# Patient Record
Sex: Female | Born: 1945 | Race: Black or African American | Hispanic: No | Smoking: Never smoker
Health system: Southern US, Community
[De-identification: ages and names within clinical notes are randomized; demographics above are authoritative.]

## PROBLEM LIST (undated history)

## (undated) DIAGNOSIS — R000 Tachycardia, unspecified: Secondary | ICD-10-CM

## (undated) DIAGNOSIS — E785 Hyperlipidemia, unspecified: Secondary | ICD-10-CM

## (undated) DIAGNOSIS — Z789 Other specified health status: Secondary | ICD-10-CM

## (undated) DIAGNOSIS — H9319 Tinnitus, unspecified ear: Secondary | ICD-10-CM

## (undated) DIAGNOSIS — F4310 Post-traumatic stress disorder, unspecified: Secondary | ICD-10-CM

## (undated) DIAGNOSIS — I10 Essential (primary) hypertension: Secondary | ICD-10-CM

## (undated) DIAGNOSIS — M8580 Other specified disorders of bone density and structure, unspecified site: Secondary | ICD-10-CM

## (undated) DIAGNOSIS — M1990 Unspecified osteoarthritis, unspecified site: Secondary | ICD-10-CM

## (undated) HISTORY — DX: Hyperlipidemia, unspecified: E78.5

## (undated) HISTORY — PX: BUNIONECTOMY: SHX129

## (undated) HISTORY — PX: ABDOMINAL HYSTERECTOMY: SHX81

## (undated) HISTORY — PX: JOINT REPLACEMENT: SHX530

## (undated) HISTORY — PX: CHOLECYSTECTOMY: SHX55

## (undated) HISTORY — DX: Post-traumatic stress disorder, unspecified: F43.10

---

## 1998-04-07 ENCOUNTER — Encounter: Payer: Self-pay | Admitting: Obstetrics and Gynecology

## 1998-04-07 ENCOUNTER — Other Ambulatory Visit: Admission: RE | Admit: 1998-04-07 | Discharge: 1998-04-07 | Payer: Self-pay | Admitting: Obstetrics and Gynecology

## 1998-04-07 ENCOUNTER — Ambulatory Visit (HOSPITAL_COMMUNITY): Admission: RE | Admit: 1998-04-07 | Discharge: 1998-04-07 | Payer: Self-pay | Admitting: *Deleted

## 1998-05-07 ENCOUNTER — Ambulatory Visit (HOSPITAL_COMMUNITY): Admission: RE | Admit: 1998-05-07 | Discharge: 1998-05-07 | Payer: Self-pay | Admitting: Obstetrics and Gynecology

## 1999-03-02 ENCOUNTER — Encounter: Payer: Self-pay | Admitting: Obstetrics and Gynecology

## 1999-03-07 ENCOUNTER — Encounter (INDEPENDENT_AMBULATORY_CARE_PROVIDER_SITE_OTHER): Payer: Self-pay

## 1999-03-07 ENCOUNTER — Inpatient Hospital Stay (HOSPITAL_COMMUNITY): Admission: RE | Admit: 1999-03-07 | Discharge: 1999-03-09 | Payer: Self-pay | Admitting: Obstetrics and Gynecology

## 1999-04-18 ENCOUNTER — Encounter: Payer: Self-pay | Admitting: Obstetrics and Gynecology

## 1999-04-18 ENCOUNTER — Ambulatory Visit (HOSPITAL_COMMUNITY): Admission: RE | Admit: 1999-04-18 | Discharge: 1999-04-18 | Payer: Self-pay | Admitting: Obstetrics and Gynecology

## 2000-05-13 ENCOUNTER — Ambulatory Visit (HOSPITAL_COMMUNITY): Admission: RE | Admit: 2000-05-13 | Discharge: 2000-05-13 | Payer: Self-pay | Admitting: Obstetrics and Gynecology

## 2000-05-13 ENCOUNTER — Encounter: Payer: Self-pay | Admitting: Obstetrics and Gynecology

## 2001-01-13 ENCOUNTER — Encounter (INDEPENDENT_AMBULATORY_CARE_PROVIDER_SITE_OTHER): Payer: Self-pay

## 2001-01-13 ENCOUNTER — Ambulatory Visit (HOSPITAL_COMMUNITY): Admission: RE | Admit: 2001-01-13 | Discharge: 2001-01-13 | Payer: Self-pay | Admitting: Gastroenterology

## 2001-05-15 ENCOUNTER — Encounter: Payer: Self-pay | Admitting: Obstetrics and Gynecology

## 2001-05-15 ENCOUNTER — Ambulatory Visit (HOSPITAL_COMMUNITY): Admission: RE | Admit: 2001-05-15 | Discharge: 2001-05-15 | Payer: Self-pay | Admitting: Obstetrics and Gynecology

## 2001-09-17 ENCOUNTER — Other Ambulatory Visit: Admission: RE | Admit: 2001-09-17 | Discharge: 2001-09-17 | Payer: Self-pay | Admitting: Obstetrics and Gynecology

## 2002-08-26 ENCOUNTER — Ambulatory Visit (HOSPITAL_COMMUNITY): Admission: RE | Admit: 2002-08-26 | Discharge: 2002-08-26 | Payer: Self-pay | Admitting: Obstetrics and Gynecology

## 2002-08-26 ENCOUNTER — Encounter: Payer: Self-pay | Admitting: Obstetrics and Gynecology

## 2002-09-21 ENCOUNTER — Other Ambulatory Visit: Admission: RE | Admit: 2002-09-21 | Discharge: 2002-09-21 | Payer: Self-pay | Admitting: Obstetrics and Gynecology

## 2003-09-21 ENCOUNTER — Ambulatory Visit (HOSPITAL_COMMUNITY): Admission: RE | Admit: 2003-09-21 | Discharge: 2003-09-21 | Payer: Self-pay | Admitting: Obstetrics and Gynecology

## 2003-10-01 ENCOUNTER — Other Ambulatory Visit: Admission: RE | Admit: 2003-10-01 | Discharge: 2003-10-01 | Payer: Self-pay | Admitting: Obstetrics and Gynecology

## 2004-10-05 ENCOUNTER — Ambulatory Visit (HOSPITAL_COMMUNITY): Admission: RE | Admit: 2004-10-05 | Discharge: 2004-10-05 | Payer: Self-pay | Admitting: Obstetrics and Gynecology

## 2005-11-14 ENCOUNTER — Ambulatory Visit (HOSPITAL_COMMUNITY): Admission: RE | Admit: 2005-11-14 | Discharge: 2005-11-14 | Payer: Self-pay | Admitting: Obstetrics and Gynecology

## 2006-11-18 ENCOUNTER — Ambulatory Visit (HOSPITAL_COMMUNITY): Admission: RE | Admit: 2006-11-18 | Discharge: 2006-11-18 | Payer: Self-pay | Admitting: Obstetrics and Gynecology

## 2007-02-17 ENCOUNTER — Encounter: Admission: RE | Admit: 2007-02-17 | Discharge: 2007-02-17 | Payer: Self-pay | Admitting: Obstetrics and Gynecology

## 2007-12-03 ENCOUNTER — Ambulatory Visit (HOSPITAL_COMMUNITY): Admission: RE | Admit: 2007-12-03 | Discharge: 2007-12-03 | Payer: Self-pay | Admitting: Obstetrics and Gynecology

## 2008-12-03 ENCOUNTER — Ambulatory Visit (HOSPITAL_COMMUNITY): Admission: RE | Admit: 2008-12-03 | Discharge: 2008-12-03 | Payer: Self-pay | Admitting: Obstetrics and Gynecology

## 2009-12-13 ENCOUNTER — Ambulatory Visit (HOSPITAL_COMMUNITY): Admission: RE | Admit: 2009-12-13 | Discharge: 2009-12-13 | Payer: Self-pay | Admitting: Obstetrics and Gynecology

## 2010-09-15 NOTE — Procedures (Signed)
[facility]  Patient:    Kimberly Mccullough, Kimberly Mccullough [facility] Visit Number: [id number] MRN: [id number]          Service Type: END Location: ENDO Attending Physician:  Orland Mustard Proc. Date: 01/13/01 Admit Date:  01/13/2001   CC:         Lenoard Aden, M.D.   Procedure Report  PROCEDURE:  Colonoscopy with coagulation of polyp.  MEDICATIONS:  Fentanyl 100 mcg, Versed 9 mg IV.  INDICATION:  Change in bowel habits in a 65 year old woman with no family history of colon cancer and is due for colonoscopy screening.  DESCRIPTION OF PROCEDURE:  The procedure had been explained to the patient and consent obtained.  With the patient in the left lateral decubitus position, a digital exam was performed, and the pediatric video colonoscope was inserted and advanced under direct visualization.  The prep was excellent.  The patient had an extremely long, tortuous colon.  Fortunately, we had the pediatric scope with the variable-resistance and eventually using abdominal pressure and position changes, we able to advance to the cecum.  The ileocecal valve and appendiceal orifice were seen.  The scope was withdrawn.  The cecum, ascending colon, hepatic flexure, transverse colon, splenic flexure, descending, and sigmoid colon were seen well upon removal.  They were free of diverticula, polyps, and any other lesions.  In the rectum, a 3 mm sessile polyp was encountered and was cauterized with the hot biopsy forceps.  No other polyps were seen.  The scope was withdrawn.  The patient tolerated the procedure well and was maintained on low-flow oxygen and pulse oximeter throughout the procedure.  ASSESSMENT: 1. Rectal polyp, cauterized. 2. Long, tortuous colon.  PLAN:  We will see back in the office in two months and depending on the pathology, may need to repeat a colonoscopy in the future. Attending Physician:  Orland Mustard DD:  01/13/01 TD:   01/13/01 Job: [id number]

## 2010-12-05 ENCOUNTER — Other Ambulatory Visit (HOSPITAL_COMMUNITY): Payer: Self-pay | Admitting: Obstetrics and Gynecology

## 2010-12-05 DIAGNOSIS — Z1231 Encounter for screening mammogram for malignant neoplasm of breast: Secondary | ICD-10-CM

## 2010-12-19 ENCOUNTER — Ambulatory Visit (HOSPITAL_COMMUNITY)
Admission: RE | Admit: 2010-12-19 | Discharge: 2010-12-19 | Disposition: A | Discharge status: Home / Self Care | Payer: BC Managed Care – PPO | Source: Ambulatory Visit | Attending: Obstetrics and Gynecology | Admitting: Obstetrics and Gynecology

## 2010-12-19 DIAGNOSIS — Z1231 Encounter for screening mammogram for malignant neoplasm of breast: Secondary | ICD-10-CM

## 2012-01-01 ENCOUNTER — Other Ambulatory Visit (HOSPITAL_COMMUNITY): Payer: Self-pay | Admitting: Obstetrics and Gynecology

## 2012-01-01 DIAGNOSIS — Z1231 Encounter for screening mammogram for malignant neoplasm of breast: Secondary | ICD-10-CM

## 2012-01-11 ENCOUNTER — Ambulatory Visit (HOSPITAL_COMMUNITY)
Admission: RE | Admit: 2012-01-11 | Discharge: 2012-01-11 | Disposition: A | Discharge status: Home / Self Care | Payer: Medicare Other | Source: Ambulatory Visit | Attending: Obstetrics and Gynecology | Admitting: Obstetrics and Gynecology

## 2012-01-11 DIAGNOSIS — Z1231 Encounter for screening mammogram for malignant neoplasm of breast: Secondary | ICD-10-CM | POA: Insufficient documentation

## 2012-01-23 DIAGNOSIS — Z23 Encounter for immunization: Secondary | ICD-10-CM

## 2012-02-21 ENCOUNTER — Ambulatory Visit: Payer: Medicare Other

## 2012-03-11 ENCOUNTER — Encounter: Payer: Self-pay | Admitting: Cardiovascular Disease

## 2012-03-11 ENCOUNTER — Ambulatory Visit (INDEPENDENT_AMBULATORY_CARE_PROVIDER_SITE_OTHER): Payer: Self-pay | Admitting: Cardiovascular Disease

## 2012-03-11 VITALS — BP 122/84 | HR 65 | Ht 70.0 in | Wt 169.0 lb

## 2012-03-11 DIAGNOSIS — I491 Atrial premature depolarization: Secondary | ICD-10-CM

## 2012-03-11 DIAGNOSIS — R002 Palpitations: Secondary | ICD-10-CM | POA: Insufficient documentation

## 2012-03-11 LAB — BASIC METABOLIC PANEL
BUN: 15 mg/dL (ref 6–23)
CO2: 21 mEq/L (ref 19–32)
Calcium: 9.3 mg/dL (ref 8.4–10.5)
Chloride: 108 mEq/L (ref 96–112)
GFR: 89.86 mL/min (ref >60.00)
Glucose, Bld: 91 mg/dL (ref 70–99)
Potassium: 4.2 mEq/L (ref 3.5–5.1)

## 2012-03-11 NOTE — Patient Instructions (Addendum)
Your physician recommends that you schedule a follow-up appointment in:  4 weeks.   Your physician has requested that you have an echocardiogram. Echocardiography is a painless test that uses sound waves to create images of your heart. It provides your doctor with information about the size and shape of your heart and how well your heart's chambers and valves are working. This procedure takes approximately one hour. There are no restrictions for this procedure.   Your physician has recommended that you wear a holter monitor. Holter monitors are medical devices that record the heart's electrical activity. Doctors most often use these monitors to diagnose arrhythmias. Arrhythmias are problems with the speed or rhythm of the heartbeat. The monitor is a small, portable device. You can wear one while you do your normal daily activities. This is usually used to diagnose what is causing palpitations/syncope (passing out).  . 

## 2012-03-11 NOTE — Progress Notes (Signed)
   History of Present Illness: 66 yo female with history of HTN, HLD, PTSD here today for new cardiology visit. She has a history of palpitations and these are worsened recently after the death of her husband. She tells me that she has been having "heart pounding" with skipped heart beats for the last 3 months. She notices this every day. She drinks 7 cups of coffee per day. She had been on Lipitor in past but had joint aches. Lipids at last check ok per pt. No chest pain or SOB. She lost her husband 3 weeks ago. He had CAD/history of CVA and was followed by Hochrein. He died suddenly. She saw him die at home and she has been under much stress.   Primary Care Physician: Joylene John Ameen Gyn: Olivia Mackie   Past Medical History  Diagnosis Date  . HTN (hypertension)   . Hyperlipidemia     Past Surgical History  Procedure Date  . Abdominal hysterectomy   . Cholecystectomy     Current Outpatient Prescriptions  Medication Sig Dispense Refill  . ALPRAZolam (XANAX) 0.25 MG tablet As needed      . meloxicam (MOBIC) 15 MG tablet As needed        No Known Allergies  History   Social History  . Marital Status: Widowed    Spouse Name: N/A    Number of Children: 3  . Years of Education: N/A   Occupational History  . Legal assistant    Social History Main Topics  . Smoking status: Never Smoker   . Smokeless tobacco: Not on file  . Alcohol Use: No  . Drug Use: No  . Sexually Active: Not on file   Other Topics Concern  . Not on file   Social History Narrative  . No narrative on file    Family History  Problem Relation Age of Onset  . Dementia Mother   . Liver disease Father   . CAD Neg Hx     Review of Systems:  As stated in the HPI and otherwise negative.   BP 122/84  Pulse 65  Ht 5\' 10"  (1.778 m)  Wt 169 lb (76.658 kg)  BMI 24.25 kg/m2  Physical Examination: General: Well developed, well nourished, NAD HEENT: OP clear, mucus membranes moist SKIN: warm, dry.  No rashes. Neuro: No focal deficits Musculoskeletal: Muscle strength 5/5 all ext Psychiatric: Mood and affect normal Neck: No JVD, no carotid bruits, no thyromegaly, no lymphadenopathy. Lungs:Clear bilaterally, no wheezes, rhonci, crackles Cardiovascular: Regular rate and rhythm with ectopy. No murmurs, gallops or rubs. Abdomen:Soft. Bowel sounds present. Non-tender.  Extremities: No lower extremity edema. Pulses are 2 + in the bilateral DP/PT.  EKG: NSR, PACs in pattern of bigeminy.   Assessment and Plan:   1. Palpitations: PACs on EKG today. This likely explains her symptoms. Will arrange echo to assess LVEF and exclude structural heart disease. Will arrange 48 hour monitor. Will check BMET and TSH today.

## 2012-03-13 ENCOUNTER — Telehealth: Payer: Self-pay | Admitting: Cardiovascular Disease

## 2012-03-13 NOTE — Telephone Encounter (Signed)
Pt rtn pat's call

## 2012-03-13 NOTE — Telephone Encounter (Signed)
Spoke with pt and reviewed recent lab results with her.  

## 2012-03-18 ENCOUNTER — Ambulatory Visit (HOSPITAL_COMMUNITY): Payer: Medicare Other | Attending: Cardiovascular Disease

## 2012-03-18 ENCOUNTER — Encounter (INDEPENDENT_AMBULATORY_CARE_PROVIDER_SITE_OTHER): Payer: Medicare Other

## 2012-03-18 DIAGNOSIS — I10 Essential (primary) hypertension: Secondary | ICD-10-CM | POA: Insufficient documentation

## 2012-03-18 DIAGNOSIS — R002 Palpitations: Secondary | ICD-10-CM | POA: Insufficient documentation

## 2012-03-18 DIAGNOSIS — I491 Atrial premature depolarization: Secondary | ICD-10-CM

## 2012-03-18 DIAGNOSIS — E785 Hyperlipidemia, unspecified: Secondary | ICD-10-CM | POA: Insufficient documentation

## 2012-03-18 DIAGNOSIS — R9431 Abnormal electrocardiogram [ECG] [EKG]: Secondary | ICD-10-CM

## 2012-03-18 NOTE — Progress Notes (Signed)
Echocardiogram performed.  

## 2012-03-20 ENCOUNTER — Telehealth: Payer: Self-pay | Admitting: Cardiovascular Disease

## 2012-03-20 NOTE — Telephone Encounter (Signed)
Spoke with pt, aware of echo results. 

## 2012-03-20 NOTE — Telephone Encounter (Signed)
Pt rtning call to [facility] from yesterday not sure why

## 2012-04-08 ENCOUNTER — Encounter: Payer: Self-pay | Admitting: Cardiovascular Disease

## 2012-04-08 ENCOUNTER — Ambulatory Visit (INDEPENDENT_AMBULATORY_CARE_PROVIDER_SITE_OTHER): Payer: Medicare Other | Admitting: Cardiovascular Disease

## 2012-04-08 VITALS — BP 128/82 | HR 100 | Ht 70.0 in | Wt 165.0 lb

## 2012-04-08 DIAGNOSIS — R002 Palpitations: Secondary | ICD-10-CM

## 2012-04-08 NOTE — Patient Instructions (Addendum)
Your physician wants you to follow-up in:  6 months. You will receive a reminder letter in the mail two months in advance. If you don't receive a letter, please call our office to schedule the follow-up appointment.   

## 2012-04-08 NOTE — Progress Notes (Signed)
History of Present Illness: 66 yo female with history of HTN, HLD, PTSD here today for cardiology follow up. I saw her as a new patient 66 03/11/12 for evaluation of palpitations. She has a history of palpitations and these are worsened recently after the death of her husband. She told me that she has been having "heart pounding" with skipped heart beats for the last 3 months. She notices this every day. She drinks 7 cups of coffee per day. She had been on Lipitor in past but had joint aches. Lipids at last check ok per pt. No chest pain or SOB. She lost her husband In October 2013.  He had CAD/history of CVA and was followed by Hochrein. He died suddenly. She saw him die at home and she has been under much stress. EKG showed PACs.  I arranged an echo and a 48 hour monitor.   She is here today for follow up. She is feeling well. Occasional skipped beats. No other complaints.   Primary Care Physician: Joylene John Ameen  Gyn: Olivia Mackie   Past Medical History  Diagnosis Date  . HTN (hypertension)   . Hyperlipidemia     Past Surgical History  Procedure Date  . Abdominal hysterectomy   . Cholecystectomy     Current Outpatient Prescriptions  Medication Sig Dispense Refill  . ALPRAZolam (XANAX) 0.25 MG tablet As needed      . meloxicam (MOBIC) 15 MG tablet As needed        No Known Allergies  History   Social History  . Marital Status: Widowed    Spouse Name: N/A    Number of Children: 3  . Years of Education: N/A   Occupational History  . Legal assistant    Social History Main Topics  . Smoking status: Never Smoker   . Smokeless tobacco: Not on file  . Alcohol Use: No  . Drug Use: No  . Sexually Active: Not on file   Other Topics Concern  . Not on file   Social History Narrative  . No narrative on file    Family History  Problem Relation Age of Onset  . Dementia Mother   . Liver disease Father   . CAD Neg Hx     Review of Systems:  As stated in the HPI and  otherwise negative.   BP 148/90  Pulse 100  Ht 5\' 10"  (1.778 m)  Wt 165 lb (74.844 kg)  BMI 23.68 kg/m2  Physical Examination: General: Well developed, well nourished, NAD HEENT: OP clear, mucus membranes moist SKIN: warm, dry. No rashes. Neuro: No focal deficits Musculoskeletal: Muscle strength 5/5 all ext Psychiatric: Mood and affect normal Neck: No JVD, no carotid bruits, no thyromegaly, no lymphadenopathy. Lungs:Clear bilaterally, no wheezes, rhonci, crackles Cardiovascular: Regular rate and rhythm. No murmurs, gallops or rubs. Abdomen:Soft. Bowel sounds present. Non-tender.  Extremities: No lower extremity edema. Pulses are 2 + in the bilateral DP/PT.  Echo:  03/18/12: Left ventricle: The cavity size was normal. Wall thickness was normal. Systolic function was normal. The estimated ejection fraction was in the range of 55% to 60%. Wall motion was normal; there were no regional wall motion abnormalities. Doppler parameters are consistent with abnormal left ventricular relaxation (grade 1 diastolic dysfunction). - Aortic valve: There was no stenosis. - Mitral valve: Trivial regurgitation. - Right ventricle: The cavity size was normal. Systolic function was normal. - Tricuspid valve: Peak RV-RA gradient: 21mm Hg (S). - Pulmonary arteries: PA peak pressure: 26mm Hg (  S). - Inferior vena cava: The vessel was normal in size; the respirophasic diameter changes were in the normal range (= 50%); findings are consistent with normal central venous pressure. Impressions:  - Normal LV size and systolic function, EF 55-60%. Normal RV size and systolic function. No significant valvular disease.   Assessment and Plan:   1. Palpitations: PACs on EKG at first visit. TSH and BMET ok. Echo was normal as above. Monitor showed PACs and NSR.  I have discussed things that will exacerbate her symptoms such as caffeine and other stimulants. Will not start any medications. She will return in  6 months.

## 2012-04-10 ENCOUNTER — Telehealth: Payer: Self-pay | Admitting: *Deleted

## 2012-04-10 NOTE — Telephone Encounter (Signed)
48 Holter monitor report rec'd by Dennie Bible. TK

## 2012-11-14 ENCOUNTER — Ambulatory Visit (INDEPENDENT_AMBULATORY_CARE_PROVIDER_SITE_OTHER): Payer: Medicare Other | Admitting: Nurse Practitioner

## 2012-11-14 ENCOUNTER — Encounter: Payer: Self-pay | Admitting: Nurse Practitioner

## 2012-11-14 VITALS — BP 150/104 | HR 105 | Ht 70.0 in | Wt 169.4 lb

## 2012-11-14 DIAGNOSIS — R002 Palpitations: Secondary | ICD-10-CM

## 2012-11-14 DIAGNOSIS — I10 Essential (primary) hypertension: Secondary | ICD-10-CM

## 2012-11-14 DIAGNOSIS — R079 Chest pain, unspecified: Secondary | ICD-10-CM

## 2012-11-14 LAB — LIPID PANEL
Cholesterol: 222 mg/dL — ABNORMAL HIGH (ref 0–200)
HDL: 53.7 mg/dL (ref >39.00)
Total CHOL/HDL Ratio: 4
Triglycerides: 61 mg/dL (ref 0.0–149.0)
VLDL: 12.2 mg/dL (ref 0.0–40.0)

## 2012-11-14 LAB — BASIC METABOLIC PANEL
BUN: 16 mg/dL (ref 6–23)
CO2: 24 mEq/L (ref 19–32)
Calcium: 9.4 mg/dL (ref 8.4–10.5)
Chloride: 108 mEq/L (ref 96–112)
Creatinine, Ser: 0.9 mg/dL (ref 0.4–1.2)
GFR: 79.52 mL/min (ref >60.00)
Glucose, Bld: 108 mg/dL — ABNORMAL HIGH (ref 70–99)
Potassium: 3.9 mEq/L (ref 3.5–5.1)
Sodium: 139 mEq/L (ref 135–145)

## 2012-11-14 LAB — CBC WITH DIFFERENTIAL/PLATELET
Basophils Absolute: 0 10*3/uL (ref 0.0–0.1)
Basophils Relative: 0.8 % (ref 0.0–3.0)
Eosinophils Absolute: 0.1 10*3/uL (ref 0.0–0.7)
Eosinophils Relative: 1.2 % (ref 0.0–5.0)
HCT: 43.6 % (ref 36.0–46.0)
Hemoglobin: 14.8 g/dL (ref 12.0–15.0)
Lymphocytes Relative: 45.3 % (ref 12.0–46.0)
Lymphs Abs: 1.9 10*3/uL (ref 0.7–4.0)
MCHC: 34 g/dL (ref 30.0–36.0)
MCV: 86.9 fl (ref 78.0–100.0)
Monocytes Absolute: 0.4 10*3/uL (ref 0.1–1.0)
Monocytes Relative: 9.9 % (ref 3.0–12.0)
Neutro Abs: 1.8 10*3/uL (ref 1.4–7.7)
Neutrophils Relative %: 42.8 % — ABNORMAL LOW (ref 43.0–77.0)
Platelets: 235 10*3/uL (ref 150.0–400.0)
RBC: 5.02 Mil/uL (ref 3.87–5.11)
RDW: 12.8 % (ref 11.5–14.6)
WBC: 4.1 10*3/uL — ABNORMAL LOW (ref 4.5–10.5)

## 2012-11-14 LAB — HEPATIC FUNCTION PANEL
ALT: 18 U/L (ref 0–35)
AST: 23 U/L (ref 0–37)
Albumin: 4.2 g/dL (ref 3.5–5.2)
Alkaline Phosphatase: 64 U/L (ref 39–117)
Bilirubin, Direct: 0 mg/dL (ref 0.0–0.3)
Total Bilirubin: 0.6 mg/dL (ref 0.3–1.2)
Total Protein: 6.9 g/dL (ref 6.0–8.3)

## 2012-11-14 LAB — LDL CHOLESTEROL, DIRECT: Direct LDL: 167.1 mg/dL

## 2012-11-14 LAB — TSH: TSH: 1.37 u[IU]/mL (ref 0.35–5.50)

## 2012-11-14 MED ORDER — METOPROLOL TARTRATE 25 MG PO TABS: 25.0000 mg | ORAL_TABLET | Freq: Two times a day (BID) | ORAL | Status: DC

## 2012-11-14 MED ORDER — MELOXICAM 15 MG PO TABS: 15.0000 mg | ORAL_TABLET | Freq: Every day | ORAL | Status: DC | PRN

## 2012-11-14 NOTE — Progress Notes (Signed)
Valora Piccolo Rayl Date of Birth: [birth year] Medical Record #[id number]  History of Present Illness: Ms. Samano is seen back today for her 6 month check. Seen for Dr. Clifton James. She has a history of HTN, HLD and PTSD as well as palpitations.   Last seen here in December and was doing ok. Prior to that was having more palpitations - felt to be stress related and from caffeine (previously drank 7 cups of coffee per day) - she saw her husband die at home.   Comes in today. Here alone. Not doing well. Continues to have palpitations. Was drinking her 7 cups of coffee up until last week. Had a spell last week while out in the heat where she felt like she was going to faint. Chest felt tight. She has had indigestion. Heart beating hard and fast. Had to go sit/lie down and then felt better. Still with some indigestion and belching. But now trying to eat and do "the right things". Fatigues easily. Very anxious about possibility of dying. She explained to me the circumstances with her husband - she still has considerable guilt that she did not do enough. Still on Mobic as well. BP has been up recently.   Current Outpatient Prescriptions  Medication Sig Dispense Refill  . calcium carbonate (OS-CAL) 600 MG TABS Take 600 mg by mouth 2 (two) times daily with a meal.      . meloxicam (MOBIC) 15 MG tablet Take 1 tablet (15 mg total) by mouth daily as needed for pain.      . Misc Natural Products (GLUCOSAMINE CHOND COMPLEX/MSM) TABS Take 3,000 mg by mouth daily.      . Omega 3-6-9 Fatty Acids (OMEGA-3 & OMEGA-6 FISH OIL PO) Take 1,000 mg by mouth daily.      . vitamin E 400 UNIT capsule Take 400 Units by mouth daily.      . metoprolol tartrate (LOPRESSOR) 25 MG tablet Take 1 tablet (25 mg total) by mouth 2 (two) times daily.  60 tablet  3   No current facility-administered medications for this visit.    No Known Allergies  Past Medical History  Diagnosis Date  . HTN (hypertension)   . Hyperlipidemia    . PTSD (post-traumatic stress disorder)     Past Surgical History  Procedure Laterality Date  . Abdominal hysterectomy    . Cholecystectomy      History  Smoking status  . Never Smoker   Smokeless tobacco  . Not on file    History  Alcohol Use No    Family History  Problem Relation Age of Onset  . Dementia Mother   . Liver disease Father   . CAD Neg Hx     Review of Systems: The review of systems is per the HPI.  All other systems were reviewed and are negative.  Physical Exam: BP 150/104  Pulse 105  Ht 5\' 10"  (1.778 m)  Wt 169 lb 6.4 oz (76.839 kg)  BMI 24.31 kg/m2  SpO2 100% Patient is very pleasant and in no acute distress. She is tearful. She is anxious. Skin is warm and dry. Color is normal.  HEENT is unremarkable. Normocephalic/atraumatic. PERRL. Sclera are nonicteric. Neck is supple. No masses. No JVD. Lungs are clear. Cardiac exam shows a regular rate and rhythm although it is fast. Abdomen is soft. Extremities are without edema. Gait and ROM are intact. No gross neurologic deficits noted.  LABORATORY DATA: EKG today shows sinus tach.   Lab Results  Component Value Date   GLUCOSE 91 03/11/2012   NA 141 03/11/2012   K 4.2 03/11/2012   CL 108 03/11/2012   CREATININE 0.8 03/11/2012   BUN 15 03/11/2012   CO2 21 03/11/2012   TSH 1.61 03/11/2012     Assessment / Plan: 1. HTN - BP by me is still elevated.   2. HLD - recheck labs today  3. Palpitations - adding Lopressor 25 mg BID. This may help with her BP and her palpitations.   4. Atypical chest pain - will arrange for stress echo and add PPI. Beta blocker added as well. I have asked her to limit her use of Mobic.  5. Situational stress - I think this is the key issue - she has significant guilt about her husband's death - she is encouraged to talk to her clergy/counselor. We had a nice talk about this today.   I will see her back in 2 to 3 weeks. She is to monitor her BP at home.   Patient is  agreeable to this plan and will call if any problems develop in the interim.   Rosalio Macadamia, RN, ANP-C [facility] [address] [place], [place]  [address]

## 2012-11-14 NOTE — Patient Instructions (Addendum)
Use the Mobic only as needed  I am adding Lopressor 25 mg to take two times a day - this is for your heart rate and your BP  Get either Nexium or OTC Prilosec and take one a day until I see you back  We will arrange for a stress echocardiogram  We are checking labs today  Think about what we talked about today.   I will see you back in 2 to 3 weeks  Stay on track with your diet   Call the [facility] office at [phone] if you have any questions, problems or concerns.

## 2012-11-18 ENCOUNTER — Telehealth: Payer: Self-pay | Admitting: Nurse Practitioner

## 2012-11-18 NOTE — Telephone Encounter (Signed)
Follow up  Pt would like to speak with you, she states she talked with you the other day.

## 2012-11-18 NOTE — Telephone Encounter (Signed)
S/w pt wanted to make sure Zantac 150 mg was ok to take instead of nexium or prilosec Lawson Fiscal stated that would be fine. Also wanted to know about eating at 12 since gxt was at 3 Lawson Fiscal stated the office should be calling with directions in that department the night before and Lawson Fiscal stated eat lite

## 2012-11-20 ENCOUNTER — Other Ambulatory Visit (HOSPITAL_COMMUNITY): Payer: Self-pay | Admitting: Radiology

## 2012-11-20 ENCOUNTER — Other Ambulatory Visit (HOSPITAL_COMMUNITY): Payer: Medicare Other

## 2012-11-20 ENCOUNTER — Ambulatory Visit (HOSPITAL_BASED_OUTPATIENT_CLINIC_OR_DEPARTMENT_OTHER): Payer: Medicare Other

## 2012-11-20 ENCOUNTER — Ambulatory Visit (HOSPITAL_COMMUNITY): Payer: Medicare Other | Attending: Nurse Practitioner | Admitting: Radiology

## 2012-11-20 ENCOUNTER — Encounter: Payer: Self-pay | Admitting: Nurse Practitioner

## 2012-11-20 DIAGNOSIS — R0989 Other specified symptoms and signs involving the circulatory and respiratory systems: Secondary | ICD-10-CM

## 2012-11-20 DIAGNOSIS — E785 Hyperlipidemia, unspecified: Secondary | ICD-10-CM | POA: Insufficient documentation

## 2012-11-20 DIAGNOSIS — I10 Essential (primary) hypertension: Secondary | ICD-10-CM

## 2012-11-20 DIAGNOSIS — R079 Chest pain, unspecified: Secondary | ICD-10-CM

## 2012-11-20 DIAGNOSIS — R42 Dizziness and giddiness: Secondary | ICD-10-CM | POA: Insufficient documentation

## 2012-11-20 DIAGNOSIS — R072 Precordial pain: Secondary | ICD-10-CM | POA: Insufficient documentation

## 2012-11-20 DIAGNOSIS — R5381 Other malaise: Secondary | ICD-10-CM | POA: Insufficient documentation

## 2012-11-20 DIAGNOSIS — R5383 Other fatigue: Secondary | ICD-10-CM | POA: Insufficient documentation

## 2012-11-20 DIAGNOSIS — R002 Palpitations: Secondary | ICD-10-CM

## 2012-11-20 DIAGNOSIS — R0609 Other forms of dyspnea: Secondary | ICD-10-CM | POA: Insufficient documentation

## 2012-11-20 NOTE — Progress Notes (Signed)
Stress Echocardiogram performed.  

## 2012-12-05 ENCOUNTER — Encounter: Payer: Self-pay | Admitting: Nurse Practitioner

## 2012-12-05 ENCOUNTER — Ambulatory Visit (INDEPENDENT_AMBULATORY_CARE_PROVIDER_SITE_OTHER): Payer: Medicare Other | Admitting: Nurse Practitioner

## 2012-12-05 VITALS — BP 120/90 | HR 71 | Ht 70.0 in | Wt 165.0 lb

## 2012-12-05 DIAGNOSIS — I10 Essential (primary) hypertension: Secondary | ICD-10-CM

## 2012-12-05 NOTE — Patient Instructions (Addendum)
Stay on your current medicines  Stay active  Keep a check on your blood pressure  I will see you in 4 months with fasting labs  Call the [facility] office at [phone] if you have any questions, problems or concerns.

## 2012-12-05 NOTE — Progress Notes (Signed)
Valora Piccolo Kopke Date of Birth: 06-11-1945 Medical Record #[id number]  History of Present Illness: Ms. Rendall is seen back today for a 3 week check. Seen for Dr. Clifton James. She has a history of HTN, HLD, PTSD and palpitations. She has lots of stress/anxiety associated with the death of her husband.   Seen 3 weeks ago with palpitations. Had been drinking up to 7 cups of coffee per day. Endorsed chest pain. BP was up. I stopped her Mobic, added Lopressor, OTC PPI therapy and arranged for a stress echo.   She comes back today. Here alone. Doing ok. She is feeling better. Has cut her coffee back to just one cup of caffinated per day. Has no more palpitations. BP is coming down. Her list of readings from home look much better and her cuff correlates fairly well. We have reviewed her stress echo. This was normal but with LVH. Reviewed her lipids - they are above goal. Very hesitant to take any additional medicine.    Current Outpatient Prescriptions  Medication Sig Dispense Refill  . calcium carbonate (OS-CAL) 600 MG TABS Take 600 mg by mouth 2 (two) times daily with a meal.      . meloxicam (MOBIC) 15 MG tablet Take 1 tablet (15 mg total) by mouth daily as needed for pain.      . metoprolol tartrate (LOPRESSOR) 25 MG tablet Take 1 tablet (25 mg total) by mouth 2 (two) times daily.  60 tablet  3  . Misc Natural Products (GLUCOSAMINE CHOND COMPLEX/MSM) TABS Take 3,000 mg by mouth daily.      . Omega 3-6-9 Fatty Acids (OMEGA-3 & OMEGA-6 FISH OIL PO) Take 1,000 mg by mouth daily.      . predniSONE (STERAPRED UNI-PAK) 10 MG tablet Take 10 mg by mouth daily. Dose pack two days left 12/05/12      . vitamin E 400 UNIT capsule Take 400 Units by mouth daily.       No current facility-administered medications for this visit.    No Known Allergies  Past Medical History  Diagnosis Date  . HTN (hypertension)   . Hyperlipidemia   . PTSD (post-traumatic stress disorder)     Past Surgical History    Procedure Laterality Date  . Abdominal hysterectomy    . Cholecystectomy      History  Smoking status  . Never Smoker   Smokeless tobacco  . Not on file    History  Alcohol Use No    Family History  Problem Relation Age of Onset  . Dementia Mother   . Liver disease Father   . CAD Neg Hx     Review of Systems: The review of systems is per the HPI.  All other systems were reviewed and are negative.  Physical Exam: BP 120/90  Pulse 71  Ht 5\' 10"  (1.778 m)  Wt 165 lb (74.844 kg)  BMI 23.68 kg/m2 Patient is very pleasant and in no acute distress. Skin is warm and dry. Color is normal.  HEENT is unremarkable. Normocephalic/atraumatic. PERRL. Sclera are nonicteric. Neck is supple. No masses. No JVD. Lungs are clear. Cardiac exam shows a regular rate and rhythm. Abdomen is soft. Extremities are without edema. Gait and ROM are intact. No gross neurologic deficits noted.  LABORATORY DATA:  Lab Results  Component Value Date   WBC 4.1* 11/14/2012   HGB 14.8 11/14/2012   HCT 43.6 11/14/2012   PLT 235.0 11/14/2012   GLUCOSE 108* 11/14/2012   CHOL 222*  11/14/2012   TRIG 61.0 11/14/2012   HDL 53.70 11/14/2012   LDLDIRECT 167.1 11/14/2012   ALT 18 11/14/2012   AST 23 11/14/2012   NA 139 11/14/2012   K 3.9 11/14/2012   CL 108 11/14/2012   CREATININE 0.9 11/14/2012   BUN 16 11/14/2012   CO2 24 11/14/2012   TSH 1.37 11/14/2012   Stress Echo Study Conclusions  - Stress ECG conclusions: There were no stress arrhythmias or conduction abnormalities. The stress ECG was negative for ischemia. - Staged echo: There was no echocardiographic evidence for stress-induced ischemia. 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 HTN - BP has improved. Her readings from home look good. Will leave her on her current regimen.   2. Palpitations - improved with beta blocker therapy.   3. Anxiety/stress - seems better today.   4. Chest pain - negative stress echo -  no recurrence  5. HLD - very reluctant to take statin. Wants to really work on her diet/exercise.  I will see her back in 4 months.   Patient is agreeable to this plan and will call if any problems develop in the interim.   Rosalio Macadamia, RN, ANP-C [facility] [address] [place], [place]  [address]

## 2012-12-31 ENCOUNTER — Other Ambulatory Visit (HOSPITAL_COMMUNITY): Payer: Self-pay | Admitting: Obstetrics and Gynecology

## 2012-12-31 DIAGNOSIS — Z1231 Encounter for screening mammogram for malignant neoplasm of breast: Secondary | ICD-10-CM

## 2013-01-12 ENCOUNTER — Ambulatory Visit (HOSPITAL_COMMUNITY)
Admission: RE | Admit: 2013-01-12 | Discharge: 2013-01-12 | Disposition: A | Discharge status: Home / Self Care | Payer: Medicare Other | Source: Ambulatory Visit | Attending: Obstetrics and Gynecology | Admitting: Obstetrics and Gynecology

## 2013-01-12 DIAGNOSIS — Z1231 Encounter for screening mammogram for malignant neoplasm of breast: Secondary | ICD-10-CM | POA: Insufficient documentation

## 2013-01-31 ENCOUNTER — Emergency Department (HOSPITAL_BASED_OUTPATIENT_CLINIC_OR_DEPARTMENT_OTHER)
Admission: EM | Admit: 2013-01-31 | Discharge: 2013-02-01 | Disposition: A | Discharge status: Home / Self Care | Payer: Medicare Other | Attending: Emergency Medicine | Admitting: Emergency Medicine

## 2013-01-31 ENCOUNTER — Encounter (HOSPITAL_BASED_OUTPATIENT_CLINIC_OR_DEPARTMENT_OTHER): Payer: Self-pay | Admitting: Emergency Medicine

## 2013-01-31 DIAGNOSIS — R040 Epistaxis: Secondary | ICD-10-CM | POA: Insufficient documentation

## 2013-01-31 DIAGNOSIS — Z8639 Personal history of other endocrine, nutritional and metabolic disease: Secondary | ICD-10-CM | POA: Insufficient documentation

## 2013-01-31 DIAGNOSIS — IMO0002 Reserved for concepts with insufficient information to code with codable children: Secondary | ICD-10-CM | POA: Insufficient documentation

## 2013-01-31 DIAGNOSIS — Z79899 Other long term (current) drug therapy: Secondary | ICD-10-CM | POA: Insufficient documentation

## 2013-01-31 DIAGNOSIS — I10 Essential (primary) hypertension: Secondary | ICD-10-CM | POA: Insufficient documentation

## 2013-01-31 DIAGNOSIS — Z862 Personal history of diseases of the blood and blood-forming organs and certain disorders involving the immune mechanism: Secondary | ICD-10-CM | POA: Insufficient documentation

## 2013-01-31 DIAGNOSIS — Z8659 Personal history of other mental and behavioral disorders: Secondary | ICD-10-CM | POA: Insufficient documentation

## 2013-01-31 LAB — CBC
HCT: 37.7 % (ref 36.0–46.0)
Hemoglobin: 13.1 g/dL (ref 12.0–15.0)
MCH: 29.6 pg (ref 26.0–34.0)
MCV: 85.3 fL (ref 78.0–100.0)
Platelets: 196 10*3/uL (ref 150–400)
WBC: 4.7 10*3/uL (ref 4.0–10.5)

## 2013-01-31 NOTE — ED Provider Notes (Signed)
CSN: [id number]     Arrival date & time 01/31/13  2239 History   First MD Initiated Contact with Patient 01/31/13 2300     This chart was scribed for Kimberly Chick, MD by Manuela Schwartz, ED scribe. This patient was seen in room MH10/MH10 and the patient's care was started at 2239.  Chief Complaint  Patient presents with  . Epistaxis   Patient is a 67 y.o. female presenting with nosebleeds. The history is provided by the patient. No language interpreter was used.  Epistaxis Location:  L nare Severity:  Moderate Duration:  1 hour Timing:  Constant Progression:  Unchanged Chronicity:  New Context: not anticoagulants, not aspirin use, not bleeding disorder, not foreign body and not trauma   Relieved by:  Nothing Worsened by:  Nothing tried Ineffective treatments:  None tried Associated symptoms: blood in oropharynx   Associated symptoms: no congestion, no cough, no fever, no sore throat and no syncope    HPI Comments: Kimberly Mccullough is a 67 y.o. female who presents to the Emergency Department complaining of epistaxis since 1 hour ago. She denies any trauma/direct blow to her nose. She passed a few blood clots from her nose PTA. She states a hx of nosebleeds in the past but denies any hx of hemophilia or blood thinner medicines. She denies any recent cold/congestion illness. She has a hx of HTN which is controlled w/medicine. No head or facial trauma, no easy bruising or other areas of bleeding  Past Medical History  Diagnosis Date  . HTN (hypertension)   . Hyperlipidemia   . PTSD (post-traumatic stress disorder)    Past Surgical History  Procedure Laterality Date  . Abdominal hysterectomy    . Cholecystectomy     Family History  Problem Relation Age of Onset  . Dementia Mother   . Liver disease Father   . CAD Neg Hx    History  Substance Use Topics  . Smoking status: Never Smoker   . Smokeless tobacco: Not on file  . Alcohol Use: No   OB History   Grav Para Term  Preterm Abortions TAB SAB Ect Mult Living                 Review of Systems  Constitutional: Negative for fever and chills.  HENT: Positive for nosebleeds. Negative for congestion, sore throat and neck pain.   Respiratory: Negative for cough and shortness of breath.   Cardiovascular: Negative for syncope.  Gastrointestinal: Negative for nausea, vomiting and abdominal pain.  Musculoskeletal: Negative for back pain.  Neurological: Negative for syncope and weakness.  All other systems reviewed and are negative.   A complete 10 system review of systems was obtained and all systems are negative except as noted in the HPI and PMH.   Allergies  Other  Home Medications   Current Outpatient Rx  Name  Route  Sig  Dispense  Refill  . Multiple Vitamin (MULTIVITAMIN) tablet   Oral   Take 1 tablet by mouth daily.         . calcium carbonate (OS-CAL) 600 MG TABS   Oral   Take 600 mg by mouth 2 (two) times daily with a meal.         . meloxicam (MOBIC) 15 MG tablet   Oral   Take 1 tablet (15 mg total) by mouth daily as needed for pain.         . metoprolol tartrate (LOPRESSOR) 25 MG tablet   Oral  Take 1 tablet (25 mg total) by mouth 2 (two) times daily.   60 tablet   3   . Misc Natural Products (GLUCOSAMINE CHOND COMPLEX/MSM) TABS   Oral   Take 3,000 mg by mouth daily.         . Omega 3-6-9 Fatty Acids (OMEGA-3 & OMEGA-6 FISH OIL PO)   Oral   Take 1,000 mg by mouth daily.         . predniSONE (STERAPRED UNI-PAK) 10 MG tablet   Oral   Take 10 mg by mouth daily. Dose pack two days left 12/05/12         . vitamin E 400 UNIT capsule   Oral   Take 400 Units by mouth daily.          Triage Vitals: BP 151/88  Pulse 103  SpO2 100% Physical Exam  Nursing note and vitals reviewed. Constitutional: She is oriented to person, place, and time. She appears well-developed and well-nourished. No distress.  HENT:  Head: Normocephalic and atraumatic.  Blood clot in her  left nare  Eyes: EOM are normal.  Neck: Neck supple. No tracheal deviation present.  Cardiovascular: Normal rate.   Pulmonary/Chest: Effort normal and breath sounds normal. No respiratory distress. She has no wheezes. She has no rales.  Abdominal: Soft. Bowel sounds are normal. She exhibits no distension. There is no tenderness.  Musculoskeletal: Normal range of motion.  Neurological: She is alert and oriented to person, place, and time.  Skin: Skin is warm and dry.  No bruising of her skin, no petechiae  Psychiatric: She has a normal mood and affect. Her behavior is normal.  note- CV- RRR, no murmur/gallop/rubs  ED Course  Procedures (including critical care time) DIAGNOSTIC STUDIES: Oxygen Saturation is 100% on room air, normal by my interpretation.    COORDINATION OF CARE: At 50 PM Discussed treatment plan with patient which includes blood work. Patient agrees.   1:54 AM pt has been observed for 3 hours and no further nosebleed after holding pressure at bridge of nose.  I have discussed nasal packing with her and she wants this only if it is absolutley necessary.  Labs Review Labs Reviewed  BASIC METABOLIC PANEL - Abnormal; Notable for the following:    Glucose, Bld 141 (*)    BUN 25 (*)    GFR calc non Af Amer 75 (*)    GFR calc Af Amer 87 (*)    All other components within normal limits  CBC   Imaging Review No results found.  MDM   1. Epistaxis     Pt presenting with epistaxis from left nare.  Bleeding stopped once direct pressure was applied.  She was observed for several hours without recurrence of bleeding.  Labs reassuring.  Discussed nasal packing, but as no further bleeding and patient hesitant to have packing placed this was not done.  Discharged with strict return precautions.  Pt agreeable with plan.   I personally performed the services described in this documentation, which was scribed in my presence. The recorded information has been reviewed and is  accurate.      Kimberly Chick, MD 02/01/13 [phone]

## 2013-01-31 NOTE — ED Notes (Signed)
HEENT cart to room 10

## 2013-01-31 NOTE — ED Notes (Signed)
Spontaneous nose bleed starting about 30-45 minutes before, denies injury, no anticoagulants

## 2013-02-01 LAB — BASIC METABOLIC PANEL
BUN: 25 mg/dL — ABNORMAL HIGH (ref 6–23)
CO2: 26 mEq/L (ref 19–32)
Chloride: 105 mEq/L (ref 96–112)
Creatinine, Ser: 0.8 mg/dL (ref 0.50–1.10)
Glucose, Bld: 141 mg/dL — ABNORMAL HIGH (ref 70–99)
Potassium: 3.7 mEq/L (ref 3.5–5.1)

## 2013-02-06 ENCOUNTER — Ambulatory Visit (INDEPENDENT_AMBULATORY_CARE_PROVIDER_SITE_OTHER): Payer: Medicare Other

## 2013-02-06 DIAGNOSIS — Z23 Encounter for immunization: Secondary | ICD-10-CM

## 2013-03-23 ENCOUNTER — Other Ambulatory Visit: Payer: Self-pay | Admitting: Nurse Practitioner

## 2013-04-06 ENCOUNTER — Ambulatory Visit: Payer: Medicare Other | Admitting: Nurse Practitioner

## 2013-04-20 ENCOUNTER — Encounter: Payer: Self-pay | Admitting: Nurse Practitioner

## 2013-04-20 ENCOUNTER — Ambulatory Visit (INDEPENDENT_AMBULATORY_CARE_PROVIDER_SITE_OTHER): Payer: Medicare Other | Admitting: Nurse Practitioner

## 2013-04-20 ENCOUNTER — Encounter (INDEPENDENT_AMBULATORY_CARE_PROVIDER_SITE_OTHER): Payer: Self-pay

## 2013-04-20 VITALS — BP 120/80 | HR 70 | Ht 70.0 in | Wt 173.8 lb

## 2013-04-20 DIAGNOSIS — I10 Essential (primary) hypertension: Secondary | ICD-10-CM

## 2013-04-20 DIAGNOSIS — R002 Palpitations: Secondary | ICD-10-CM

## 2013-04-20 NOTE — Progress Notes (Signed)
Kimberly Mccullough Date of Birth: 02/06/1946 Medical Record #[id number]  History of Present Illness: Kimberly Mccullough is seen back today for a 4 month check. Seen for Dr. Clifton James. She has HTN, HLD, PTSD and palpitations. Has had lots of stress/anxiety/guilt associated with death of her husband.   I saw her back in the summer with palpitations - I advised her to cut back on her caffeine (was drinking up to 7 cups of coffee per day). Stress echo looked ok except for LVH. She has been hesitant to try statin therapy.   Comes back today. Here alone. Doing ok. No chest pain. Not as active. Having more issues with her knee and hip - even has pain in her left groin from the hip. Getting some PT but no real improvement. Sees ortho in [facility]. No problems with her palpitations. Has cut back on her caffeine.  Current Outpatient Prescriptions  Medication Sig Dispense Refill  . calcium carbonate (OS-CAL) 600 MG TABS Take 600 mg by mouth 2 (two) times daily with a meal.      . meloxicam (MOBIC) 15 MG tablet Take 1 tablet (15 mg total) by mouth daily as needed for pain.      . metoprolol tartrate (LOPRESSOR) 25 MG tablet TAKE ONE TABLET BY MOUTH TWICE DAILY  60 tablet  3  . Misc Natural Products (GLUCOSAMINE CHOND COMPLEX/MSM) TABS Take 3,000 mg by mouth daily.      . Multiple Vitamin (MULTIVITAMIN) tablet Take 1 tablet by mouth daily.      . Omega 3-6-9 Fatty Acids (OMEGA-3 & OMEGA-6 FISH OIL PO) Take 1,000 mg by mouth daily.      . vitamin E 400 UNIT capsule Take 400 Units by mouth daily.       No current facility-administered medications for this visit.    Allergies  Allergen Reactions  . Other     Pt is Jehovas Witness and does not want any blood products    Past Medical History  Diagnosis Date  . HTN (hypertension)   . Hyperlipidemia   . PTSD (post-traumatic stress disorder)     Past Surgical History  Procedure Laterality Date  . Abdominal hysterectomy    . Cholecystectomy       History  Smoking status  . Never Smoker   Smokeless tobacco  . Not on file    History  Alcohol Use No    Family History  Problem Relation Age of Onset  . Dementia Mother   . Liver disease Father   . CAD Neg Hx     Review of Systems: The review of systems is per the HPI.  All other systems were reviewed and are negative.  Physical Exam: BP 120/80  Pulse 70  Ht 5\' 10"  (1.778 m)  Wt 173 lb 12.8 oz (78.835 kg)  BMI 24.94 kg/m2  SpO2 97% Patient is very pleasant and in no acute distress. Skin is warm and dry. Color is normal.  HEENT is unremarkable. Normocephalic/atraumatic. PERRL. Sclera are nonicteric. Neck is supple. No masses. No JVD. Lungs are clear. Cardiac exam shows a regular rate and rhythm. Abdomen is soft. Extremities are without edema. Gait and ROM are intact. No gross neurologic deficits noted.  Wt Readings from Last 3 Encounters:  04/20/13 173 lb 12.8 oz (78.835 kg)  12/05/12 165 lb (74.844 kg)  11/14/12 169 lb 6.4 oz (76.839 kg)    LABORATORY DATA:  Lab Results  Component Value Date   WBC 4.7 01/31/2013  HGB 13.1 01/31/2013   HCT 37.7 01/31/2013   PLT 196 01/31/2013   GLUCOSE 141* 01/31/2013   CHOL 222* 11/14/2012   TRIG 61.0 11/14/2012   HDL 53.70 11/14/2012   LDLDIRECT 167.1 11/14/2012   ALT 18 11/14/2012   AST 23 11/14/2012   NA 140 01/31/2013   K 3.7 01/31/2013   CL 105 01/31/2013   CREATININE 0.80 01/31/2013   BUN 25* 01/31/2013   CO2 26 01/31/2013   TSH 1.37 11/14/2012     Assessment / Plan:  1. HTN - BP looks fine. I have left her on her current regimen.  2. Palpitations - pretty quite - no change in therapy.   3. HLD - not really interested in statin therapy.  See her back in a year. Her cardiac status looks stable to me. Needs to maintain her mobility for long term prognosis.  Patient is agreeable to this plan and will call if any problems develop in the interim.   Rosalio Macadamia, RN, ANP-C [facility] [address] [place], [place]  [address]

## 2013-04-20 NOTE — Patient Instructions (Addendum)
Stay on your current medicines  Stay active  See Dr. Clifton James in one year  Call the [facility] office at [phone] if you have any questions, problems or concerns.

## 2013-06-24 ENCOUNTER — Other Ambulatory Visit: Payer: Self-pay | Admitting: Nurse Practitioner

## 2013-08-20 ENCOUNTER — Telehealth: Payer: Self-pay | Admitting: Cardiovascular Disease

## 2013-08-20 NOTE — Telephone Encounter (Signed)
Received request from Nurse fax box, documents faxed for surgical clearance. To: [facility] Fax number: [phone] Attention: 4.23.15/kdm

## 2013-10-22 ENCOUNTER — Encounter: Payer: Self-pay | Admitting: Cardiology

## 2013-10-22 ENCOUNTER — Ambulatory Visit (INDEPENDENT_AMBULATORY_CARE_PROVIDER_SITE_OTHER): Payer: Medicare Other | Admitting: Cardiology

## 2013-10-22 ENCOUNTER — Telehealth: Payer: Self-pay | Admitting: Cardiovascular Disease

## 2013-10-22 VITALS — BP 137/92 | HR 68 | Wt 180.0 lb

## 2013-10-22 DIAGNOSIS — R5383 Other fatigue: Secondary | ICD-10-CM

## 2013-10-22 DIAGNOSIS — R5382 Chronic fatigue, unspecified: Secondary | ICD-10-CM

## 2013-10-22 DIAGNOSIS — I4949 Other premature depolarization: Secondary | ICD-10-CM

## 2013-10-22 DIAGNOSIS — R002 Palpitations: Secondary | ICD-10-CM

## 2013-10-22 DIAGNOSIS — R5381 Other malaise: Secondary | ICD-10-CM

## 2013-10-22 DIAGNOSIS — I491 Atrial premature depolarization: Secondary | ICD-10-CM

## 2013-10-22 DIAGNOSIS — I493 Ventricular premature depolarization: Secondary | ICD-10-CM

## 2013-10-22 NOTE — Progress Notes (Signed)
CARDIOLOGY OFFICE NOTE   Patient ID: Kimberly Mccullough MRN: [id number], DOB/AGE: [birth year]   Date of Visit: 10/22/2013  Primary Physician: Garth SchlatterAMEEN, WILLIAM OTIS, MD Primary Cardiologist: Clifton JamesMcAlhany, MD Reason for Visit: Follow-up for palpitations  History of Present Illness  Kimberly Mccullough is a 68 y.o. female with HTN, dyslipidemia, PTSD and symptomatic PACs/PVCs who presents today for routine 2743-month followup. Since last being seen in our clinic, she reports she is doing well from a cardiac standpoint. She has no complaints other then hip pain and fatigue. She is scheduled to undergo hip replacement surgery in July. Dr. Clifton JamesMcAlhany has already completed her surgical clearance forms. Today she reports constant fatigue and she questions whether or not it is related to her recent inactivity due to hip pain. She denies chest pain or shortness of breath. She denies palpitations, dizziness, near syncope or syncope. She denies LE swelling, orthopnea or PND. She is compliant with medications and denies any recent changes.  Past Medical History Past Medical History  Diagnosis Date  . HTN (hypertension)   . Hyperlipidemia   . PTSD (post-traumatic stress disorder)     Past Surgical History Past Surgical History  Procedure Laterality Date  . Abdominal hysterectomy    . Cholecystectomy      Allergies/Intolerances Allergies  Allergen Reactions  . Other     Pt is Jehovas Witness and does not want any blood products    Current Home Medications Current Outpatient Prescriptions  Medication Sig Dispense Refill  . calcium carbonate (OS-CAL) 600 MG TABS Take 600 mg by mouth 2 (two) times daily with a meal.      . meloxicam (MOBIC) 15 MG tablet Take 1 tablet (15 mg total) by mouth daily as needed for pain.      . metoprolol tartrate (LOPRESSOR) 25 MG tablet TAKE ONE TABLET BY MOUTH TWICE DAILY  60 tablet  5  . Misc Natural Products (GLUCOSAMINE CHOND COMPLEX/MSM) TABS Take 3,000 mg by mouth  daily.      . Multiple Vitamin (MULTIVITAMIN) tablet Take 1 tablet by mouth daily.      . Omega 3-6-9 Fatty Acids (OMEGA-3 & OMEGA-6 FISH OIL PO) Take 1,000 mg by mouth daily.      . vitamin E 400 UNIT capsule Take 400 Units by mouth daily.       No current facility-administered medications for this visit.    Social History History   Social History  . Marital Status: Widowed    Spouse Name: N/A    Number of Children: 3  . Years of Education: N/A   Occupational History  . Legal assistant    Social History Main Topics  . Smoking status: Never Smoker   . Smokeless tobacco: Not on file  . Alcohol Use: No  . Drug Use: No  . Sexual Activity: Not Currently   Other Topics Concern  . Not on file   Social History Narrative  . No narrative on file     Review of Systems General: No chills, fever, night sweats or weight changes Cardiovascular: No chest pain, dyspnea on exertion, edema, orthopnea, palpitations, paroxysmal nocturnal dyspnea Dermatological: No rash, lesions or masses Respiratory: No cough, dyspnea Urologic: No hematuria, dysuria Abdominal: No nausea, vomiting, diarrhea, bright red blood per rectum, melena, or hematemesis Neurologic: No visual changes, weakness, changes in mental status All other systems reviewed and are otherwise negative except as noted above.  Physical Exam Vitals: Blood pressure 137/92, pulse 68, weight 180 lb (81.647 kg).  General: Well developed, well appearing 68 y.o. female in no acute distress. HEENT: Normocephalic, atraumatic. EOMs intact. Sclera nonicteric. Oropharynx clear.  Neck: Supple. No JVD. Lungs: Respirations regular and unlabored, CTA bilaterally. No wheezes, rales or rhonchi. Heart: RRR. S1, S2 present. No murmurs, rub, S3 or S4. Abdomen: Soft, non-distended.  Extremities: No clubbing, cyanosis or edema. PT/Radials 2+ and equal bilaterally. Psych: Normal affect. Neuro: Alert and oriented X 3. Moves all extremities  spontaneously.   Diagnostics  Stress echocardiogram July 2014 Study Conclusions - Stress ECG conclusions: There were no stress arrhythmias or conduction abnormalities. The stress ECG was negative for ischemia. - Staged echo: There was no echocardiographic evidence for stress-induced ischemia. - Moderate LVH with septal thickness 14.5 mm  12-lead ECG today - NSR at 68 bpm; normal intervals; no ST-T wave abnormalities  Assessment and Plan  1. Fatigue - check CBC and TSH today  2. Symptomatic PACs/PVCs - stable  - continue metoprolol - return for follow-up in 6 months  Discussed with Dr. Clifton JamesMcAlhany in clinic today. No indication to repeat stress test at this time. Surgical clearance forms completed and faxed April 2015. Signed, Rick DuffDMISTEN, Mael Delap, PA-C 10/22/2013, 3:02 PM

## 2013-10-22 NOTE — Telephone Encounter (Signed)
Patient has ? About lab work from her PCP for surgical clearance. Please call and advise.

## 2013-10-22 NOTE — Patient Instructions (Signed)
Your physician wants you to follow-up in: 6  Months with Kimberly FredricksonLori Mccullough You will receive a reminder letter in the mail two months in advance. If you don't receive a letter, please call our office to schedule the follow-up appointment.  Your physician recommends that you continue on your current medications as directed. Please refer to the Current Medication list given to you today.

## 2013-10-22 NOTE — Telephone Encounter (Signed)
Spoke with pt. She saw Rick DuffBrooke Edmisten, [place] today and was scheduled to come back for CBC and TSH. She is going to have lab work done soon by primary care and will check if these labs are going to be done.  If not she will contact us to have done.

## 2013-10-26 ENCOUNTER — Telehealth: Payer: Self-pay | Admitting: *Deleted

## 2013-10-26 NOTE — Telephone Encounter (Signed)
Follow Up ° °Pt returned call//  °

## 2013-10-26 NOTE — Telephone Encounter (Signed)
Message copied by Kem ParkinsonBARNES, KIMALEXIS on Mon Oct 26, 2013  4:11 PM ------      Message from: Minda MeoEDMISTEN, BROOKE O      Created: Thu Oct 22, 2013  3:13 PM       Kimalexis,            I spoke with Dr. Clifton JamesMcAlhany about Ms. [place]. In terms of work-up prior to surgery, he did not recommend repeating her stress test. He did not feel it was necessary since she is not having exertional chest pain or SOB. However, he did recommend checking a CBC and TSH for evaluation of fatigue. I entered these orders. I also called her but didn't get an answer. I left a message asking her to come by to get labs.             I just wanted you to know so that if you get her call back you'll know what's going on.             Thank you, thank you!!      Nehemiah SettleBrooke ------

## 2013-10-26 NOTE — Telephone Encounter (Signed)
Left message for pt to call me back 

## 2013-10-26 NOTE — Progress Notes (Signed)
Need orders in EPIc.  Surgery on 11/11/13.  Proep on 11/03/13 at 0930am.  Thank You.

## 2013-10-27 ENCOUNTER — Other Ambulatory Visit: Payer: Self-pay | Admitting: Orthopedic Surgery

## 2013-10-27 NOTE — Telephone Encounter (Signed)
Pt aware of instructions she states she will come in tomorrow or Thursday for lab work.

## 2013-10-28 ENCOUNTER — Encounter (HOSPITAL_COMMUNITY): Payer: Self-pay | Admitting: Pharmacy Technician

## 2013-10-29 ENCOUNTER — Ambulatory Visit (INDEPENDENT_AMBULATORY_CARE_PROVIDER_SITE_OTHER): Payer: Medicare Other | Admitting: *Deleted

## 2013-10-29 DIAGNOSIS — R002 Palpitations: Secondary | ICD-10-CM

## 2013-10-29 LAB — CBC WITH DIFFERENTIAL/PLATELET
BASOS ABS: 0 10*3/uL (ref 0.0–0.1)
Basophils Relative: 0.8 % (ref 0.0–3.0)
Eosinophils Absolute: 0.1 10*3/uL (ref 0.0–0.7)
Eosinophils Relative: 1.8 % (ref 0.0–5.0)
HCT: 42.3 % (ref 36.0–46.0)
Hemoglobin: 14.2 g/dL (ref 12.0–15.0)
Lymphocytes Relative: 47.6 % — ABNORMAL HIGH (ref 12.0–46.0)
Lymphs Abs: 2.3 10*3/uL (ref 0.7–4.0)
MCHC: 33.5 g/dL (ref 30.0–36.0)
MCV: 87.7 fl (ref 78.0–100.0)
MONOS PCT: 10.4 % (ref 3.0–12.0)
Monocytes Absolute: 0.5 10*3/uL (ref 0.1–1.0)
NEUTROS PCT: 39.4 % — AB (ref 43.0–77.0)
Neutro Abs: 1.9 10*3/uL (ref 1.4–7.7)
PLATELETS: 241 10*3/uL (ref 150.0–400.0)
RBC: 4.83 Mil/uL (ref 3.87–5.11)
RDW: 13.1 % (ref 11.5–15.5)
WBC: 4.9 10*3/uL (ref 4.0–10.5)

## 2013-10-29 LAB — TSH: TSH: 1.63 u[IU]/mL (ref 0.35–4.50)

## 2013-11-02 ENCOUNTER — Other Ambulatory Visit (HOSPITAL_COMMUNITY): Payer: Self-pay | Admitting: Anesthesiology

## 2013-11-02 NOTE — Progress Notes (Addendum)
Echo stress 11-20-12 epic ekg 10-22-13 epic Cbc with dif 10-29-13 epic

## 2013-11-03 ENCOUNTER — Encounter (HOSPITAL_COMMUNITY)
Admission: RE | Admit: 2013-11-03 | Discharge: 2013-11-03 | Disposition: A | Discharge status: Home / Self Care | Payer: Medicare Other | Source: Ambulatory Visit | Attending: Orthopedic Surgery | Admitting: Orthopedic Surgery

## 2013-11-03 ENCOUNTER — Encounter (HOSPITAL_COMMUNITY): Payer: Self-pay

## 2013-11-03 ENCOUNTER — Ambulatory Visit (HOSPITAL_COMMUNITY)
Admission: RE | Admit: 2013-11-03 | Discharge: 2013-11-03 | Disposition: A | Discharge status: Home / Self Care | Payer: Medicare Other | Source: Ambulatory Visit | Attending: Orthopedic Surgery | Admitting: Orthopedic Surgery

## 2013-11-03 DIAGNOSIS — Z01812 Encounter for preprocedural laboratory examination: Secondary | ICD-10-CM | POA: Insufficient documentation

## 2013-11-03 DIAGNOSIS — Z01818 Encounter for other preprocedural examination: Secondary | ICD-10-CM | POA: Insufficient documentation

## 2013-11-03 DIAGNOSIS — M8569 Other cyst of bone, multiple sites: Secondary | ICD-10-CM | POA: Insufficient documentation

## 2013-11-03 DIAGNOSIS — M1610 Unilateral primary osteoarthritis, unspecified hip: Secondary | ICD-10-CM | POA: Insufficient documentation

## 2013-11-03 DIAGNOSIS — M169 Osteoarthritis of hip, unspecified: Secondary | ICD-10-CM | POA: Insufficient documentation

## 2013-11-03 DIAGNOSIS — M76899 Other specified enthesopathies of unspecified lower limb, excluding foot: Secondary | ICD-10-CM | POA: Insufficient documentation

## 2013-11-03 HISTORY — DX: Unspecified osteoarthritis, unspecified site: M19.90

## 2013-11-03 HISTORY — DX: Other specified health status: Z78.9

## 2013-11-03 HISTORY — DX: Tachycardia, unspecified: R00.0

## 2013-11-03 LAB — URINALYSIS, ROUTINE W REFLEX MICROSCOPIC
Bilirubin Urine: NEGATIVE
Glucose, UA: NEGATIVE mg/dL
Ketones, ur: NEGATIVE mg/dL
NITRITE: POSITIVE — AB
PROTEIN: NEGATIVE mg/dL
Specific Gravity, Urine: 1.022 (ref 1.005–1.030)
UROBILINOGEN UA: 0.2 mg/dL (ref 0.0–1.0)
pH: 5.5 (ref 5.0–8.0)

## 2013-11-03 LAB — SURGICAL PCR SCREEN
MRSA, PCR: NEGATIVE
STAPHYLOCOCCUS AUREUS: NEGATIVE

## 2013-11-03 LAB — PROTIME-INR
INR: 0.93 (ref 0.00–1.49)
PROTHROMBIN TIME: 12.5 s (ref 11.6–15.2)

## 2013-11-03 LAB — URINE MICROSCOPIC-ADD ON

## 2013-11-03 LAB — APTT: APTT: 34 s (ref 24–37)

## 2013-11-03 LAB — NO BLOOD PRODUCTS

## 2013-11-03 NOTE — Patient Instructions (Addendum)
Kimberly Mccullough  11/03/2013                           YOUR PROCEDURE IS SCHEDULED ON: 11/11/13 AT 8:30 AM               ENTER THRU [place] [facility] MAIN HOSPITAL ENTRANCE AND                            FOLLOW  SIGNS TO SHORT STAY CENTER                 ARRIVE AT SHORT STAY AT: 6:30 AM               CALL THIS NUMBER IF ANY PROBLEMS THE DAY OF SURGERY :               [phone]                                REMEMBER:   Do not eat food or drink liquids AFTER MIDNIGHT                 Take these medicines the morning of surgery with               A SIPS OF WATER :      METOPROLOL   Do not wear jewelry, make-up   Do not wear lotions, powders, or perfumes.   Do not shave legs or underarms 12 hrs. before surgery (men may shave face)  Do not bring valuables to the hospital.  Contacts, dentures or bridgework may not be worn into surgery.  Leave suitcase in the car. After surgery it may be brought to your room.  For patients admitted to the hospital more than one night, checkout time is            11:00 AM                                                       ________________________________________________________________________                                                                        [place] - PREPARING FOR SURGERY  Before surgery, you can play an important role.  Because skin is not sterile, your skin needs to be as free of germs as possible.  You can reduce the number of germs on your skin by washing with CHG (chlorahexidine gluconate) soap before surgery.  CHG is an antiseptic cleaner which kills germs and bonds with the skin to continue killing germs even after washing. Please DO NOT use if you have an allergy to CHG or antibacterial soaps.  If your skin becomes reddened/irritated stop using the CHG and inform your nurse when you arrive at Short Stay. Do not shave (including legs and underarms) for at least 48 hours prior to the first CHG shower.  You  may shave  your face. Please follow these instructions carefully:   1.  Shower with CHG Soap the night before surgery and the  morning of Surgery.   2.  If you choose to wash your hair, wash your hair first as usual with your  normal  Shampoo.   3.  After you shampoo, rinse your hair and body thoroughly to remove the  shampoo.                                         4.  Use CHG as you would any other liquid soap.  You can apply chg directly  to the skin and wash . Gently wash with scrungie or clean wascloth    5.  Apply the CHG Soap to your body ONLY FROM THE NECK DOWN.   Do not use on open                           Wound or open sores. Avoid contact with eyes, ears mouth and genitals (private parts).                        Genitals (private parts) with your normal soap.              6.  Wash thoroughly, paying special attention to the area where your surgery  will be performed.   7.  Thoroughly rinse your body with warm water from the neck down.   8.  DO NOT shower/wash with your normal soap after using and rinsing off  the CHG Soap .                9.  Pat yourself dry with a clean towel.             10.  Wear clean pajamas.             11.  Place clean sheets on your bed the night of your first shower and do not  sleep with pets.  Day of Surgery : Do not apply any lotions/deodorants the morning of surgery.  Please wear clean clothes to the hospital/surgery center.  FAILURE TO FOLLOW THESE INSTRUCTIONS MAY RESULT IN THE CANCELLATION OF YOUR SURGERY    PATIENT SIGNATURE_________________________________  ______________________________________________________________________     Kimberly Mccullough  An incentive spirometer is a tool that can help keep your lungs clear and active. This tool measures how well you are filling your lungs with each breath. Taking long deep breaths may help reverse or decrease the chance of developing breathing (pulmonary) problems (especially  infection) following:  A long period of time when you are unable to move or be active. BEFORE THE PROCEDURE   If the spirometer includes an indicator to show your best effort, your nurse or respiratory therapist will set it to a desired goal.  If possible, sit up straight or lean slightly forward. Try not to slouch.  Hold the incentive spirometer in an upright position. INSTRUCTIONS FOR USE  1. Sit on the edge of your bed if possible, or sit up as far as you can in bed or on a chair. 2. Hold the incentive spirometer in an upright position. 3. Breathe out normally. 4. Place the mouthpiece in your mouth and seal your lips tightly around it. 5. Breathe in slowly and as deeply as possible,  raising the piston or the ball toward the top of the column. 6. Hold your breath for 3-5 seconds or for as long as possible. Allow the piston or ball to fall to the bottom of the column. 7. Remove the mouthpiece from your mouth and breathe out normally. 8. Rest for a few seconds and repeat Steps 1 through 7 at least 10 times every 1-2 hours when you are awake. Take your time and take a few normal breaths between deep breaths. 9. The spirometer may include an indicator to show your best effort. Use the indicator as a goal to work toward during each repetition. 10. After each set of 10 deep breaths, practice coughing to be sure your lungs are clear. If you have an incision (the cut made at the time of surgery), support your incision when coughing by placing a pillow or rolled up towels firmly against it. Once you are able to get out of bed, walk around indoors and cough well. You may stop using the incentive spirometer when instructed by your caregiver.  RISKS AND COMPLICATIONS  Take your time so you do not get dizzy or light-headed.  If you are in pain, you may need to take or ask for pain medication before doing incentive spirometry. It is harder to take a deep breath if you are having pain. AFTER  USE  Rest and breathe slowly and easily.  It can be helpful to keep track of a log of your progress. Your caregiver can provide you with a simple table to help with this. If you are using the spirometer at home, follow these instructions: SEEK MEDICAL CARE IF:   You are having difficultly using the spirometer.  You have trouble using the spirometer as often as instructed.  Your pain medication is not giving enough relief while using the spirometer.  You develop fever of 100.5 F (38.1 C) or higher. SEEK IMMEDIATE MEDICAL CARE IF:   You cough up bloody sputum that had not been present before.  You develop fever of 102 F (38.9 C) or greater.  You develop worsening pain at or near the incision site. MAKE SURE YOU:   Understand these instructions.  Will watch your condition.  Will get help right away if you are not doing well or get worse. Document Released: 08/27/2006 Document Revised: 07/09/2011 Document Reviewed: 10/28/2006 [facility] Patient Information 2014 [place], [place].   ________________________________________________________________________

## 2013-11-03 NOTE — Progress Notes (Signed)
Pt states she spoke with Dr. Lequita HaltAluisio concerning "no blood" status Refusal sheet faxed to blood bank

## 2013-11-05 NOTE — Progress Notes (Signed)
Received fax from Dr. Lequita HaltAluisio - pt tx with Cipro for UTI

## 2013-11-10 ENCOUNTER — Other Ambulatory Visit: Payer: Self-pay | Admitting: Surgical

## 2013-11-10 NOTE — Anesthesia Preprocedure Evaluation (Addendum)
Anesthesia Evaluation  Patient identified by MRN, date of birth, ID band Patient awake    Reviewed: Allergy & Precautions, H&P , NPO status , Patient's Chart, lab work & pertinent test results  Airway Mallampati: II TM Distance: >3 FB Neck ROM: Full    Dental  (+) Teeth Intact, Dental Advisory Given   Pulmonary neg pulmonary ROS,  breath sounds clear to auscultation  Pulmonary exam normal       Cardiovascular Pt. on home beta blockers + dysrhythmias Rhythm:Regular Rate:Normal     Neuro/Psych PTSDnegative neurological ROS  negative psych ROS   GI/Hepatic negative GI ROS, Neg liver ROS,   Endo/Other  negative endocrine ROS  Renal/GU negative Renal ROS  negative genitourinary   Musculoskeletal  (+) Arthritis -,   Abdominal   Peds  Hematology negative hematology ROS (+)   Anesthesia Other Findings   Reproductive/Obstetrics                          Anesthesia Physical Anesthesia Plan  ASA: II  Anesthesia Plan: General   Post-op Pain Management:    Induction: Intravenous  Airway Management Planned: Oral ETT  Additional Equipment:   Intra-op Plan:   Post-operative Plan: Extubation in OR  Informed Consent: I have reviewed the patients History and Physical, chart, labs and discussed the procedure including the risks, benefits and alternatives for the proposed anesthesia with the patient or authorized representative who has indicated his/her understanding and acceptance.   Dental advisory given  Plan Discussed with: CRNA  Anesthesia Plan Comments:         Anesthesia Quick Evaluation

## 2013-11-10 NOTE — H&P (Signed)
TOTAL HIP ADMISSION H&P  Patient is admitted for left total hip arthroplasty.  Subjective:  Chief Complaint: left hip pain  HPI: Kimberly Mccullough, 68 y.o. female, has a history of pain and functional disability in the left hip(s) due to arthritis and patient has failed non-surgical conservative treatments for greater than 12 weeks to include NSAID's and/or analgesics, corticosteriod injections and activity modification.  Onset of symptoms was gradual starting 1 year ago with gradually worsening course since that time.The patient noted no past surgery on the left hip(s).  Patient currently rates pain in the left hip at 8 out of 10 with activity. Patient has night pain, worsening of pain with activity and weight bearing, pain that interfers with activities of daily living and pain with passive range of motion. Patient has evidence of periarticular osteophytes and joint space narrowing by imaging studies. This condition presents safety issues increasing the risk of falls.  There is no current active infection.  Patient Active Problem List   Diagnosis Date Noted  . Palpitations 03/11/2012   Past Medical History  Diagnosis Date  . Hyperlipidemia   . PTSD (post-traumatic stress disorder)   . No blood products   . Shortness of breath     with exertion occasionaly  . Arthritis   . Difficulty sleeping   . Rapid heart beat     takes metoprolol to treat / denies HBP    Past Surgical History  Procedure Laterality Date  . Abdominal hysterectomy    . Cholecystectomy    . Bunionectomy      BILATERAL     Current outpatient prescriptions: calcium carbonate (OS-CAL) 600 MG TABS, Take 600 mg by mouth 2 (two) times daily with a meal., Disp: , Rfl: ;   glucosamine-chondroitin 500-400 MG tablet, Take 1 tablet by mouth 2 (two) times daily., Disp: , Rfl: ;   meloxicam (MOBIC) 15 MG tablet, Take 15 mg by mouth daily., Disp: , Rfl: ;   metoprolol tartrate (LOPRESSOR) 25 MG tablet, Take 25 mg by mouth  2 (two) times daily., Disp: , Rfl:  Omega 3-6-9 Fatty Acids (OMEGA-3 & OMEGA-6 FISH OIL PO), Take 1,000 mg by mouth 2 (two) times daily. , Disp: , Rfl: ;   vitamin E 400 UNIT capsule, Take 400 Units by mouth daily., Disp: , Rfl:   Allergies  Allergen Reactions  . Other     Pt is Jehovas Witness and does not want any blood products    History  Substance Use Topics  . Smoking status: Never Smoker   . Smokeless tobacco: Not on file  . Alcohol Use: No    Family History  Problem Relation Age of Onset  . Dementia Mother   . Liver disease Father   . CAD Neg Hx      Review of Systems  Constitutional: Positive for malaise/fatigue. Negative for fever, chills, weight loss and diaphoresis.  HENT: Positive for tinnitus. Negative for congestion, ear discharge, ear pain, hearing loss, nosebleeds and sore throat.   Eyes: Negative.   Respiratory: Positive for shortness of breath. Negative for cough, hemoptysis, sputum production, wheezing and stridor.        SOB with exertion  Cardiovascular: Positive for palpitations. Negative for chest pain, orthopnea, claudication, leg swelling and PND.  Gastrointestinal: Negative.   Genitourinary: Negative.   Musculoskeletal: Positive for joint pain. Negative for back pain, falls, myalgias and neck pain.       Left hip pain  Skin: Negative.   Neurological: Negative.  Negative for weakness and headaches.  Endo/Heme/Allergies: Negative.   Psychiatric/Behavioral: Negative.     Objective:  Physical Exam  Constitutional: She is oriented to person, place, and time. She appears well-developed and well-nourished. No distress.  HENT:  Head: Normocephalic and atraumatic.  Right Ear: External ear normal.  Left Ear: External ear normal.  Nose: Nose normal.  Mouth/Throat: Oropharynx is clear and moist.  Eyes: Conjunctivae and EOM are normal.  Neck: Normal range of motion. Neck supple.  Cardiovascular: Normal rate, regular rhythm, normal heart sounds and  intact distal pulses.   No murmur heard. Respiratory: Effort normal and breath sounds normal. No respiratory distress. She has no wheezes.  GI: Soft. Bowel sounds are normal. She exhibits no distension. There is no tenderness.  Musculoskeletal:       Right hip: Normal.       Left hip: She exhibits decreased range of motion and decreased strength.       Right knee: Normal.       Left knee: Normal.       Right lower leg: She exhibits no tenderness and no swelling.       Left lower leg: She exhibits no tenderness and no swelling.  The right hip has a normal range of motion and no discomfort. The left hip flexion is to about 90. No internal or external rotation. There is only about 20 degrees of abduction.  Neurological: She is alert and oriented to person, place, and time. She has normal strength and normal reflexes. No sensory deficit.  Skin: No rash noted. She is not diaphoretic. No erythema.  Psychiatric: She has a normal mood and affect. Her behavior is normal.   Vitals Weight: 175 lb Height: 70 in Body Surface Area: 1.98 m Body Mass Index: 25.11 kg/m Pulse: 88 (Regular) BP: 142/82 (Sitting, Left Arm, Standard)   Imaging Review Plain radiographs demonstrate severe degenerative joint disease of the left hip(s). The bone quality appears to be adequate for age and reported activity level.  Assessment/Plan:  End stage arthritis, left hip(s)  The patient history, physical examination, clinical judgement of the provider and imaging studies are consistent with end stage degenerative joint disease of the left hip(s) and total hip arthroplasty is deemed medically necessary. The treatment options including medical management, injection therapy, arthroscopy and arthroplasty were discussed at length. The risks and benefits of total hip arthroplasty were presented and reviewed. The risks due to aseptic loosening, infection, stiffness, dislocation/subluxation,  thromboembolic  complications and other imponderables were discussed.  The patient acknowledged the explanation, agreed to proceed with the plan and consent was signed. Patient is being admitted for inpatient treatment for surgery, pain control, PT, OT, prophylactic antibiotics, VTE prophylaxis, progressive ambulation and ADL's and discharge planning.The patient is planning to be discharged home with home health services    Patient does NOT want any blood products     Dimitri PedAmber Corby Vandenberghe, PA-C

## 2013-11-11 ENCOUNTER — Inpatient Hospital Stay (HOSPITAL_COMMUNITY): Payer: Medicare Other

## 2013-11-11 ENCOUNTER — Inpatient Hospital Stay (HOSPITAL_COMMUNITY)
Admission: RE | Admit: 2013-11-11 | Discharge: 2013-11-13 | DRG: 470 | Disposition: A | Discharge status: Home Health | Payer: Medicare Other | Source: Ambulatory Visit | Attending: Orthopedic Surgery | Admitting: Orthopedic Surgery

## 2013-11-11 ENCOUNTER — Encounter (HOSPITAL_COMMUNITY): Payer: Medicare Other | Admitting: Anesthesiology

## 2013-11-11 ENCOUNTER — Encounter (HOSPITAL_COMMUNITY)
Admission: RE | Disposition: A | Discharge status: Home Health | Payer: Self-pay | Source: Ambulatory Visit | Attending: Orthopedic Surgery

## 2013-11-11 ENCOUNTER — Inpatient Hospital Stay (HOSPITAL_COMMUNITY): Payer: Medicare Other | Admitting: Anesthesiology

## 2013-11-11 ENCOUNTER — Encounter (HOSPITAL_COMMUNITY): Payer: Self-pay | Admitting: *Deleted

## 2013-11-11 DIAGNOSIS — E785 Hyperlipidemia, unspecified: Secondary | ICD-10-CM | POA: Diagnosis present

## 2013-11-11 DIAGNOSIS — D62 Acute posthemorrhagic anemia: Secondary | ICD-10-CM | POA: Diagnosis not present

## 2013-11-11 DIAGNOSIS — Z8249 Family history of ischemic heart disease and other diseases of the circulatory system: Secondary | ICD-10-CM

## 2013-11-11 DIAGNOSIS — R002 Palpitations: Secondary | ICD-10-CM | POA: Diagnosis present

## 2013-11-11 DIAGNOSIS — Z8489 Family history of other specified conditions: Secondary | ICD-10-CM | POA: Diagnosis not present

## 2013-11-11 DIAGNOSIS — Z96642 Presence of left artificial hip joint: Secondary | ICD-10-CM

## 2013-11-11 DIAGNOSIS — M169 Osteoarthritis of hip, unspecified: Secondary | ICD-10-CM | POA: Diagnosis present

## 2013-11-11 DIAGNOSIS — F4310 Post-traumatic stress disorder, unspecified: Secondary | ICD-10-CM | POA: Diagnosis present

## 2013-11-11 DIAGNOSIS — M76899 Other specified enthesopathies of unspecified lower limb, excluding foot: Secondary | ICD-10-CM | POA: Diagnosis present

## 2013-11-11 DIAGNOSIS — Z818 Family history of other mental and behavioral disorders: Secondary | ICD-10-CM

## 2013-11-11 DIAGNOSIS — Z79899 Other long term (current) drug therapy: Secondary | ICD-10-CM | POA: Diagnosis not present

## 2013-11-11 DIAGNOSIS — M25559 Pain in unspecified hip: Secondary | ICD-10-CM | POA: Diagnosis present

## 2013-11-11 DIAGNOSIS — M1612 Unilateral primary osteoarthritis, left hip: Secondary | ICD-10-CM

## 2013-11-11 DIAGNOSIS — M1610 Unilateral primary osteoarthritis, unspecified hip: Principal | ICD-10-CM | POA: Diagnosis present

## 2013-11-11 HISTORY — PX: TOTAL HIP ARTHROPLASTY: SHX124

## 2013-11-11 SURGERY — ARTHROPLASTY, HIP, TOTAL, ANTERIOR APPROACH
Anesthesia: General | Site: Hip | Laterality: Left

## 2013-11-11 MED ORDER — HYDRALAZINE HCL 20 MG/ML IJ SOLN
INTRAMUSCULAR | Status: DC | PRN
  Administered 2013-11-11: 5 mg via INTRAVENOUS

## 2013-11-11 MED ORDER — ACETAMINOPHEN 500 MG PO TABS
1000.0000 mg | ORAL_TABLET | Freq: Four times a day (QID) | ORAL | Status: AC
  Administered 2013-11-11 – 2013-11-12 (×3): 1000 mg via ORAL
  Filled 2013-11-11 (×3): qty 2

## 2013-11-11 MED ORDER — LIDOCAINE HCL (CARDIAC) 20 MG/ML IV SOLN
INTRAVENOUS | Status: AC
  Filled 2013-11-11: qty 5

## 2013-11-11 MED ORDER — LIDOCAINE HCL (CARDIAC) 20 MG/ML IV SOLN
INTRAVENOUS | Status: DC | PRN
  Administered 2013-11-11: 50 mg via INTRAVENOUS

## 2013-11-11 MED ORDER — TRANEXAMIC ACID 100 MG/ML IV SOLN
1000.0000 mg | INTRAVENOUS | Status: AC
  Administered 2013-11-11: 1000 mg via INTRAVENOUS
  Filled 2013-11-11: qty 10

## 2013-11-11 MED ORDER — DOCUSATE SODIUM 100 MG PO CAPS
100.0000 mg | ORAL_CAPSULE | Freq: Two times a day (BID) | ORAL | Status: DC
  Administered 2013-11-11 – 2013-11-13 (×4): 100 mg via ORAL

## 2013-11-11 MED ORDER — DIPHENHYDRAMINE HCL 12.5 MG/5ML PO ELIX: 12.5000 mg | ORAL_SOLUTION | ORAL | Status: DC | PRN

## 2013-11-11 MED ORDER — DEXAMETHASONE SODIUM PHOSPHATE 10 MG/ML IJ SOLN
INTRAMUSCULAR | Status: DC | PRN
  Administered 2013-11-11: 10 mg via INTRAVENOUS

## 2013-11-11 MED ORDER — LACTATED RINGERS IV SOLN: INTRAVENOUS | Status: DC

## 2013-11-11 MED ORDER — 0.9 % SODIUM CHLORIDE (POUR BTL) OPTIME
TOPICAL | Status: DC | PRN
  Administered 2013-11-11: 1000 mL

## 2013-11-11 MED ORDER — CHLORHEXIDINE GLUCONATE 4 % EX LIQD: 60.0000 mL | Freq: Once | CUTANEOUS | Status: DC

## 2013-11-11 MED ORDER — METOCLOPRAMIDE HCL 5 MG/ML IJ SOLN: 5.0000 mg | Freq: Three times a day (TID) | INTRAMUSCULAR | Status: DC | PRN

## 2013-11-11 MED ORDER — POLYETHYLENE GLYCOL 3350 17 G PO PACK: 17.0000 g | PACK | Freq: Every day | ORAL | Status: DC | PRN

## 2013-11-11 MED ORDER — TRAMADOL HCL 50 MG PO TABS
50.0000 mg | ORAL_TABLET | Freq: Four times a day (QID) | ORAL | Status: DC | PRN
Start: 2013-11-11 — End: 2013-11-13
  Administered 2013-11-12 – 2013-11-13 (×5): 50 mg via ORAL
  Filled 2013-11-11 (×4): qty 1
  Filled 2013-11-11: qty 2

## 2013-11-11 MED ORDER — ACETAMINOPHEN 10 MG/ML IV SOLN
INTRAVENOUS | Status: DC | PRN
  Administered 2013-11-11: 1000 mg via INTRAVENOUS

## 2013-11-11 MED ORDER — DEXAMETHASONE SODIUM PHOSPHATE 10 MG/ML IJ SOLN
INTRAMUSCULAR | Status: AC
  Filled 2013-11-11: qty 1

## 2013-11-11 MED ORDER — ACETAMINOPHEN 650 MG RE SUPP: 650.0000 mg | Freq: Four times a day (QID) | RECTAL | Status: DC | PRN

## 2013-11-11 MED ORDER — OXYCODONE HCL 5 MG PO TABS
5.0000 mg | ORAL_TABLET | ORAL | Status: DC | PRN
  Administered 2013-11-11: 5 mg via ORAL
  Administered 2013-11-11: 10 mg via ORAL
  Administered 2013-11-11: 5 mg via ORAL
  Administered 2013-11-12: 10 mg via ORAL
  Filled 2013-11-11: qty 1
  Filled 2013-11-11: qty 2
  Filled 2013-11-11: qty 1
  Filled 2013-11-11: qty 2

## 2013-11-11 MED ORDER — ACETAMINOPHEN 325 MG PO TABS: 650.0000 mg | ORAL_TABLET | Freq: Four times a day (QID) | ORAL | Status: DC | PRN

## 2013-11-11 MED ORDER — ACETAMINOPHEN 10 MG/ML IV SOLN
1000.0000 mg | Freq: Once | INTRAVENOUS | Status: DC
  Filled 2013-11-11: qty 100

## 2013-11-11 MED ORDER — BUPIVACAINE LIPOSOME 1.3 % IJ SUSP
20.0000 mL | Freq: Once | INTRAMUSCULAR | Status: DC
  Filled 2013-11-11: qty 20

## 2013-11-11 MED ORDER — HYDROMORPHONE HCL PF 2 MG/ML IJ SOLN
INTRAMUSCULAR | Status: AC
  Filled 2013-11-11: qty 1

## 2013-11-11 MED ORDER — GLYCOPYRROLATE 0.2 MG/ML IJ SOLN
INTRAMUSCULAR | Status: DC | PRN
  Administered 2013-11-11: .6 mg via INTRAVENOUS

## 2013-11-11 MED ORDER — SODIUM CHLORIDE 0.9 % IJ SOLN
INTRAMUSCULAR | Status: DC | PRN
  Administered 2013-11-11: 30 mL

## 2013-11-11 MED ORDER — METHOCARBAMOL 1000 MG/10ML IJ SOLN
500.0000 mg | Freq: Four times a day (QID) | INTRAVENOUS | Status: DC | PRN
  Filled 2013-11-11: qty 5

## 2013-11-11 MED ORDER — FENTANYL CITRATE 0.05 MG/ML IJ SOLN
INTRAMUSCULAR | Status: DC | PRN
  Administered 2013-11-11: 50 ug via INTRAVENOUS
  Administered 2013-11-11: 100 ug via INTRAVENOUS
  Administered 2013-11-11 (×2): 50 ug via INTRAVENOUS

## 2013-11-11 MED ORDER — DEXAMETHASONE SODIUM PHOSPHATE 10 MG/ML IJ SOLN
10.0000 mg | Freq: Once | INTRAMUSCULAR | Status: DC
Start: 2013-11-11 — End: 2013-11-11

## 2013-11-11 MED ORDER — KETOROLAC TROMETHAMINE 15 MG/ML IJ SOLN
7.5000 mg | Freq: Four times a day (QID) | INTRAMUSCULAR | Status: AC | PRN
  Administered 2013-11-11: 7.5 mg via INTRAVENOUS
  Filled 2013-11-11: qty 1

## 2013-11-11 MED ORDER — FLEET ENEMA 7-19 GM/118ML RE ENEM: 1.0000 | ENEMA | Freq: Once | RECTAL | Status: AC | PRN

## 2013-11-11 MED ORDER — PROPOFOL 10 MG/ML IV BOLUS
INTRAVENOUS | Status: DC | PRN
  Administered 2013-11-11: 150 mg via INTRAVENOUS

## 2013-11-11 MED ORDER — HYDROMORPHONE HCL PF 1 MG/ML IJ SOLN
INTRAMUSCULAR | Status: DC | PRN
  Administered 2013-11-11 (×2): 0.5 mg via INTRAVENOUS

## 2013-11-11 MED ORDER — SODIUM CHLORIDE 0.9 % IV SOLN
INTRAVENOUS | Status: DC
Start: 2013-11-11 — End: 2013-11-11
  Administered 2013-11-11: 11:00:00 via INTRAVENOUS

## 2013-11-11 MED ORDER — METOCLOPRAMIDE HCL 10 MG PO TABS: 5.0000 mg | ORAL_TABLET | Freq: Three times a day (TID) | ORAL | Status: DC | PRN

## 2013-11-11 MED ORDER — BISACODYL 10 MG RE SUPP: 10.0000 mg | Freq: Every day | RECTAL | Status: DC | PRN

## 2013-11-11 MED ORDER — CEFAZOLIN SODIUM-DEXTROSE 2-3 GM-% IV SOLR
2.0000 g | Freq: Four times a day (QID) | INTRAVENOUS | Status: AC
  Administered 2013-11-11 (×2): 2 g via INTRAVENOUS
  Filled 2013-11-11 (×2): qty 50

## 2013-11-11 MED ORDER — METOPROLOL TARTRATE 25 MG PO TABS
25.0000 mg | ORAL_TABLET | Freq: Two times a day (BID) | ORAL | Status: DC
  Administered 2013-11-11 – 2013-11-13 (×2): 25 mg via ORAL
  Filled 2013-11-11 (×6): qty 1

## 2013-11-11 MED ORDER — METHOCARBAMOL 500 MG PO TABS
500.0000 mg | ORAL_TABLET | Freq: Four times a day (QID) | ORAL | Status: DC | PRN
Start: 2013-11-11 — End: 2013-11-13
  Administered 2013-11-11 – 2013-11-12 (×3): 500 mg via ORAL
  Filled 2013-11-11 (×2): qty 1

## 2013-11-11 MED ORDER — GLYCOPYRROLATE 0.2 MG/ML IJ SOLN
INTRAMUSCULAR | Status: AC
  Filled 2013-11-11: qty 4

## 2013-11-11 MED ORDER — BUPIVACAINE LIPOSOME 1.3 % IJ SUSP
INTRAMUSCULAR | Status: DC | PRN
  Administered 2013-11-11: 20 mL

## 2013-11-11 MED ORDER — NEOSTIGMINE METHYLSULFATE 10 MG/10ML IV SOLN
INTRAVENOUS | Status: AC
  Filled 2013-11-11: qty 1

## 2013-11-11 MED ORDER — ROCURONIUM BROMIDE 100 MG/10ML IV SOLN
INTRAVENOUS | Status: AC
  Filled 2013-11-11: qty 1

## 2013-11-11 MED ORDER — SODIUM CHLORIDE 0.9 % IJ SOLN
INTRAMUSCULAR | Status: AC
  Filled 2013-11-11: qty 50

## 2013-11-11 MED ORDER — HYDROMORPHONE HCL PF 1 MG/ML IJ SOLN
0.2500 mg | INTRAMUSCULAR | Status: DC | PRN
  Administered 2013-11-11: 0.5 mg via INTRAVENOUS

## 2013-11-11 MED ORDER — MORPHINE SULFATE 2 MG/ML IJ SOLN: 1.0000 mg | INTRAMUSCULAR | Status: DC | PRN

## 2013-11-11 MED ORDER — ROCURONIUM BROMIDE 100 MG/10ML IV SOLN
INTRAVENOUS | Status: DC | PRN
  Administered 2013-11-11: 50 mg via INTRAVENOUS

## 2013-11-11 MED ORDER — HYDROMORPHONE HCL PF 1 MG/ML IJ SOLN
INTRAMUSCULAR | Status: AC
  Filled 2013-11-11: qty 1

## 2013-11-11 MED ORDER — FENTANYL CITRATE 0.05 MG/ML IJ SOLN
INTRAMUSCULAR | Status: AC
  Filled 2013-11-11: qty 5

## 2013-11-11 MED ORDER — DEXAMETHASONE SODIUM PHOSPHATE 10 MG/ML IJ SOLN
10.0000 mg | Freq: Every day | INTRAMUSCULAR | Status: AC
  Filled 2013-11-11: qty 1

## 2013-11-11 MED ORDER — ONDANSETRON HCL 4 MG/2ML IJ SOLN
INTRAMUSCULAR | Status: AC
  Filled 2013-11-11: qty 2

## 2013-11-11 MED ORDER — CEFAZOLIN SODIUM-DEXTROSE 2-3 GM-% IV SOLR
2.0000 g | INTRAVENOUS | Status: AC
  Administered 2013-11-11: 2 g via INTRAVENOUS

## 2013-11-11 MED ORDER — ONDANSETRON HCL 4 MG PO TABS: 4.0000 mg | ORAL_TABLET | Freq: Four times a day (QID) | ORAL | Status: DC | PRN

## 2013-11-11 MED ORDER — DEXAMETHASONE 4 MG PO TABS
10.0000 mg | ORAL_TABLET | Freq: Every day | ORAL | Status: AC
  Administered 2013-11-12: 10 mg via ORAL
  Filled 2013-11-11: qty 1

## 2013-11-11 MED ORDER — CEFAZOLIN SODIUM-DEXTROSE 2-3 GM-% IV SOLR
INTRAVENOUS | Status: AC
  Filled 2013-11-11: qty 50

## 2013-11-11 MED ORDER — PHENOL 1.4 % MT LIQD
1.0000 | OROMUCOSAL | Status: DC | PRN
  Filled 2013-11-11: qty 177

## 2013-11-11 MED ORDER — PROMETHAZINE HCL 25 MG/ML IJ SOLN: 6.2500 mg | INTRAMUSCULAR | Status: DC | PRN

## 2013-11-11 MED ORDER — MENTHOL 3 MG MT LOZG
1.0000 | LOZENGE | OROMUCOSAL | Status: DC | PRN
  Filled 2013-11-11: qty 9

## 2013-11-11 MED ORDER — MIDAZOLAM HCL 2 MG/2ML IJ SOLN
INTRAMUSCULAR | Status: AC
  Filled 2013-11-11: qty 2

## 2013-11-11 MED ORDER — LACTATED RINGERS IV SOLN
INTRAVENOUS | Status: DC | PRN
  Administered 2013-11-11 (×2): via INTRAVENOUS

## 2013-11-11 MED ORDER — SODIUM CHLORIDE 0.9 % IV SOLN
INTRAVENOUS | Status: DC
  Administered 2013-11-11: 75 mL/h via INTRAVENOUS
  Administered 2013-11-12: 01:00:00 via INTRAVENOUS

## 2013-11-11 MED ORDER — ONDANSETRON HCL 4 MG/2ML IJ SOLN
4.0000 mg | Freq: Four times a day (QID) | INTRAMUSCULAR | Status: DC | PRN
Start: 2013-11-11 — End: 2013-11-13

## 2013-11-11 MED ORDER — NEOSTIGMINE METHYLSULFATE 10 MG/10ML IV SOLN
INTRAVENOUS | Status: DC | PRN
  Administered 2013-11-11: 4 mg via INTRAVENOUS

## 2013-11-11 MED ORDER — RIVAROXABAN 10 MG PO TABS
10.0000 mg | ORAL_TABLET | Freq: Every day | ORAL | Status: DC
  Administered 2013-11-12 – 2013-11-13 (×2): 10 mg via ORAL
  Filled 2013-11-11 (×3): qty 1

## 2013-11-11 MED ORDER — PROPOFOL 10 MG/ML IV BOLUS
INTRAVENOUS | Status: AC
  Filled 2013-11-11: qty 20

## 2013-11-11 MED ORDER — BUPIVACAINE HCL (PF) 0.25 % IJ SOLN
INTRAMUSCULAR | Status: AC
  Filled 2013-11-11: qty 30

## 2013-11-11 MED ORDER — BUPIVACAINE HCL (PF) 0.25 % IJ SOLN
INTRAMUSCULAR | Status: DC | PRN
  Administered 2013-11-11: 20 mL

## 2013-11-11 MED ORDER — MIDAZOLAM HCL 5 MG/5ML IJ SOLN
INTRAMUSCULAR | Status: DC | PRN
  Administered 2013-11-11: 2 mg via INTRAVENOUS

## 2013-11-11 SURGICAL SUPPLY — 45 items
BAG SPEC THK2 15X12 ZIP CLS (MISCELLANEOUS)
BAG ZIPLOCK 12X15 (MISCELLANEOUS) IMPLANT
BLADE EXTENDED COATED 6.5IN (ELECTRODE) ×3 IMPLANT
BLADE SAW SGTL 18X1.27X75 (BLADE) ×2 IMPLANT
BLADE SAW SGTL 18X1.27X75MM (BLADE) ×1
CAPT HIP PF COP ×2 IMPLANT
CLOSURE WOUND 1/2 X4 (GAUZE/BANDAGES/DRESSINGS) ×1
COVER PERINEAL POST (MISCELLANEOUS) ×3 IMPLANT
DECANTER SPIKE VIAL GLASS SM (MISCELLANEOUS) ×3 IMPLANT
DRAPE C-ARM 42X120 X-RAY (DRAPES) ×3 IMPLANT
DRAPE STERI IOBAN 125X83 (DRAPES) ×3 IMPLANT
DRAPE U-SHAPE 47X51 STRL (DRAPES) ×9 IMPLANT
DRSG ADAPTIC 3X8 NADH LF (GAUZE/BANDAGES/DRESSINGS) ×3 IMPLANT
DRSG MEPILEX BORDER 4X4 (GAUZE/BANDAGES/DRESSINGS) ×3 IMPLANT
DRSG MEPILEX BORDER 4X8 (GAUZE/BANDAGES/DRESSINGS) ×3 IMPLANT
DURAPREP 26ML APPLICATOR (WOUND CARE) ×3 IMPLANT
ELECT REM PT RETURN 9FT ADLT (ELECTROSURGICAL) ×3
ELECTRODE REM PT RTRN 9FT ADLT (ELECTROSURGICAL) ×1 IMPLANT
EVACUATOR 1/8 PVC DRAIN (DRAIN) ×3 IMPLANT
FACESHIELD WRAPAROUND (MASK) ×12 IMPLANT
FACESHIELD WRAPAROUND OR TEAM (MASK) ×4 IMPLANT
GAUZE SPONGE 4X4 12PLY STRL (GAUZE/BANDAGES/DRESSINGS) IMPLANT
GLOVE BIO SURGEON STRL SZ7.5 (GLOVE) ×3 IMPLANT
GLOVE BIO SURGEON STRL SZ8 (GLOVE) ×6 IMPLANT
GLOVE BIOGEL PI IND STRL 8 (GLOVE) ×2 IMPLANT
GLOVE BIOGEL PI INDICATOR 8 (GLOVE) ×4
GOWN STRL REUS W/TWL LRG LVL3 (GOWN DISPOSABLE) ×3 IMPLANT
GOWN STRL REUS W/TWL XL LVL3 (GOWN DISPOSABLE) ×3 IMPLANT
KIT BASIN OR (CUSTOM PROCEDURE TRAY) ×3 IMPLANT
NDL SAFETY ECLIPSE 18X1.5 (NEEDLE) ×2 IMPLANT
NEEDLE HYPO 18GX1.5 SHARP (NEEDLE) ×6
PACK TOTAL JOINT (CUSTOM PROCEDURE TRAY) ×3 IMPLANT
PADDING CAST COTTON 6X4 STRL (CAST SUPPLIES) ×3 IMPLANT
STRIP CLOSURE SKIN 1/2X4 (GAUZE/BANDAGES/DRESSINGS) ×2 IMPLANT
SUCTION FRAZIER 12FR DISP (SUCTIONS) IMPLANT
SUT ETHIBOND NAB CT1 #1 30IN (SUTURE) ×3 IMPLANT
SUT MNCRL AB 4-0 PS2 18 (SUTURE) ×3 IMPLANT
SUT VIC AB 2-0 CT1 27 (SUTURE) ×6
SUT VIC AB 2-0 CT1 TAPERPNT 27 (SUTURE) ×2 IMPLANT
SUT VLOC 180 0 24IN GS25 (SUTURE) ×3 IMPLANT
SYRINGE 20CC LL (MISCELLANEOUS) ×3 IMPLANT
SYRINGE 60CC LL (MISCELLANEOUS) ×3 IMPLANT
TOWEL OR 17X26 10 PK STRL BLUE (TOWEL DISPOSABLE) ×3 IMPLANT
TRAY FOLEY CATH 14FRSI W/METER (CATHETERS) ×3 IMPLANT
WATER STERILE IRR 1500ML POUR (IV SOLUTION) ×2 IMPLANT

## 2013-11-11 NOTE — Op Note (Signed)
OPERATIVE REPORT  PREOPERATIVE DIAGNOSIS: Osteoarthritis of the Left hip.   POSTOPERATIVE DIAGNOSIS: Osteoarthritis of the Left  hip.   PROCEDURE: Left total hip arthroplasty, anterior approach.   SURGEON: Ollen GrossFrank Konstance Happel, MD   ASSISTANT: Avel Peacerew perkins, PA-C  ANESTHESIA:  General  ESTIMATED BLOOD LOSS:- 700 ml   DRAINS: Hemovac x1.   COMPLICATIONS: None   CONDITION: PACU - hemodynamically stable.   BRIEF CLINICAL NOTE: Kimberly Mccullough is a 68 y.o. female who has advanced end-  stage arthritis of his Left  hip with progressively worsening pain and  dysfunction.The patient has failed nonoperative management and presents for  total hip arthroplasty.   PROCEDURE IN DETAIL: After successful administration of spinal  anesthetic, the traction boots for the [facility] bed were placed on both  feet and the patient was placed [facility] the [facility], boots placed into the leg  holders. The Left hip was then isolated from the perineum with plastic  drapes and prepped and draped in the usual sterile fashion. ASIS and  greater trochanter were marked and a oblique incision was made, starting  at about 1 cm lateral and 2 cm distal to the ASIS and coursing towards  the anterior cortex of the femur. The skin was cut with a 10 blade  through subcutaneous tissue to the level of the fascia overlying the  tensor fascia lata muscle. The fascia was then incised in line with the  incision at the junction of the anterior third and posterior 2/3rd. The  muscle was teased off the fascia and then the interval between the TFL  and the rectus was developed. The Hohmann retractor was then placed at  the top of the femoral neck over the capsule. The vessels overlying the  capsule were cauterized and the fat on top of the capsule was removed.  A Hohmann retractor was then placed anterior underneath the rectus  femoris to give exposure to the entire anterior capsule. A T-shaped  capsulotomy was performed.  The edges were tagged and the femoral head  was identified.       Osteophytes are removed off the superior acetabulum.  The femoral neck was then cut in situ with an oscillating saw. Traction  was then applied to the left lower extremity utilizing the [facility]  traction. The femoral head was then removed. Retractors were placed  around the acetabulum and then circumferential removal of the labrum was  performed. Osteophytes were also removed. Reaming starts at 45 mm to  medialize and  Increased in 2 mm increments to 49 mm. We reamed in  approximately 40 degrees of abduction, 20 degrees anteversion. A 50 mm  pinnacle acetabular shell was then impacted in anatomic position under  fluoroscopic guidance with excellent purchase. We did not need to place  any additional dome screws. A 32 mm neutral + 4 marathon liner was then  placed into the acetabular shell.       The femoral lift was then placed along the lateral aspect of the femur  just distal to the vastus ridge. The leg was  externally rotated and capsule  was stripped off the inferior aspect of the femoral neck down to the  level of the lesser trochanter, this was done with electrocautery. The femur was lifted after this was performed. The  leg was then placed and extended in adducted position to essentially delivering the femur. We also removed the capsule superiorly and the  piriformis from the  piriformis fossa to gain excellent exposure of the  proximal femur. Rongeur was used to remove some cancellous bone to get  into the lateral portion of the proximal femur for placement of the  initial starter reamer. The starter broaches was placed  the starter broach  and was shown to go down the center of the canal. Broaching  with the  Corail system was then performed starting at size 8, coursing  Up to size 13. A size 13 had excellent torsional and rotational  and axial stability. The trial standard offset neck was then placed  with a 32 + 1  trial head. The hip was then reduced. We confirmed that  the stem was in the canal both on AP and lateral x-rays. It also has excellent sizing. The hip was reduced with outstanding stability through full extension, full external rotation,  and then flexion in adduction internal rotation. AP pelvis was taken  and the leg lengths were measured and found to be exactly equal. Hip  was then dislocated again and the femoral head and neck removed. The  femoral broach was removed. Size 13 Corail stem with a standard offset  neck was then impacted into the femur following native anteversion. Has  excellent purchase in the canal. Excellent torsional and rotational and  axial stability. It is confirmed to be in the canal on AP and lateral  fluoroscopic views. The 32 + 1 ceamic head was placed and the hip  reduced with outstanding stability. Again AP pelvis was taken and it  confirmed that the leg lengths were equal. The wound was then copiously  irrigated with saline solution and the capsule reattached and repaired  with Ethibond suture.  20 mL of Exparel mixed with 50 mL of saline then additional 20 ml of .25% Bupivicaine injected into the capsule and into the edge of the tensor fascia lata as well as subcutaneous tissue. The fascia overlying the tensor fascia lata was  then closed with a running #1 V-Loc. Subcu was closed with interrupted  2-0 Vicryl and subcuticular running 4-0 Monocryl. Incision was cleaned  and dried. Steri-Strips and a bulky sterile dressing applied. Hemovac  drain was hooked to suction and then he was awakened and transported to  recovery in stable condition.        Please note that a surgical assistant was a medical necessity for this procedure to perform it in a safe and expeditious manner. Assistant was necessary to provide appropriate retraction of vital neurovascular structures and to prevent femoral fracture and allow for anatomic placement of the prosthesis.  Ollen Gross,  M.D.

## 2013-11-11 NOTE — Interval H&P Note (Signed)
History and Physical Interval Note:  11/11/2013 8:10 AM  Kimberly Mccullough  has presented today for surgery, with the diagnosis of OSTEOARTHRITIS LEFT HIP   The various methods of treatment have been discussed with the patient and family. After consideration of risks, benefits and other options for treatment, the patient has consented to  Procedure(s): LEFT TOTAL HIP ARTHROPLASTY ANTERIOR APPROACH (Left) as a surgical intervention .  The patient's history has been reviewed, patient examined, no change in status, stable for surgery.  I have reviewed the patient's chart and labs.  Questions were answered to the patient's satisfaction.     Loanne DrillingALUISIO,[name]

## 2013-11-11 NOTE — Anesthesia Postprocedure Evaluation (Signed)
Anesthesia Post Note  Patient: Kimberly Mccullough  Procedure(s) Performed: Procedure(s) (LRB): LEFT TOTAL HIP ARTHROPLASTY ANTERIOR APPROACH (Left)  Anesthesia type: General  Patient location: PACU  Post pain: Pain level controlled  Post assessment: Post-op Vital signs reviewed  Last Vitals:  Filed Vitals:   11/11/13 1430  BP: 99/62  Pulse: 72  Temp: 37.3 C  Resp: 16    Post vital signs: Reviewed  Level of consciousness: sedated  Complications: No apparent anesthesia complications

## 2013-11-11 NOTE — Transfer of Care (Signed)
Immediate Anesthesia Transfer of Care Note  Patient: Kimberly Mccullough  Procedure(s) Performed: Procedure(s) (LRB): LEFT TOTAL HIP ARTHROPLASTY ANTERIOR APPROACH (Left)  Patient Location: PACU  Anesthesia Type: General  Level of Consciousness: sedated, patient cooperative and responds to stimulation  Airway & Oxygen Therapy: Patient Spontanous Breathing and Patient connected to face mask oxgen  Post-op Assessment: Report given to PACU RN and Post -op Vital signs reviewed and stable  Post vital signs: Reviewed and stable  Complications: No apparent anesthesia complications

## 2013-11-12 DIAGNOSIS — D62 Acute posthemorrhagic anemia: Secondary | ICD-10-CM | POA: Diagnosis not present

## 2013-11-12 LAB — BASIC METABOLIC PANEL
ANION GAP: 10 (ref 5–15)
BUN: 16 mg/dL (ref 6–23)
CHLORIDE: 106 meq/L (ref 96–112)
CO2: 22 mEq/L (ref 19–32)
Calcium: 9 mg/dL (ref 8.4–10.5)
Creatinine, Ser: 0.78 mg/dL (ref 0.50–1.10)
GFR calc Af Amer: >90 mL/min (ref >90)
GFR calc non Af Amer: 85 mL/min — ABNORMAL LOW (ref >90)
Glucose, Bld: 122 mg/dL — ABNORMAL HIGH (ref 70–99)
POTASSIUM: 4.5 meq/L (ref 3.7–5.3)
SODIUM: 138 meq/L (ref 137–147)

## 2013-11-12 LAB — CBC
HCT: 32.5 % — ABNORMAL LOW (ref 36.0–46.0)
Hemoglobin: 11.2 g/dL — ABNORMAL LOW (ref 12.0–15.0)
MCH: 29.2 pg (ref 26.0–34.0)
MCHC: 34.5 g/dL (ref 30.0–36.0)
MCV: 84.6 fL (ref 78.0–100.0)
PLATELETS: 176 10*3/uL (ref 150–400)
RBC: 3.84 MIL/uL — ABNORMAL LOW (ref 3.87–5.11)
RDW: 12.6 % (ref 11.5–15.5)
WBC: 9.4 10*3/uL (ref 4.0–10.5)

## 2013-11-12 NOTE — Evaluation (Signed)
Occupational Therapy Evaluation Patient Details Name: Kimberly Mccullough MRN: [id number] DOB: 1945-07-03 Today's Date: 11/12/2013    History of Present Illness L THR   Clinical Impression   This 68 year old female was admitted for L DA THA.  She will benefit from skilled OT to increase safety and independence with adls.  Pt had slow processing during eval. She was mod I prior to admission and now needs overall mod A for transfers and adls.  Goals are set at min A level.    Follow Up Recommendations  Home health OT;No OT follow up;Supervision/Assistance - 24 hour (depending upon progress)    Equipment Recommendations  3 in 1 bedside comode (delivered)    Recommendations for Other Services       Precautions / Restrictions Precautions Precautions: Fall Restrictions Weight Bearing Restrictions: No Other Position/Activity Restrictions: WBAT      Mobility Bed Mobility Overal bed mobility: Needs Assistance Bed Mobility: Supine to Sit     Supine to sit: Min assist;Mod assist     General bed mobility comments: cues for sequence and use of R LE to self assist  Transfers Overall transfer level: Needs assistance Equipment used: Rolling walker (2 wheeled) Transfers: Sit to/from Stand Sit to Stand: Mod assist         General transfer comment: cues for hand placement    Balance                                            ADL Overall ADL's : Needs assistance/impaired     Grooming: Set up;Sitting   Upper Body Bathing: Set up;Sitting   Lower Body Bathing: Moderate assistance;Sit to/from stand   Upper Body Dressing : Set up;Sitting   Lower Body Dressing: Moderate assistance;With adaptive equipment;Sit to/from stand (sock aide)                 General ADL Comments: Reviewed AE: pt has a sock aide, long shoehorn.  She wants to get a reacher.  Son will stay with her, but pt wants to be independent with ADLs.  Will practice more tomorrow.  pt had  worked for 1 hour to get contacts in:  she was only able to get one in.  Pt had some slow processing--spoke to RN.  Had 0xy last night--only Tramadol today     Vision                     Perception     Praxis      Pertinent Vitals/Pain Moderate, L hip; repositioned with ice     Hand Dominance Right   Extremity/Trunk Assessment Upper Extremity Assessment Upper Extremity Assessment: Overall WFL for tasks assessed   Lower Extremity Assessment Lower Extremity Assessment: LLE deficits/detail LLE Deficits / Details: 2/5 hip strength wtih AAROM at hip to 75 flex and 15 abd       Communication Communication Communication: No difficulties   Cognition Arousal/Alertness: Awake/alert Behavior During Therapy: WFL for tasks assessed/performed Overall Cognitive Status: Impaired/Different from baseline (decreased processing)                     General Comments       Exercises Exercises: Total Joint     Shoulder Instructions      Home Living Family/patient expects to be discharged to:: Private residence Living Arrangements: Children Available Help  at Discharge: Family Type of Home: House Home Access: Level entry     Home Layout: One level     Bathroom Shower/Tub: Producer, television/film/videoWalk-in shower   Bathroom Toilet: Standard     Home Equipment: Environmental consultantWalker - 2 wheels;Cane - single point   Additional Comments: 3:1 was delivered to room      Prior Functioning/Environment Level of Independence: Independent             OT Diagnosis: Generalized weakness   OT Problem List: Decreased strength;Decreased activity tolerance;Decreased cognition;Decreased knowledge of use of DME or AE;Pain   OT Treatment/Interventions: Self-care/ADL training;DME and/or AE instruction;Patient/family education    OT Goals(Current goals can be found in the care plan section) Acute Rehab OT Goals Patient Stated Goal: regain independence OT Goal Formulation: With patient Time For Goal  Achievement: 11/19/13 Potential to Achieve Goals: Good ADL Goals Pt Will Perform Grooming: with supervision;standing Pt Will Perform Lower Body Bathing: with adaptive equipment;sit to/from stand;with min assist Pt Will Perform Lower Body Dressing: with min assist;with adaptive equipment;sit to/from stand Pt Will Transfer to Toilet: with min assist;ambulating;bedside commode Pt Will Perform Toileting - Clothing Manipulation and hygiene: with min assist;sit to/from stand Pt Will Perform Tub/Shower Transfer: with min assist;ambulating;Shower transfer  OT Frequency: Min 2X/week   Barriers to D/C:            Co-evaluation              End of Session    Activity Tolerance: Patient tolerated treatment well Patient left: in chair;with call bell/phone within reach;with family/visitor present   Time: 1256-1316 OT Time Calculation (min): 20 min Charges:  OT General Charges $OT Visit: 1 Procedure OT Evaluation $Initial OT Evaluation Tier I: 1 Procedure OT Treatments $Self Care/Home Management : 8-22 mins G-Codes:    Sherril Heyward 11/12/2013, 1:37 PM  Marica OtterMaryellen Dedric Ethington, OTR/L [phone] 11/12/2013

## 2013-11-12 NOTE — Progress Notes (Signed)
Physical Therapy Treatment Patient Details Name: Kimberly Mccullough MRN: [id number] DOB: 1946-01-29 Today's Date: 11/12/2013    History of Present Illness L THR    PT Comments      Follow Up Recommendations  Home health PT     Equipment Recommendations  None recommended by PT    Recommendations for Other Services OT consult     Precautions / Restrictions Precautions Precautions: Fall Restrictions Weight Bearing Restrictions: No Other Position/Activity Restrictions: WBAT    Mobility  Bed Mobility Overal bed mobility: Needs Assistance Bed Mobility: Sit to Supine       Sit to supine: Min assist   General bed mobility comments: cues for sequence and use of R LE to self assist  Transfers Overall transfer level: Needs assistance Equipment used: Rolling walker (2 wheeled) Transfers: Sit to/from Stand Sit to Stand: Min assist         General transfer comment: cues for hand placement  Ambulation/Gait Ambulation/Gait assistance: Min assist;Min guard Ambulation Distance (Feet): 123 Feet Assistive device: Rolling walker (2 wheeled) Gait Pattern/deviations: Step-to pattern;Step-through pattern;Decreased step length - right;Decreased step length - left;Shuffle;Trunk flexed Gait velocity: decr   General Gait Details: cues for sequence, posture, stride length and position from [employer]   Stairs            Wheelchair Mobility    Modified Rankin (Stroke Patients Only)       Balance                                    Cognition Arousal/Alertness: Awake/alert Behavior During Therapy: WFL for tasks assessed/performed Overall Cognitive Status: Within Functional Limits for tasks assessed                      Exercises      General Comments        Pertinent Vitals/Pain 4/10; premed, ice pack provided    Home Living Family/patient expects to be discharged to:: Private residence Living Arrangements: Children Available Help at  Discharge: Family Type of Home: House Home Access: Level entry   Home Layout: One level Home Equipment: Environmental consultantWalker - 2 wheels;Cane - single point Additional Comments: 3:1 was delivered to room    Prior Function Level of Independence: Independent          PT Goals (current goals can now be found in the care plan section) Acute Rehab PT Goals Patient Stated Goal: regain independence PT Goal Formulation: With patient Time For Goal Achievement: 11/19/13 Potential to Achieve Goals: Good Progress towards PT goals: Progressing toward goals    Frequency  7X/week    PT Plan Current plan remains appropriate    Co-evaluation             End of Session Equipment Utilized During Treatment: Gait belt Activity Tolerance: Patient tolerated treatment well Patient left: in bed;with call bell/phone within reach;with family/visitor present     Time: [id number] PT Time Calculation (min): 27 min  Charges:  $Gait Training: 23-37 mins                    G Codes:      Phares Zaccone 11/12/2013, 4:41 PM

## 2013-11-12 NOTE — Discharge Instructions (Addendum)
°Dr. Frank Aluisio °Total Joint Specialist °[facility] °[address] °[place], [place] [address] °[phone] ° ° ° °ANTERIOR APPROACH TOTAL HIP REPLACEMENT POSTOPERATIVE DIRECTIONS ° ° °Hip Rehabilitation, Guidelines Following Surgery  °The results of a hip operation are greatly improved after range of motion and muscle strengthening exercises. Follow all safety measures which are given to protect your hip. If any of these exercises cause increased pain or swelling in your joint, decrease the amount until you are comfortable again. Then slowly increase the exercises. Call your caregiver if you have problems or questions.  °HOME CARE INSTRUCTIONS  °Most of the following instructions are designed to prevent the dislocation of your new hip.  °Remove items at home which could result in a fall. This includes throw rugs or furniture in walking pathways.  °Continue medications as instructed at time of discharge. °· You may have some home medications which will be placed on hold until you complete the course of blood thinner medication. °· You may start showering once you are discharged home but do not submerge the incision under water. Just pat the incision dry and apply a dry gauze dressing on daily. °Do not put on socks or shoes without following the instructions of your caregivers.  °Sit on high chairs which makes it easier to stand.  °Sit on chairs with arms. Use the chair arms to help push yourself up when arising.  °Keep your leg on the side of the operation out in front of you when standing up.  °Arrange for the use of a toilet seat elevator so you are not sitting low.   °· Walk with walker as instructed.  °You may resume a sexual relationship in one month or when given the OK by your caregiver.  °Use walker as long as suggested by your caregivers.  °You may put full weight on your legs and walk as much as is comfortable. °Avoid periods of inactivity such as sitting longer than an hour  when not asleep. This helps prevent blood clots.  °You may return to work once you are cleared by your surgeon.  °Do not drive a car for 6 weeks or until released by your surgeon.  °Do not drive while taking narcotics.  °Wear elastic stockings for three weeks following surgery during the day but you may remove then at night.  °Make sure you keep all of your appointments after your operation with all of your doctors and caregivers. You should call the office at the above phone number and make an appointment for approximately two weeks after the date of your surgery. °Change the dressing daily and reapply a dry dressing each time. °Please pick up a stool softener and laxative for home use as long as you are requiring pain medications. °· Continue to use ice on the hip for pain and swelling from surgery. You may notice swelling that will progress down to the foot and ankle.  This is normal after  surgery.  Elevate the leg when you are not up walking on it.   °It is important for you to complete the blood thinner medication as prescribed by your doctor. °· Continue to use the breathing machine which will help keep your temperature down.  It is common for your temperature to cycle up and down following surgery, especially at night when you are not up moving around and exerting yourself.  The breathing machine keeps your lungs expanded and your temperature down. ° °RANGE OF MOTION AND STRENGTHENING EXERCISES  °  These exercises are designed to help you keep full movement of your hip joint. Follow your caregiver's or physical therapist's instructions. Perform all exercises about fifteen times, three times per day or as directed. Exercise both hips, even if you have had only one joint replacement. These exercises can be done on a training (exercise) mat, on the floor, on a table or on a bed. Use whatever works the best and is most comfortable for you. Use music or television while you are exercising so that the exercises are  a pleasant break in your day. This will make your life better with the exercises acting as a break in routine you can look forward to.  °Lying on your back, slowly slide your foot toward your buttocks, raising your knee up off the floor. Then slowly slide your foot back down until your leg is straight again.  °Lying on your back spread your legs as far apart as you can without causing discomfort.  °Lying on your side, raise your upper leg and foot straight up from the floor as far as is comfortable. Slowly lower the leg and repeat.  °Lying on your back, tighten up the muscle in the front of your thigh (quadriceps muscles). You can do this by keeping your leg straight and trying to raise your heel off the floor. This helps strengthen the largest muscle supporting your knee.  °Lying on your back, tighten up the muscles of your buttocks both with the legs straight and with the knee bent at a comfortable angle while keeping your heel on the floor.  ° °SKILLED REHAB INSTRUCTIONS: °If the patient is transferred to a skilled rehab facility following release from the hospital, a list of the current medications will be sent to the facility for the patient to continue.  When discharged from the skilled rehab facility, please have the facility set up the patient's Home Health Physical Therapy prior to being released. Also, the skilled facility will be responsible for providing the patient with their medications at time of release from the facility to include their pain medication, the muscle relaxants, and their blood thinner medication. If the patient is still at the rehab facility at time of the two week follow up appointment, the skilled rehab facility will also need to assist the patient in arranging follow up appointment in our office and any transportation needs. ° °MAKE SURE YOU:  °Understand these instructions.  °Will watch your condition.  °Will get help right away if you are not doing well or get worse. ° °Pick up  stool softner and laxative for home. °Do not submerge incision under water. °May shower. °Continue to use ice for pain and swelling from surgery. °Total Hip Protocol. ° °Take Xarelto for two and a half more weeks, then discontinue Xarelto. °Once the patient has completed the blood thinner regimen, then take a Baby 81 mg Aspirin daily for three more weeks. ° °Information on my medicine - XARELTO® (Rivaroxaban) ° °This medication education was reviewed with me or my healthcare representative as part of my discharge preparation.  The pharmacist that spoke with me during my hospital stay was:  Absher, Randall K, RPH ° °Why was Xarelto® prescribed for you? °Xarelto® was prescribed for you to reduce the risk of blood clots forming after orthopedic surgery. The medical term for these abnormal blood clots is venous thromboembolism (VTE). ° °What do you need to know about xarelto® ? °Take your Xarelto® ONCE DAILY at the same time every day. °You may   take it either with or without food. ° °If you have difficulty swallowing the tablet whole, you may crush it and mix in applesauce just prior to taking your dose. ° °Take Xarelto® exactly as prescribed by your doctor and DO NOT stop taking Xarelto® without talking to the doctor who prescribed the medication.  Stopping without other VTE prevention medication to take the place of Xarelto® may increase your risk of developing a clot. ° °After discharge, you should have regular check-up appointments with your healthcare provider that is prescribing your Xarelto®.   ° °What do you do if you miss a dose? °If you miss a dose, take it as soon as you remember on the same day then continue your regularly scheduled once daily regimen the next day. Do not take two doses of Xarelto® on the same day.  ° °Important Safety Information °A possible side effect of Xarelto® is bleeding. You should call your healthcare provider right away if you experience any of the following: °  Bleeding from an  injury or your nose that does not stop. °  Unusual colored urine (red or dark brown) or unusual colored stools (red or black). °  Unusual bruising for unknown reasons. °  A serious fall or if you hit your head (even if there is no bleeding). ° °Some medicines may interact with Xarelto® and might increase your risk of bleeding while on Xarelto®. To help avoid this, consult your healthcare provider or pharmacist prior to using any new prescription or non-prescription medications, including herbals, vitamins, non-steroidal anti-inflammatory drugs (NSAIDs) and supplements. ° °This website has more information on Xarelto®: www.xarelto.com. ° ° °

## 2013-11-12 NOTE — Evaluation (Signed)
Physical Therapy Evaluation Patient Details Name: Kimberly Mccullough MRN: [id number] DOB: [birth year] Today's Date: 11/12/2013   History of Present Illness  L THR  Clinical Impression  Pt s/p L THR presents with decreased L LE strength/ROM and post op pain limiting functional mobility.  Pt should progress to d/c home with family assist and HHPT follow up.    Follow Up Recommendations Home health PT    Equipment Recommendations  None recommended by PT    Recommendations for Other Services OT consult     Precautions / Restrictions Precautions Precautions: Fall Restrictions Weight Bearing Restrictions: No Other Position/Activity Restrictions: WBAT      Mobility  Bed Mobility Overal bed mobility: Needs Assistance Bed Mobility: Supine to Sit     Supine to sit: Min assist;Mod assist     General bed mobility comments: cues for sequence and use of R LE to self assist  Transfers Overall transfer level: Needs assistance Equipment used: Rolling walker (2 wheeled) Transfers: Sit to/from Stand Sit to Stand: Mod assist         General transfer comment: cues for LE management and use of UEs to self assist  Ambulation/Gait Ambulation/Gait assistance: Min assist;Mod assist Ambulation Distance (Feet): 35 Feet Assistive device: Rolling walker (2 wheeled) Gait Pattern/deviations: Step-to pattern;Decreased step length - right;Decreased step length - left;Shuffle;Trunk flexed Gait velocity: decr   General Gait Details: cues for sequence, posture, stride length and position from [employer]  Stairs            Wheelchair Mobility    Modified Rankin (Stroke Patients Only)       Balance                                             Pertinent Vitals/Pain 4/10; premed, ice pack provided    Home Living Family/patient expects to be discharged to:: Private residence Living Arrangements: Children Available Help at Discharge: Family Type of Home: House Home  Access: Level entry     Home Layout: One level Home Equipment: Environmental consultantWalker - 2 wheels;Cane - single point      Prior Function Level of Independence: Independent               Hand Dominance   Dominant Hand: Right    Extremity/Trunk Assessment   Upper Extremity Assessment: Overall WFL for tasks assessed           Lower Extremity Assessment: LLE deficits/detail   LLE Deficits / Details: 2/5 hip strength wtih AAROM at hip to 75 flex and 15 abd     Communication   Communication: No difficulties  Cognition Arousal/Alertness: Awake/alert Behavior During Therapy: WFL for tasks assessed/performed Overall Cognitive Status: Within Functional Limits for tasks assessed                      General Comments      Exercises Total Joint Exercises Ankle Circles/Pumps: AROM;Both;15 reps;Supine Quad Sets: AROM;Both;10 reps;Supine Heel Slides: AAROM;Left;15 reps;Supine Hip ABduction/ADduction: AAROM;Left;10 reps;Supine      Assessment/Plan    PT Assessment Patient needs continued PT services  PT Diagnosis Difficulty walking   PT Problem List Decreased strength;Decreased range of motion;Decreased activity tolerance;Decreased mobility;Decreased knowledge of use of DME;Pain  PT Treatment Interventions DME instruction;Gait training;Stair training;Functional mobility training;Therapeutic activities;Therapeutic exercise;Patient/family education   PT Goals (Current goals can be found in the Care Plan  section) Acute Rehab PT Goals Patient Stated Goal: Resume previous lifestyle with decreased pain PT Goal Formulation: With patient Time For Goal Achievement: 11/19/13 Potential to Achieve Goals: Good    Frequency 7X/week   Barriers to discharge        Co-evaluation               End of Session Equipment Utilized During Treatment: Gait belt Activity Tolerance: Patient tolerated treatment well Patient left: in chair;with call bell/phone within reach Nurse  Communication: Mobility status         Time: 1053-1130 PT Time Calculation (min): 37 min   Charges:   PT Evaluation $Initial PT Evaluation Tier I: 1 Procedure PT Treatments $Gait Training: 8-22 mins $Therapeutic Exercise: 8-22 mins   PT G Codes:          Zamari Vea 11/12/2013, 12:53 PM

## 2013-11-12 NOTE — Progress Notes (Signed)
   Subjective: 1 Day Post-Op Procedure(s) (LRB): LEFT TOTAL HIP ARTHROPLASTY ANTERIOR APPROACH (Left) Patient reports pain as mild.   Patient seen in rounds with Dr. Lequita HaltAluisio. Patient is well, but has had some minor complaints of pain in the hip, requiring pain medications We will start therapy today.  Plan is to go Home after hospital stay.  Objective: Vital signs in last 24 hours: Temp:  [97.5 F (36.4 C)-99.2 F (37.3 C)] 97.9 F (36.6 C) (07/16 0500) Pulse Rate:  [67-82] 82 (07/16 0500) Resp:  [11-19] 18 (07/16 0620) BP: (99-136)/(55-85) 118/70 mmHg (07/16 0500) SpO2:  [98 %-100 %] 99 % (07/16 0620) Weight:  [80.74 kg (178 lb)] 80.74 kg (178 lb) (07/15 1239)  Intake/Output from previous day:  Intake/Output Summary (Last 24 hours) at 11/12/13 0838 Last data filed at 11/12/13 0748  Gross per 24 hour  Intake 4147.5 ml  Output   3525 ml  Net  622.5 ml    Intake/Output this shift: Total I/O In: 240 [P.O.:240] Out: -   Labs:  Recent Labs  11/12/13 0436  HGB 11.2*    Recent Labs  11/12/13 0436  WBC 9.4  RBC 3.84*  HCT 32.5*  PLT 176    Recent Labs  11/12/13 0436  NA 138  K 4.5  CL 106  CO2 22  BUN 16  CREATININE 0.78  GLUCOSE 122*  CALCIUM 9.0   No results found for this basename: LABPT, INR,  in the last 72 hours  EXAM General - Patient is Alert and Appropriate Extremity - Neurovascular intact Sensation intact distally Dorsiflexion/Plantar flexion intact Dressing - dressing C/D/I Motor Function - intact, moving foot and toes well on exam.  Hemovac pulled without difficulty.  Past Medical History  Diagnosis Date  . Hyperlipidemia   . PTSD (post-traumatic stress disorder)   . No blood products   . Shortness of breath     with exertion occasionaly  . Arthritis   . Difficulty sleeping   . Rapid heart beat     takes metoprolol to treat / denies HBP    Assessment/Plan: 1 Day Post-Op Procedure(s) (LRB): LEFT TOTAL HIP ARTHROPLASTY  ANTERIOR APPROACH (Left) Principal Problem:   OA (osteoarthritis) of hip Active Problems:   Palpitations   Postoperative anemia due to acute blood loss  Estimated body mass index is 25.54 kg/(m^2) as calculated from the following:   Height as of this encounter: 5\' 10"  (1.778 m).   Weight as of this encounter: 80.74 kg (178 lb). Advance diet Up with therapy Plan for discharge tomorrow Discharge home with home health  DVT Prophylaxis - Xarelto Weight Bearing As Tolerated left Leg Hemovac Pulled Begin Therapy   Avel Peacerew Perkins, PA-C Orthopaedic Surgery 11/12/2013, 8:38 AM

## 2013-11-13 LAB — CBC
HEMATOCRIT: 28.4 % — AB (ref 36.0–46.0)
HEMOGLOBIN: 9.8 g/dL — AB (ref 12.0–15.0)
MCH: 29.1 pg (ref 26.0–34.0)
MCHC: 34.5 g/dL (ref 30.0–36.0)
MCV: 84.3 fL (ref 78.0–100.0)
PLATELETS: 175 10*3/uL (ref 150–400)
RBC: 3.37 MIL/uL — AB (ref 3.87–5.11)
RDW: 12.9 % (ref 11.5–15.5)
WBC: 9.2 10*3/uL (ref 4.0–10.5)

## 2013-11-13 LAB — BASIC METABOLIC PANEL
ANION GAP: 11 (ref 5–15)
BUN: 19 mg/dL (ref 6–23)
CHLORIDE: 106 meq/L (ref 96–112)
CO2: 24 mEq/L (ref 19–32)
Calcium: 8.7 mg/dL (ref 8.4–10.5)
Creatinine, Ser: 0.68 mg/dL (ref 0.50–1.10)
GFR calc Af Amer: >90 mL/min (ref >90)
GFR calc non Af Amer: 89 mL/min — ABNORMAL LOW (ref >90)
GLUCOSE: 110 mg/dL — AB (ref 70–99)
Potassium: 3.7 mEq/L (ref 3.7–5.3)
Sodium: 141 mEq/L (ref 137–147)

## 2013-11-13 MED ORDER — OXYCODONE HCL 5 MG PO TABS: 5.0000 mg | ORAL_TABLET | ORAL | Status: DC | PRN

## 2013-11-13 MED ORDER — METHOCARBAMOL 500 MG PO TABS: 500.0000 mg | ORAL_TABLET | Freq: Four times a day (QID) | ORAL | Status: DC | PRN

## 2013-11-13 MED ORDER — RIVAROXABAN 10 MG PO TABS: 10.0000 mg | ORAL_TABLET | Freq: Every day | ORAL | Status: DC

## 2013-11-13 MED ORDER — TRAMADOL HCL 50 MG PO TABS: 50.0000 mg | ORAL_TABLET | Freq: Four times a day (QID) | ORAL | Status: DC | PRN

## 2013-11-13 NOTE — Progress Notes (Signed)
Physical Therapy Treatment Patient Details Name: Kimberly Mccullough MRN: [id number] DOB: [birth year] Today's Date: 11/13/2013    History of Present Illness L THR    PT Comments    Pt progressing well.  Reviewed car transfers with pt  Follow Up Recommendations  Home health PT     Equipment Recommendations  None recommended by PT    Recommendations for Other Services OT consult     Precautions / Restrictions Precautions Precautions: Fall Restrictions Weight Bearing Restrictions: No Other Position/Activity Restrictions: WBAT    Mobility  Bed Mobility Overal bed mobility: Needs Assistance Bed Mobility: Supine to Sit     Supine to sit: Min assist     General bed mobility comments: cues for sequence and use of belt to manouver L LE  Transfers Overall transfer level: Needs assistance Equipment used: Rolling walker (2 wheeled) Transfers: Sit to/from Stand Sit to Stand: Min guard         General transfer comment: cues for hand placement  Ambulation/Gait Ambulation/Gait assistance: Min guard Ambulation Distance (Feet): 200 Feet Assistive device: Rolling walker (2 wheeled) Gait Pattern/deviations: Step-to pattern;Step-through pattern;Shuffle;Trunk flexed Gait velocity: decr   General Gait Details: cues for sequence, posture, stride length and position from [employer]   Stairs            Wheelchair Mobility    Modified Rankin (Stroke Patients Only)       Balance                                    Cognition Arousal/Alertness: Awake/alert Behavior During Therapy: WFL for tasks assessed/performed Overall Cognitive Status: Within Functional Limits for tasks assessed                      Exercises Total Joint Exercises Ankle Circles/Pumps: AROM;Both;15 reps;Supine Quad Sets: AROM;Both;10 reps;Supine Gluteal Sets: AROM;Both;10 reps;Supine Heel Slides: AAROM;Left;Supine;20 reps Hip ABduction/ADduction: AAROM;Left;Supine;15 reps    General Comments        Pertinent Vitals/Pain 4/10; premed, ice pack provided    Home Living                      Prior Function            PT Goals (current goals can now be found in the care plan section) Acute Rehab PT Goals Patient Stated Goal: regain independence PT Goal Formulation: With patient Time For Goal Achievement: 11/19/13 Potential to Achieve Goals: Good Progress towards PT goals: Progressing toward goals    Frequency  7X/week    PT Plan Current plan remains appropriate    Co-evaluation             End of Session Equipment Utilized During Treatment: Gait belt Activity Tolerance: Patient tolerated treatment well Patient left: in chair;with call bell/phone within reach     Time: [id number] PT Time Calculation (min): 40 min  Charges:  $Gait Training: 8-22 mins $Therapeutic Exercise: 8-22 mins $Therapeutic Activity: 8-22 mins                    G Codes:      Kimberly Mccullough 11/13/2013, 10:50 AM

## 2013-11-13 NOTE — Progress Notes (Signed)
[facility]   [facility] the [facility] and [facility]  If patient discharges after hours, please call [phone].   Renard HamperLecretia Williamson 11/13/2013, 10:08 AM

## 2013-11-13 NOTE — Progress Notes (Signed)
Occupational Therapy Treatment Patient Details Name: Kimberly Mccullough MRN: [id number] DOB: 1945/09/06 Today's Date: 11/13/2013    History of present illness L THR   OT comments  Pt making good progress.  Min guard for safety.  Pt able to use all AE with occasional cue to make task easier.    Follow Up Recommendations  No OT follow up;Supervision/Assistance - 24 hour    Equipment Recommendations  3 in 1 bedside comode    Recommendations for Other Services      Precautions / Restrictions Precautions Precautions: Fall Restrictions Weight Bearing Restrictions: No Other Position/Activity Restrictions: WBAT       Mobility Bed Mobility Overal bed mobility: Needs Assistance Bed Mobility: Supine to Sit     Supine to sit: Min assist Sit to supine: Supervision (with leg lifter)   General bed mobility comments: cues for sequence  Transfers Overall transfer level: Needs assistance Equipment used: Rolling walker (2 wheeled) Transfers: Sit to/from Stand Sit to Stand: Min guard         General transfer comment: cues for hand placement and LLE (for comfort)    Balance                                   ADL Overall ADL's : Needs assistance/impaired     Grooming: Wash/dry hands;Standing   Upper Body Bathing: Set up;Sitting   Lower Body Bathing: Min guard;Sit to/from stand;With adaptive equipment   Upper Body Dressing : Set up;Sitting   Lower Body Dressing: Min guard;Sit to/from stand;With adaptive equipment   Toilet Transfer: Min guard;Ambulation;BSC   Toileting- Water quality scientist and Hygiene: Min guard;Sit to/from stand   Tub/ Shower Transfer: Walk-in shower;Min guard;Ambulation (simulated shower ledge; accessible shower in room)     General ADL Comments: performed ADL from sit to stand from recliner and 3:1 commode; practiced shower transfer and also practiced with leg lifter to get back to bed.  Pt fatiqued after OT.  She was processing  better but still needed some clarification, especially with sliding L foot forward.  A family member came into the room and will stay with her and son for first few days.        Vision                     Perception     Praxis      Cognition   Behavior During Therapy: [facility] for tasks assessed/performed Overall Cognitive Status: Within Functional Limits for tasks assessed                       Extremity/Trunk Assessment               Exercises    Shoulder Instructions       General Comments      Pertinent Vitals/ Pain       C/o stiffiness, not pain in LLE.  repositioned  Home Living                                          Prior Functioning/Environment              Frequency Min 2X/week     Progress Toward Goals  OT Goals(current goals can now be found in the care plan section)  Progress towards  OT goals: Goals met/education completed, patient discharged from OT (all goals met except for grooming--min guard (not S))  Acute Rehab OT Goals Patient Stated Goal: regain independence  Plan      Co-evaluation                 End of Session     Activity Tolerance Patient tolerated treatment well   Patient Left in bed;with call bell/phone within reach;with family/visitor present   Nurse Communication          Time: 2909-0301 OT Time Calculation (min): 39 min  Charges: OT General Charges $OT Visit: 1 Procedure OT Treatments $Self Care/Home Management : 38-52 mins  Anely Spiewak 11/13/2013, 11:36 AM Lesle Chris, OTR/L [phone] 11/13/2013

## 2013-11-13 NOTE — Discharge Summary (Signed)
Physician Discharge Summary   Patient ID: Kimberly Mccullough MRN: [id number] DOB/AGE: 05/10/1945 68 y.o.  Admit date: 11/11/2013 Discharge date: 11/13/2013  Primary Diagnosis:  Osteoarthritis of the Left hip.   Admission Diagnoses:  Past Medical History  Diagnosis Date  . Hyperlipidemia   . PTSD (post-traumatic stress disorder)   . No blood products   . Shortness of breath     with exertion occasionaly  . Arthritis   . Difficulty sleeping   . Rapid heart beat     takes metoprolol to treat / denies HBP   Discharge Diagnoses:   Principal Problem:   OA (osteoarthritis) of hip Active Problems:   Palpitations   Postoperative anemia due to acute blood loss  Estimated body mass index is 25.54 kg/(m^2) as calculated from the following:   Height as of this encounter: _0  (1.778 m).   Weight as of this encounter: 80.74 kg (178 lb).  Procedure(s) (LRB): LEFT TOTAL HIP ARTHROPLASTY ANTERIOR APPROACH (Left)   Consults: None  HPI: Kimberly Mccullough is a 68 y.o. female who has advanced end-  stage arthritis of his Left hip with progressively worsening pain and  dysfunction.The patient has failed nonoperative management and presents for  total hip arthroplasty.   Laboratory Data: Admission on 11/11/2013, Discharged on 11/13/2013  Component Date Value Ref Range Status  . WBC 11/12/2013 9.4  4.0 - 10.5 K/uL Final  . RBC 11/12/2013 3.84* 3.87 - 5.11 MIL/uL Final  . Hemoglobin 11/12/2013 11.2* 12.0 - 15.0 g/dL Final  . HCT 11/12/2013 32.5* 36.0 - 46.0 % Final  . MCV 11/12/2013 84.6  78.0 - 100.0 fL Final  . MCH 11/12/2013 29.2  26.0 - 34.0 pg Final  . MCHC 11/12/2013 34.5  30.0 - 36.0 g/dL Final  . RDW 11/12/2013 12.6  11.5 - 15.5 % Final  . Platelets 11/12/2013 176  150 - 400 K/uL Final  . Sodium 11/12/2013 138  137 - 147 mEq/L Final  . Potassium 11/12/2013 4.5  3.7 - 5.3 mEq/L Final  . Chloride 11/12/2013 106  96 - 112 mEq/L Final  . CO2 11/12/2013 22  19 - 32 mEq/L Final   . Glucose, Bld 11/12/2013 122* 70 - 99 mg/dL Final  . BUN 11/12/2013 16  6 - 23 mg/dL Final  . Creatinine, Ser 11/12/2013 0.78  0.50 - 1.10 mg/dL Final  . Calcium 11/12/2013 9.0  8.4 - 10.5 mg/dL Final  . GFR calc non Af Amer 11/12/2013 85* >90 mL/min Final  . GFR calc Af Amer 11/12/2013 >90  >90 mL/min Final   Comment: (NOTE)                          The eGFR has been calculated using the CKD EPI equation.                          This calculation has not been validated in all clinical situations.                          eGFR's persistently <90 mL/min signify possible Chronic Kidney                          Disease.  . Anion gap 11/12/2013 10  5 - 15 Final  . WBC 11/13/2013 9.2  4.0 - 10.5 K/uL Final  . RBC 11/13/2013  3.37* 3.87 - 5.11 MIL/uL Final  . Hemoglobin 11/13/2013 9.8* 12.0 - 15.0 g/dL Final  . HCT 11/13/2013 28.4* 36.0 - 46.0 % Final  . MCV 11/13/2013 84.3  78.0 - 100.0 fL Final  . MCH 11/13/2013 29.1  26.0 - 34.0 pg Final  . MCHC 11/13/2013 34.5  30.0 - 36.0 g/dL Final  . RDW 11/13/2013 12.9  11.5 - 15.5 % Final  . Platelets 11/13/2013 175  150 - 400 K/uL Final  . Sodium 11/13/2013 141  137 - 147 mEq/L Final  . Potassium 11/13/2013 3.7  3.7 - 5.3 mEq/L Final   Comment: RESULT REPEATED AND VERIFIED                          DELTA CHECK NOTED  . Chloride 11/13/2013 106  96 - 112 mEq/L Final  . CO2 11/13/2013 24  19 - 32 mEq/L Final  . Glucose, Bld 11/13/2013 110* 70 - 99 mg/dL Final  . BUN 11/13/2013 19  6 - 23 mg/dL Final  . Creatinine, Ser 11/13/2013 0.68  0.50 - 1.10 mg/dL Final  . Calcium 11/13/2013 8.7  8.4 - 10.5 mg/dL Final  . GFR calc non Af Amer 11/13/2013 89* >90 mL/min Final  . GFR calc Af Amer 11/13/2013 >90  >90 mL/min Final   Comment: (NOTE)                          The eGFR has been calculated using the CKD EPI equation.                          This calculation has not been validated in all clinical situations.                          eGFR's  persistently <90 mL/min signify possible Chronic Kidney                          Disease.  [name] gap 11/13/2013 11  5 - 15 Final  Hospital Outpatient Visit on 11/03/2013  Component Date Value Ref Range Status  . MRSA, PCR 11/03/2013 NEGATIVE  NEGATIVE Final  . Staphylococcus aureus 11/03/2013 NEGATIVE  NEGATIVE Final   Comment:                                 The Xpert SA Assay (FDA                          approved for NASAL specimens                          in patients over 39 years of age),                          is one component of                          a comprehensive surveillance                          program.  Test performance has  been validated by [facility] for patients greater                          than or equal to 66 year old.                          It is not intended                          to diagnose infection nor to                          guide or monitor treatment.  Marland Kitchen aPTT 11/03/2013 34  24 - 37 seconds Final  . Prothrombin Time 11/03/2013 12.5  11.6 - 15.2 seconds Final  . INR 11/03/2013 0.93  0.00 - 1.49 Final  . Color, Urine 11/03/2013 YELLOW  YELLOW Final  . APPearance 11/03/2013 CLOUDY* CLEAR Final  . Specific Gravity, Urine 11/03/2013 1.022  1.005 - 1.030 Final  . pH 11/03/2013 5.5  5.0 - 8.0 Final  . Glucose, UA 11/03/2013 NEGATIVE  NEGATIVE mg/dL Final  . Hgb urine dipstick 11/03/2013 SMALL* NEGATIVE Final  . Bilirubin Urine 11/03/2013 NEGATIVE  NEGATIVE Final  . Ketones, ur 11/03/2013 NEGATIVE  NEGATIVE mg/dL Final  . Protein, ur 11/03/2013 NEGATIVE  NEGATIVE mg/dL Final  . Urobilinogen, UA 11/03/2013 0.2  0.0 - 1.0 mg/dL Final  . Nitrite 11/03/2013 POSITIVE* NEGATIVE Final  . Leukocytes, UA 11/03/2013 MODERATE* NEGATIVE Final  . Squamous Epithelial / LPF 11/03/2013 RARE  RARE Final  . WBC, UA 11/03/2013 7-10  <3 WBC/hpf Final  . RBC / HPF 11/03/2013 0-2  <3 RBC/hpf Final  . Bacteria,  UA 11/03/2013 MANY* RARE Final  . Transfuse no blood products 11/03/2013 TRANSFUSE NO BLOOD PRODUCTS, VERIFIED BY DARLENE REED RN Berlinda Last RN   Final  Clinical Support on 10/29/2013  Component Date Value Ref Range Status  . TSH 10/29/2013 1.63  0.35 - 4.50 uIU/mL Final  . WBC 10/29/2013 4.9  4.0 - 10.5 K/uL Final  . RBC 10/29/2013 4.83  3.87 - 5.11 Mil/uL Final  . Hemoglobin 10/29/2013 14.2  12.0 - 15.0 g/dL Final  . HCT 10/29/2013 42.3  36.0 - 46.0 % Final  . MCV 10/29/2013 87.7  78.0 - 100.0 fl Final  . MCHC 10/29/2013 33.5  30.0 - 36.0 g/dL Final  . RDW 10/29/2013 13.1  11.5 - 15.5 % Final  . Platelets 10/29/2013 241.0  150.0 - 400.0 K/uL Final  . Neutrophils Relative % 10/29/2013 39.4* 43.0 - 77.0 % Final  . Lymphocytes Relative 10/29/2013 47.6* 12.0 - 46.0 % Final  . Monocytes Relative 10/29/2013 10.4  3.0 - 12.0 % Final  . Eosinophils Relative 10/29/2013 1.8  0.0 - 5.0 % Final  . Basophils Relative 10/29/2013 0.8  0.0 - 3.0 % Final  . Neutro Abs 10/29/2013 1.9  1.4 - 7.7 K/uL Final  . Lymphs Abs 10/29/2013 2.3  0.7 - 4.0 K/uL Final  . Monocytes Absolute 10/29/2013 0.5  0.1 - 1.0 K/uL Final  . Eosinophils Absolute 10/29/2013 0.1  0.0 - 0.7 K/uL Final  . Basophils Absolute 10/29/2013 0.0  0.0 - 0.1 K/uL Final     X-Rays:Dg Chest 2 View  11/11/2013   CLINICAL DATA:  Preop for hip surgery; history of tachycardia  EXAM: CHEST  2 VIEW  COMPARISON:  None.  FINDINGS: The lungs are mildly hyperinflated without hemidiaphragm flattening. There is no focal infiltrate or other acute parenchymal abnormality. The heart and pulmonary vascularity are normal. There is mild tortuosity of the descending thoracic aorta. The bony thorax is unremarkable.  IMPRESSION: Mild hyperinflation may be voluntary or could reflect underlying reactive airway disease. There is no CHF nor pneumonia.   Electronically Signed   By: David  Martinique   On: 11/11/2013 07:44   Dg Hip Complete Left  11/03/2013   CLINICAL DATA:   Preop for left hip surgery  EXAM: LEFT HIP - COMPLETE 2+ VIEW  COMPARISON:  None.  FINDINGS: There is degenerative disc disease of both hips, much more severe on the left. On the left there is complete loss of joint space with sclerosis, spurring, and subchondral cyst formation. Lesser degenerative joint disease is noted involving the right hip. The pelvic rami are intact. The SI joints are corticated.  IMPRESSION: Bilateral degenerative joint disease of the hips, much more severe on the left.   Electronically Signed   By: Ivar Drape M.D.   On: 11/03/2013 11:36   Dg Pelvis Portable  11/11/2013   CLINICAL DATA:  Postop left total hip replacement.  EXAM: PORTABLE PELVIS 1-2 VIEWS  COMPARISON:  Left hip radiographs 11/03/2013  FINDINGS: Sequelae of interval left total hip arthroplasty are identified. The prosthetic acetabular and femoral components are approximated on this single AP projection. An adjacent surgical drain and postoperative soft tissue emphysema are noted. Foley catheter is noted. Calcifications in the pelvis are compatible with phleboliths. Mild-to-moderate right hip osteoarthrosis is again seen.  IMPRESSION: Interval left total hip arthroplasty as above.   Electronically Signed   By: Logan Bores   On: 11/11/2013 10:46   Dg C-arm 1-60 Min-no Report  11/11/2013   CLINICAL DATA: left anterior hip   C-ARM 1-60 MINUTES  Fluoroscopy was utilized by the requesting physician.  No radiographic  interpretation.     EKG: Orders placed in visit on 10/22/13  . EKG 12-LEAD     Hospital Course: Patient was admitted to [facility] and taken to the OR and underwent the above state procedure without complications.  Patient tolerated the procedure well and was later transferred to the recovery room and then to the orthopaedic floor for postoperative care.  They were given PO and IV analgesics for pain control following their surgery.  They were given 24 hours of postoperative antibiotics of    Anti-infectives   Start     Dose/Rate Route Frequency Ordered Stop   11/11/13 1500  ceFAZolin (ANCEF) IVPB 2 g/50 mL premix     2 g 100 mL/hr over 30 Minutes Intravenous Every 6 hours 11/11/13 1255 11/11/13 2151   11/11/13 0731  ceFAZolin (ANCEF) IVPB 2 g/50 mL premix     2 g 100 mL/hr over 30 Minutes Intravenous On call to O.R. 11/11/13 7106 11/11/13 0828     and started on DVT prophylaxis in the form of Xarelto.   PT and OT were ordered for total hip protocol.  The patient was allowed to be WBAT with therapy. Discharge planning was consulted to help with postop disposition and equipment needs.  Patient had a decent night on the evening of surgery.  They started to get up OOB with therapy on day one.  Hemovac drain was pulled without difficulty.  Continued to work with therapy into day two.  Dressing was changed on day two and the incision was healing well.  Patient was seen in rounds and was ready to go home on POD 2.  Discharge home with home health  Diet - Cardiac diet  Follow up - in 2 weeks  Activity - WBAT  Disposition - Home  Condition Upon Discharge - Good  D/C Meds - See DC Summary  DVT Prophylaxis - Xarelto        Discharge Instructions   Call MD / Call 911    Complete by:  As directed   If you experience chest pain or shortness of breath, CALL 911 and be transported to the hospital emergency room.  If you develope a fever above 101 F, pus (white drainage) or increased drainage or redness at the wound, or calf pain, call your surgeon's office.     Change dressing    Complete by:  As directed   You may change your dressing dressing daily with sterile 4 x 4 inch gauze dressing and paper tape.  Do not submerge the incision under water.     Constipation Prevention    Complete by:  As directed   Drink plenty of fluids.  Prune juice may be helpful.  You may use a stool softener, such as Colace (over the counter) 100 mg twice a day.  Use MiraLax (over the counter) for  constipation as needed.     Diet - low sodium heart healthy    Complete by:  As directed      Discharge instructions    Complete by:  As directed   Pick up stool softner and laxative for home. Do not submerge incision under water. May shower. Continue to use ice for pain and swelling from surgery.  Total Hip Protocol.  Take Xarelto for two and a half more weeks, then discontinue Xarelto. Once the patient has completed the blood thinner regimen, then take a Baby 81 mg Aspirin daily for three more weeks.     Do not sit on low chairs, stoools or toilet seats, as it may be difficult to get up from low surfaces    Complete by:  As directed      Driving restrictions    Complete by:  As directed   No driving until released by the physician.     Increase activity slowly as tolerated    Complete by:  As directed      Lifting restrictions    Complete by:  As directed   No lifting until released by the physician.     Patient may shower    Complete by:  As directed   You may shower without a dressing once there is no drainage.  Do not wash over the wound.  If drainage remains, do not shower until drainage stops.     TED hose    Complete by:  As directed   Use stockings (TED hose) for 3 weeks on both leg(s).  You may remove them at night for sleeping.     Weight bearing as tolerated    Complete by:  As directed             Medication List    STOP taking these medications       calcium carbonate 600 MG Tabs tablet  Commonly known as:  OS-CAL     glucosamine-chondroitin 500-400 MG tablet     meloxicam 15 MG tablet  Commonly known  as:  MOBIC     OMEGA-3 & OMEGA-6 FISH OIL PO     vitamin E 400 UNIT capsule      TAKE these medications       methocarbamol 500 MG tablet  Commonly known as:  ROBAXIN  Take 1 tablet (500 mg total) by mouth every 6 (six) hours as needed for muscle spasms.     metoprolol tartrate 25 MG tablet  Commonly known as:  LOPRESSOR  Take 25 mg by mouth 2  (two) times daily.     oxyCODONE 5 MG immediate release tablet  Commonly known as:  Oxy IR/ROXICODONE  Take 1-2 tablets (5-10 mg total) by mouth every 3 (three) hours as needed for moderate pain, severe pain or breakthrough pain.     rivaroxaban 10 MG Tabs tablet  Commonly known as:  XARELTO  - Take 1 tablet (10 mg total) by mouth daily with breakfast. Take Xarelto for two and a half more weeks, then discontinue Xarelto.  - Once the patient has completed the blood thinner regimen, then take a Baby 81 mg Aspirin daily for three more weeks.     traMADol 50 MG tablet  Commonly known as:  ULTRAM  Take 1-2 tablets (50-100 mg total) by mouth every 6 (six) hours as needed (mild pain).       Follow-up Information   Follow up with Gearlean Alf, MD. Schedule an appointment as soon as possible for a visit in 2 weeks.   Specialty:  Orthopedic Surgery   Contact information:   [address] [place] [address] [phone]       Signed: Arlee Muslim, PA-C Orthopaedic Surgery 11/17/2013, 10:26 AM

## 2013-11-13 NOTE — Progress Notes (Signed)
   Subjective: 2 Days Post-Op Procedure(s) (LRB): LEFT TOTAL HIP ARTHROPLASTY ANTERIOR APPROACH (Left) Patient reports pain as mild.   Patient seen in rounds with Dr. Lequita HaltAluisio. Patient is well, and has had no acute complaints or problems Patient is ready to go home  Objective: Vital signs in last 24 hours: Temp:  [98.1 F (36.7 C)-99 F (37.2 C)] 99 F (37.2 C) (07/17 0605) Pulse Rate:  [79-95] 89 (07/17 0605) Resp:  [18-19] 18 (07/17 0605) BP: (94-118)/(54-83) 118/83 mmHg (07/17 0605) SpO2:  [95 %-96 %] 96 % (07/17 0605)  Intake/Output from previous day:  Intake/Output Summary (Last 24 hours) at 11/13/13 0829 Last data filed at 11/12/13 1851  Gross per 24 hour  Intake    480 ml  Output    400 ml  Net     80 ml    Intake/Output this shift:    Labs:  Recent Labs  11/12/13 0436 11/13/13 0457  HGB 11.2* 9.8*    Recent Labs  11/12/13 0436 11/13/13 0457  WBC 9.4 9.2  RBC 3.84* 3.37*  HCT 32.5* 28.4*  PLT 176 175    Recent Labs  11/12/13 0436 11/13/13 0457  NA 138 141  K 4.5 3.7  CL 106 106  CO2 22 24  BUN 16 19  CREATININE 0.78 0.68  GLUCOSE 122* 110*  CALCIUM 9.0 8.7   No results found for this basename: LABPT, INR,  in the last 72 hours  EXAM: General - Patient is Alert, Appropriate and Oriented Extremity - Neurovascular intact Sensation intact distally Dorsiflexion/Plantar flexion intact Incision - clean, dry, no drainage Motor Function - intact, moving foot and toes well on exam.   Assessment/Plan: 2 Days Post-Op Procedure(s) (LRB): LEFT TOTAL HIP ARTHROPLASTY ANTERIOR APPROACH (Left) Procedure(s) (LRB): LEFT TOTAL HIP ARTHROPLASTY ANTERIOR APPROACH (Left) Past Medical History  Diagnosis Date  . Hyperlipidemia   . PTSD (post-traumatic stress disorder)   . No blood products   . Shortness of breath     with exertion occasionaly  . Arthritis   . Difficulty sleeping   . Rapid heart beat     takes metoprolol to treat / denies HBP    Principal Problem:   OA (osteoarthritis) of hip Active Problems:   Palpitations   Postoperative anemia due to acute blood loss  Estimated body mass index is 25.54 kg/(m^2) as calculated from the following:   Height as of this encounter: 5\' 10"  (1.778 m).   Weight as of this encounter: 80.74 kg (178 lb). Up with therapy Discharge home with home health Diet - Cardiac diet Follow up - in 2 weeks Activity - WBAT Disposition - Home Condition Upon Discharge - Good D/C Meds - See DC Summary DVT Prophylaxis - Xarelto  Avel Peacerew Victorian Gunn, PA-C Orthopaedic Surgery 11/13/2013, 8:29 AM

## 2013-11-13 NOTE — Progress Notes (Signed)
Physical Therapy Treatment Patient Details Name: Kimberly Mccullough MRN: [id number] DOB: 1946/03/14 Today's Date: 11/13/2013    History of Present Illness L THR    PT Comments    Progressing well.  Reviewed car transfers and use of sheet to self assist with bed mobility.    Follow Up Recommendations  Home health PT     Equipment Recommendations  None recommended by PT    Recommendations for Other Services OT consult     Precautions / Restrictions Precautions Precautions: Fall Restrictions Other Position/Activity Restrictions: WBAT    Mobility  Bed Mobility Overal bed mobility: Needs Assistance Bed Mobility: Supine to Sit     Supine to sit: Min guard     General bed mobility comments: cues for sequence and use of sheet to self assist  Transfers Overall transfer level: Needs assistance Equipment used: Rolling walker (2 wheeled) Transfers: Sit to/from Stand Sit to Stand: Supervision         General transfer comment: Cues for transition position and use of UEs to self assist  Ambulation/Gait Ambulation/Gait assistance: Min guard;Supervision Ambulation Distance (Feet): 250 Feet Assistive device: Rolling walker (2 wheeled) Gait Pattern/deviations: Step-to pattern;Step-through pattern;Decreased step length - right;Decreased step length - left;Shuffle;Trunk flexed Gait velocity: decr   General Gait Details: cues for sequence, posture, stride length and position from [employer]   Stairs            Wheelchair Mobility    Modified Rankin (Stroke Patients Only)       Balance                                    Cognition Arousal/Alertness: Awake/alert Behavior During Therapy: WFL for tasks assessed/performed Overall Cognitive Status: Within Functional Limits for tasks assessed                      Exercises      General Comments        Pertinent Vitals/Pain 3/10; premed    Home Living                      Prior  Function            PT Goals (current goals can now be found in the care plan section) Acute Rehab PT Goals Patient Stated Goal: regain independence PT Goal Formulation: With patient Time For Goal Achievement: 11/19/13 Potential to Achieve Goals: Good Progress towards PT goals: Progressing toward goals    Frequency  7X/week    PT Plan Current plan remains appropriate    Co-evaluation             End of Session Equipment Utilized During Treatment: Gait belt Activity Tolerance: Patient tolerated treatment well Patient left: Other (comment) (sitting EOB)     Time: [id number] PT Time Calculation (min): 24 min  Charges:  $Gait Training: 8-22 mins $Therapeutic Activity: 8-22 mins                    G Codes:      Kimberly Mccullough 11/13/2013, 5:12 PM

## 2013-12-31 ENCOUNTER — Ambulatory Visit: Payer: Medicare Other | Attending: Orthopedic Surgery | Admitting: Physical Therapy

## 2013-12-31 DIAGNOSIS — R609 Edema, unspecified: Secondary | ICD-10-CM | POA: Diagnosis not present

## 2013-12-31 DIAGNOSIS — Z96649 Presence of unspecified artificial hip joint: Secondary | ICD-10-CM | POA: Insufficient documentation

## 2013-12-31 DIAGNOSIS — M25569 Pain in unspecified knee: Secondary | ICD-10-CM | POA: Insufficient documentation

## 2013-12-31 DIAGNOSIS — M1710 Unilateral primary osteoarthritis, unspecified knee: Secondary | ICD-10-CM | POA: Diagnosis not present

## 2013-12-31 DIAGNOSIS — M25559 Pain in unspecified hip: Secondary | ICD-10-CM | POA: Insufficient documentation

## 2013-12-31 DIAGNOSIS — IMO0001 Reserved for inherently not codable concepts without codable children: Secondary | ICD-10-CM | POA: Diagnosis present

## 2013-12-31 DIAGNOSIS — R262 Difficulty in walking, not elsewhere classified: Secondary | ICD-10-CM | POA: Insufficient documentation

## 2013-12-31 DIAGNOSIS — IMO0002 Reserved for concepts with insufficient information to code with codable children: Secondary | ICD-10-CM

## 2014-01-05 ENCOUNTER — Ambulatory Visit: Payer: Medicare Other | Admitting: Physical Therapy

## 2014-01-05 DIAGNOSIS — IMO0001 Reserved for inherently not codable concepts without codable children: Secondary | ICD-10-CM | POA: Diagnosis not present

## 2014-01-07 ENCOUNTER — Ambulatory Visit: Payer: Medicare Other | Admitting: Physical Therapy

## 2014-01-07 DIAGNOSIS — IMO0001 Reserved for inherently not codable concepts without codable children: Secondary | ICD-10-CM | POA: Diagnosis not present

## 2014-01-12 ENCOUNTER — Ambulatory Visit: Payer: Medicare Other | Admitting: Physical Therapy

## 2014-01-12 DIAGNOSIS — IMO0001 Reserved for inherently not codable concepts without codable children: Secondary | ICD-10-CM | POA: Diagnosis not present

## 2014-01-13 ENCOUNTER — Other Ambulatory Visit (HOSPITAL_COMMUNITY): Payer: Self-pay | Admitting: Obstetrics and Gynecology

## 2014-01-13 DIAGNOSIS — Z1231 Encounter for screening mammogram for malignant neoplasm of breast: Secondary | ICD-10-CM

## 2014-01-14 ENCOUNTER — Ambulatory Visit: Payer: Medicare Other | Admitting: Physical Therapy

## 2014-01-14 DIAGNOSIS — IMO0001 Reserved for inherently not codable concepts without codable children: Secondary | ICD-10-CM | POA: Diagnosis not present

## 2014-01-18 ENCOUNTER — Ambulatory Visit: Payer: Medicare Other | Admitting: Physical Therapy

## 2014-01-18 DIAGNOSIS — IMO0001 Reserved for inherently not codable concepts without codable children: Secondary | ICD-10-CM | POA: Diagnosis not present

## 2014-01-21 ENCOUNTER — Ambulatory Visit: Payer: Medicare Other | Admitting: Physical Therapy

## 2014-01-21 DIAGNOSIS — IMO0001 Reserved for inherently not codable concepts without codable children: Secondary | ICD-10-CM | POA: Diagnosis not present

## 2014-01-27 ENCOUNTER — Ambulatory Visit (HOSPITAL_COMMUNITY)
Admission: RE | Admit: 2014-01-27 | Discharge: 2014-01-27 | Disposition: A | Discharge status: Home / Self Care | Payer: Medicare Other | Source: Ambulatory Visit | Attending: Obstetrics and Gynecology | Admitting: Obstetrics and Gynecology

## 2014-01-27 ENCOUNTER — Ambulatory Visit: Payer: Medicare Other | Admitting: Physical Therapy

## 2014-01-27 DIAGNOSIS — Z1231 Encounter for screening mammogram for malignant neoplasm of breast: Secondary | ICD-10-CM | POA: Diagnosis present

## 2014-01-27 DIAGNOSIS — IMO0001 Reserved for inherently not codable concepts without codable children: Secondary | ICD-10-CM | POA: Diagnosis not present

## 2014-02-02 ENCOUNTER — Ambulatory Visit: Payer: Medicare Other | Admitting: Physical Therapy

## 2014-02-04 ENCOUNTER — Ambulatory Visit: Payer: Medicare Other | Attending: Orthopedic Surgery | Admitting: Physical Therapy

## 2014-02-04 DIAGNOSIS — R262 Difficulty in walking, not elsewhere classified: Secondary | ICD-10-CM | POA: Diagnosis not present

## 2014-02-04 DIAGNOSIS — M25562 Pain in left knee: Secondary | ICD-10-CM | POA: Diagnosis not present

## 2014-02-04 DIAGNOSIS — R609 Edema, unspecified: Secondary | ICD-10-CM | POA: Diagnosis not present

## 2014-02-04 DIAGNOSIS — M25552 Pain in left hip: Secondary | ICD-10-CM | POA: Insufficient documentation

## 2014-02-04 DIAGNOSIS — Z96642 Presence of left artificial hip joint: Secondary | ICD-10-CM | POA: Diagnosis not present

## 2014-02-04 DIAGNOSIS — M13862 Other specified arthritis, left knee: Secondary | ICD-10-CM | POA: Diagnosis not present

## 2014-02-08 ENCOUNTER — Ambulatory Visit: Payer: Medicare Other | Admitting: Physical Therapy

## 2014-02-08 DIAGNOSIS — M25552 Pain in left hip: Secondary | ICD-10-CM | POA: Diagnosis not present

## 2014-02-12 ENCOUNTER — Ambulatory Visit: Payer: Medicare Other | Admitting: Physical Therapy

## 2014-02-12 DIAGNOSIS — M25552 Pain in left hip: Secondary | ICD-10-CM | POA: Diagnosis not present

## 2014-02-16 ENCOUNTER — Ambulatory Visit (INDEPENDENT_AMBULATORY_CARE_PROVIDER_SITE_OTHER): Payer: Medicare Other | Admitting: Nurse Practitioner

## 2014-02-16 ENCOUNTER — Encounter: Payer: Self-pay | Admitting: Nurse Practitioner

## 2014-02-16 ENCOUNTER — Ambulatory Visit
Admission: RE | Admit: 2014-02-16 | Discharge: 2014-02-16 | Disposition: A | Discharge status: Home / Self Care | Payer: Medicare Other | Source: Ambulatory Visit | Attending: Nurse Practitioner | Admitting: Nurse Practitioner

## 2014-02-16 VITALS — BP 130/90 | HR 76 | Ht 69.0 in | Wt 172.4 lb

## 2014-02-16 DIAGNOSIS — R002 Palpitations: Secondary | ICD-10-CM

## 2014-02-16 DIAGNOSIS — R079 Chest pain, unspecified: Secondary | ICD-10-CM

## 2014-02-16 DIAGNOSIS — R5382 Chronic fatigue, unspecified: Secondary | ICD-10-CM

## 2014-02-16 DIAGNOSIS — R0600 Dyspnea, unspecified: Secondary | ICD-10-CM

## 2014-02-16 LAB — CBC
HCT: 42.3 % (ref 36.0–46.0)
Hemoglobin: 13.6 g/dL (ref 12.0–15.0)
MCHC: 32.2 g/dL (ref 30.0–36.0)
MCV: 85.2 fl (ref 78.0–100.0)
Platelets: 253 10*3/uL (ref 150.0–400.0)
RBC: 4.97 Mil/uL (ref 3.87–5.11)
RDW: 14.8 % (ref 11.5–15.5)
WBC: 4.4 10*3/uL (ref 4.0–10.5)

## 2014-02-16 LAB — BASIC METABOLIC PANEL
BUN: 19 mg/dL (ref 6–23)
CO2: 23 mEq/L (ref 19–32)
Calcium: 9.9 mg/dL (ref 8.4–10.5)
Chloride: 107 mEq/L (ref 96–112)
Creatinine, Ser: 1 mg/dL (ref 0.4–1.2)
GFR: 75.38 mL/min (ref >60.00)
Glucose, Bld: 86 mg/dL (ref 70–99)
Potassium: 4 mEq/L (ref 3.5–5.1)
Sodium: 141 mEq/L (ref 135–145)

## 2014-02-16 LAB — TROPONIN I: Troponin I: <0.3 ng/mL (ref <0.30)

## 2014-02-16 LAB — BRAIN NATRIURETIC PEPTIDE: Pro B Natriuretic peptide (BNP): 39 pg/mL (ref 0.0–100.0)

## 2014-02-16 NOTE — Progress Notes (Signed)
Kimberly PiccoloArlene C Mccullough Date of Birth: 02-02-1946 Medical Record #[id number]  History of Present Illness: [name] is seen back today for a work in visit. Seen for Dr. Clifton JamesMcAlhany. She has HTN, HLD, PTSD and palpitations. Has had lots of stress/anxiety/guilt associated with death of her husband.   I saw her back in the summer with palpitations - I advised her to cut back on her caffeine (was drinking up to 7 cups of coffee per day). Stress echo looked ok at that time except for LVH. She has been hesitant to try statin therapy.   Last seen by Rick DuffBrooke Edmisten, PA back in June - [facility]  [facility] - was complaining of shortness of breath. Seemed nervous. Advised to go to the ER. EKG negative - copy brought in for review. Does not look like she had any blood work yesterday.   Comes back today. Here alone. She notes that for the past week or so she has felt like her breathing was "too fast". Some chest heaviness. Comes and goes - not exertional. She is pretty active - going to PT for her knee and prior hip surgery. Unable to sleep - scared to go to sleep - remains focused on her husband's death and the guilt associated with that. Admits to being sad and lonely. Has had bronchitis about 2 weeks ago. Cough improving. No fever or chills. Not seeing a counselor. She is pretty tearful for today.    Current Outpatient Prescriptions  Medication Sig Dispense Refill  . Calcium Carbonate 1500 MG TABS Take 1,500 mg by mouth daily.      . Glucosamine HCl (SM GLUCOSAMINE HCL) 1500 MG TABS Take 1,500 mg by mouth.      . meloxicam (MOBIC) 15 MG tablet Take 15 mg by mouth.      . metoprolol tartrate (LOPRESSOR) 25 MG tablet Take 25 mg by mouth 2 (two) times daily.      . Omega-3-6-9 CAPS Take 1,000 mg by mouth 2 (two) times daily.       . vitamin E 400 UNIT capsule Take 400 Units by mouth daily.        No current facility-administered  medications for this visit.    Allergies  Allergen Reactions  . Other     Pt is Jehovas Witness and does not want any blood products    Past Medical History  Diagnosis Date  . Hyperlipidemia   . PTSD (post-traumatic stress disorder)   . No blood products   . Shortness of breath     with exertion occasionaly  . Arthritis   . Difficulty sleeping   . Rapid heart beat     takes metoprolol to treat / denies HBP    Past Surgical History  Procedure Laterality Date  . Abdominal hysterectomy    . Cholecystectomy    . Bunionectomy      BILATERAL  . Total hip arthroplasty Left 11/11/2013    Procedure: LEFT TOTAL HIP ARTHROPLASTY ANTERIOR APPROACH;  Surgeon: Loanne DrillingFrank Aluisio V, MD;  Location: [facility];  Service: Orthopedics;  Laterality: Left;    History  Smoking status  . Never Smoker   Smokeless tobacco  . Not on file    History  Alcohol Use No    Family History  Problem Relation Age of Onset  . Dementia Mother   . Liver disease Father   . CAD Neg Hx     Review  of Systems: The review of systems is per the HPI.  All other systems were reviewed and are negative.  Physical Exam: BP 130/90  Pulse 76  Ht 5\' 9"  (1.753 m)  Wt 172 lb 6.4 oz (78.2 kg)  BMI 25.45 kg/m2  SpO2 99% Patient is pleasant and in no acute distress. She is crying. Seems sad. Skin is warm and dry. Color is normal.  HEENT is unremarkable. Normocephalic/atraumatic. PERRL. Sclera are nonicteric. Neck is supple. No masses. No JVD. Lungs are clear. Cardiac exam shows a regular rate and rhythm. Abdomen is soft. Extremities are without edema. Gait and ROM are intact. No gross neurologic deficits noted.  Wt Readings from Last 3 Encounters:  02/16/14 172 lb 6.4 oz (78.2 kg)  11/11/13 178 lb (80.74 kg)  11/11/13 178 lb (80.74 kg)    LABORATORY DATA/PROCEDURES:  Lab Results  Component Value Date   WBC 9.2 11/13/2013   HGB 9.8* 11/13/2013   HCT 28.4* 11/13/2013   PLT 175 11/13/2013   GLUCOSE 110* 11/13/2013    CHOL 222* 11/14/2012   TRIG 61.0 11/14/2012   HDL 53.70 11/14/2012   LDLDIRECT 167.1 11/14/2012   ALT 18 11/14/2012   AST 23 11/14/2012   NA 141 11/13/2013   K 3.7 11/13/2013   CL 106 11/13/2013   CREATININE 0.68 11/13/2013   BUN 19 11/13/2013   CO2 24 11/13/2013   TSH 1.63 10/29/2013   INR 0.93 11/03/2013    BNP (last 3 results) No results found for this basename: PROBNP,  in the last 8760 hours  Stress Echo Study Conclusions from July 2014  - Stress ECG conclusions: There were no stress arrhythmias or conduction abnormalities. The stress ECG was negative for ischemia. - Staged echo: There was no echocardiographic evidence for stress-induced ischemia. - Impressions: Moderate LVH with septal thickness 14.5 mm Impressions:  - Moderate LVH with septal thickness 14.5 mm   Assessment / Plan: 1. HTN -  I have left her on her current regimen.   2. Chest pain and dyspnea - seems more related to her stress/grief/guilt - discussed at length - strongly encouraged her to seek counseling. She denies being suicidal. Checking labs today, send for CXR and arrange for GXT.   3. HLD - not really interested in statin therapy in the past - this was not addressed today.  Patient is agreeable to this plan and will call if any problems develop in the interim.   Rosalio MacadamiaLori C. Margeret Stachnik, RN, ANP-C [facility] [address] [place], [place]  [address] [phone]

## 2014-02-16 NOTE — Patient Instructions (Addendum)
We will be checking the following labs today BMET,CBC, Troponin, BNP  Stay on your current medicines  Will arrange for GXT  [facility] for a chest Xray - you may walk in.   Call the [facility] office at [phone] if you have any questions, problems or concerns.

## 2014-02-19 ENCOUNTER — Other Ambulatory Visit: Payer: Self-pay | Admitting: Cardiovascular Disease

## 2014-03-10 ENCOUNTER — Ambulatory Visit (INDEPENDENT_AMBULATORY_CARE_PROVIDER_SITE_OTHER): Payer: Medicare Other | Admitting: Nurse Practitioner

## 2014-03-10 ENCOUNTER — Encounter: Payer: Self-pay | Admitting: Nurse Practitioner

## 2014-03-10 VITALS — BP 149/102 | HR 99

## 2014-03-10 DIAGNOSIS — R5382 Chronic fatigue, unspecified: Secondary | ICD-10-CM

## 2014-03-10 DIAGNOSIS — R002 Palpitations: Secondary | ICD-10-CM

## 2014-03-10 DIAGNOSIS — R0600 Dyspnea, unspecified: Secondary | ICD-10-CM

## 2014-03-10 NOTE — Progress Notes (Signed)
Exercise Treadmill Test  Pre-Exercise Testing Evaluation Rhythm: sinus tachycardia  Rate: 90 bpm     Test  Exercise Tolerance Test Ordering MD: Melene Mullerhristopher McAlhaney, MD  Interpreting MD: Norma FredricksonLori Gerhardt, NP  Unique Test No: 1  Treadmill:  1  Indication for ETT: exertional dyspnea  Contraindication to ETT: No   Stress Modality: exercise - treadmill  Cardiac Imaging Performed: non   Protocol: standard Bruce - maximal  Max BP:  211/101  Max MPHR (bpm):  153 85% MPR (bpm):  130  MPHR obtained (bpm):  125 % MPHR obtained:  82%  Reached 85% MPHR (min:sec):  NA Total Exercise Time (min-sec):  3 minutes  Workload in METS:  4.6 Borg Scale: 15  Reason ETT Terminated:  exaggerated hypertensive response    ST Segment Analysis At Rest: normal ST segments - no evidence of significant ST depression With Exercise: no evidence of significant ST depression  Other Information Arrhythmia:  Yes Angina during ETT:  absent (0) Quality of ETT:  non-diagnostic  ETT Interpretation:  normal - no evidence of ischemia by ST analysis  Comments: Patient presents today for routine GXT. Has had HTN, HLD, PTSD and palpitations. Negative stress echo from summer of 2014. Has had issues with anxiety, dyspnea, and chest heaviness.   Today the patient exercised on the standard Bruce protocol for a total of 3 minutes.  Reduced exercise tolerance.  Hypertensive blood pressure response -  She did not take her BP medicines today.  Clinically negative for chest pain. Test was stopped due to BP response.  EKG negative for ischemia. No significant arrhythmia noted except for a rare PVC and a short run of PAT.   Recommendations: Continue with beta blocker - needs to take her dose for today  See back as planned  Still encouraged to seek counseling - advised to call [facility]al [facility] with [facility].  Patient is agreeable to this plan and will call if any problems develop in the interim.   Rosalio MacadamiaLori C. Gerhardt, RN, ANP-C [facility] [address] [place], [place]  [address] [phone]

## 2014-04-20 ENCOUNTER — Ambulatory Visit: Payer: Medicare Other | Admitting: Nurse Practitioner

## 2014-05-14 ENCOUNTER — Encounter: Payer: Self-pay | Admitting: Nurse Practitioner

## 2014-05-14 ENCOUNTER — Ambulatory Visit (INDEPENDENT_AMBULATORY_CARE_PROVIDER_SITE_OTHER): Payer: Medicare Other | Admitting: Nurse Practitioner

## 2014-05-14 VITALS — BP 146/94 | HR 69 | Ht 70.0 in | Wt 179.0 lb

## 2014-05-14 DIAGNOSIS — Z01810 Encounter for preprocedural cardiovascular examination: Secondary | ICD-10-CM

## 2014-05-14 NOTE — Progress Notes (Signed)
Cardiology Office Note   Date:  05/14/2014   ID:  Kimberly Mccullough  DOB 08-May-1945  MRN [id number]  PCP:  Eliott Nine, MD  Cardiologist:  Dr. Clifton James   Chief Complaint  Patient presents with  . Pre-op Exam    Surgical clearance - seen for Dr. Clifton James      History of Present Illness: Kimberly Mccullough is a 69 y.o. female who presents for a pre op visit. Seen for Dr. Clifton James. She has HTN, HLD, PTSD and palpitations. Has had lots of stress/anxiety/guilt associated with death of her husband. She is a Jehovah Witness.   I saw her back in the summer of 2015 with palpitations - I advised her to cut back on her caffeine (was drinking up to 7 cups of coffee per day). Stress echo from 2014 looked ok at that time except for LVH. She has been hesitant to try statin therapy.   Last seen by Rick Duff, PA back in June -of 2015 needed surgical clearance for [facility] for July.  Was at the Urgent Care thru [facility] back in October - was complaining of shortness of breath. Seemed nervous. Advised to go to the ER. EKG negative. Most of her issues centered around her anxiety. She had a GXT in November - negative for ischemia but had poor exercise tolerance and BP was up - she had not taken her medicine for that study. Recommended to see Behavioral Health for counseling but she has been very resistant to see anyone.   Comes back today. Here alone. Admits that she is very scared about her upcoming surgery. Really does not want to proceed and may not.  No chest pain. Not short of breath. She will have some heaviness in her chest pain that she attributes to anxiety. Still doing all her own housework. No real exercise since her knee issues but was trying to ride her stationary bike for 30 minutes. Not dizzy or lightheaded. No syncope. Tolerating her medicines. Notes BP is better at home.   Past Medical History  Diagnosis Date  . Hyperlipidemia   . PTSD (post-traumatic stress disorder)   .  No blood products   . Shortness of breath     with exertion occasionaly  . Arthritis   . Difficulty sleeping   . Rapid heart beat     takes metoprolol to treat / denies HBP    Past Surgical History  Procedure Laterality Date  . Abdominal hysterectomy    . Cholecystectomy    . Bunionectomy      BILATERAL  . Total hip arthroplasty Left 11/11/2013    Procedure: LEFT TOTAL HIP ARTHROPLASTY ANTERIOR APPROACH;  Surgeon: Loanne Drilling, MD;  Location: [facility];  Service: Orthopedics;  Laterality: Left;     Current Outpatient Prescriptions  Medication Sig Dispense Refill  . Calcium Carbonate 1500 MG TABS Take 1,500 mg by mouth daily.    . Glucosamine HCl (SM GLUCOSAMINE HCL) 1500 MG TABS Take 1,500 mg by mouth.    . meloxicam (MOBIC) 15 MG tablet Take 15 mg by mouth.    . metoprolol tartrate (LOPRESSOR) 25 MG tablet TAKE ONE TABLET BY MOUTH TWICE DAILY 60 tablet 5  . Omega-3-6-9 CAPS Take 1,000 mg by mouth 2 (two) times daily.     . vitamin E 400 UNIT capsule Take 400 Units by mouth daily.      No current facility-administered medications for this visit.    Allergies:   Other  Social History:  The patient  reports that she has never smoked. She does not have any smokeless tobacco history on file. She reports that she does not drink alcohol or use illicit drugs.   Family History:  The patient's family history includes Dementia in her mother; Liver disease in her father. There is no history of CAD.    ROS:  Please see the history of present illness.   Otherwise, review of systems is + for anxiety.   All other systems are reviewed and negative.    PHYSICAL EXAM: VS:  BP 146/94 mmHg  Pulse 69  Ht 5\' 10"  (1.778 m)  Wt 179 lb (81.194 kg)  BMI 25.68 kg/m2 , BMI Body mass index is 25.68 kg/(m^2). GEN: Well nourished, well developed and in no acute distress. Seems anxious.  HEENT: Normal Neck: Supple.  Cardiac: RRR; no murmurs, rubs, or gallops. She has no edema.  Respiratory:   Clear to auscultation bilaterally with normal work of breathing.  GI: Soft.  MS: No deformity or atrophy. Gait and ROM intact.  Skin: Warm and dry. Color normal.  Neuro:  Strength and sensation are intact with no focal deficits.  Psych: Still seems depressed with flat affect but appropriate.    EKG:  EKG is ordered today. The ekg ordered today demonstrates NSR.    Recent Labs:  Lab Results  Component Value Date   WBC 4.4 02/16/2014   HGB 13.6 02/16/2014   HCT 42.3 02/16/2014   PLT 253.0 02/16/2014   GLUCOSE 86 02/16/2014   CHOL 222* 11/14/2012   TRIG 61.0 11/14/2012   HDL 53.70 11/14/2012   LDLDIRECT 167.1 11/14/2012   ALT 18 11/14/2012   AST 23 11/14/2012   NA 141 02/16/2014   K 4.0 02/16/2014   CL 107 02/16/2014   CREATININE 1.0 02/16/2014   BUN 19 02/16/2014   CO2 23 02/16/2014   TSH 1.63 10/29/2013   INR 0.93 11/03/2013     Wt Readings from Last 3 Encounters:  05/14/14 179 lb (81.194 kg)  02/16/14 172 lb 6.4 oz (78.2 kg)  11/11/13 178 lb (80.74 kg)      Other Studies Reviewed:  GXT from November 2015 Patient presents today for routine GXT. Has had HTN, HLD, PTSD and palpitations. Negative stress echo from summer of 2014. Has had issues with anxiety, dyspnea, and chest heaviness.   Today the patient exercised on the standard Bruce protocol for a total of 3 minutes.  Reduced exercise tolerance.  Hypertensive blood pressure response - She did not take her BP medicines today.  Clinically negative for chest pain. Test was stopped due to BP response.  EKG negative for ischemia. No significant arrhythmia noted except for a rare PVC and a short run of PAT.   Recommendations: Continue with beta blocker - needs to take her dose for today  See back as planned  Still encouraged to seek counseling - advised to call Behavioral Health with [facility].   Stress Echo Study Conclusions from June 2014  - Stress ECG conclusions: There were no stress arrhythmias or  conduction abnormalities. The stress ECG was negative for ischemia. - Staged echo: There was no echocardiographic evidence for stress-induced ischemia. - Impressions: Moderate LVH with septal thickness 14.5 mm Impressions:  - Moderate LVH with septal thickness 14.5 mm  ------------------------------------------------------------  ASSESSMENT AND PLAN: 1. Pre op clearance for TKR - should be an acceptable candidate for her surgery. GXT from November with poor exercise tolerance but no ischemia. Negative stress  echo from 2014. She should be an acceptable candidate for knee surgery - felt to be low risk.   2. HTN -  She has better control at home.  3. Anxiety/stress/PTSD - still a major issue. Once again, advised her to seek counseling  See back as planned in July with fasting labs.   Current medicines are reviewed at length with the patient today.  The patient does not have concerns regarding medicines. No changes made to her current regimen.   Disposition:   See back as planned.    Avelina Laine, NP  05/14/2014 11:58 AM    [facility] [address]  [address] [place], [place]  [address] Phone: [phone]; Fax: [phone]

## 2014-05-14 NOTE — Patient Instructions (Signed)
I think you are a satisfactory candidate for your knee surgery  See me in 6 months with fasting labs  Call the [facility] office at [phone] if you have any questions, problems or concerns.

## 2014-05-25 ENCOUNTER — Other Ambulatory Visit: Payer: Self-pay | Admitting: *Deleted

## 2014-05-25 MED ORDER — METOPROLOL TARTRATE 25 MG PO TABS: 25.0000 mg | ORAL_TABLET | Freq: Two times a day (BID) | ORAL | Status: DC

## 2014-06-04 ENCOUNTER — Other Ambulatory Visit (HOSPITAL_COMMUNITY): Payer: Self-pay | Admitting: Obstetrics and Gynecology

## 2014-06-04 DIAGNOSIS — Z780 Asymptomatic menopausal state: Secondary | ICD-10-CM

## 2014-06-09 ENCOUNTER — Ambulatory Visit (HOSPITAL_COMMUNITY)
Admission: RE | Admit: 2014-06-09 | Discharge: 2014-06-09 | Disposition: A | Discharge status: Home / Self Care | Payer: Medicare Other | Source: Ambulatory Visit | Attending: Obstetrics and Gynecology | Admitting: Obstetrics and Gynecology

## 2014-06-09 DIAGNOSIS — Z780 Asymptomatic menopausal state: Secondary | ICD-10-CM | POA: Insufficient documentation

## 2014-08-02 NOTE — Progress Notes (Signed)
Please put orders in Epic surgery 08-16-14 pre op 08-05-14  Thanks 

## 2014-08-03 ENCOUNTER — Ambulatory Visit: Payer: Self-pay | Admitting: Orthopedic Surgery

## 2014-08-03 NOTE — Patient Instructions (Addendum)
Kimberly Mccullough  08/03/2014   Your procedure is scheduled on: 08/16/14   Report to [facility] Main  Entrance and follow signs to               Short Stay Center at 10:45 AM.   Call this number if you have problems the morning of surgery [phone]   Remember:  Do not eat food  :After Midnight.              MAY HAVE CLEAR LIQUIDS UNTIL 7:45 AM    CLEAR LIQUID DIET   Foods Allowed                                                                     Foods Excluded  Coffee and tea, regular and decaf                             liquids that you cannot  Plain Jell-O in any flavor                                             see through such as: Fruit ices (not with fruit pulp)                                     milk, soups, orange juice  Iced Popsicles                                                All solid food Carbonated beverages, regular and diet                                    Cranberry, grape and apple juices Sports drinks like Gatorade Lightly seasoned clear broth or consume(fat free) Sugar, honey syrup   _____________________________________________________________________    Take these medicines the morning of surgery with A SIP OF WATER: METOPROLOL                               You may not have any metal on your body including hair pins and              piercings  Do not wear jewelry, make-up, lotions, powders or perfumes.             Do not wear nail polish.  Do not shave  48 hours prior to surgery.              Men may shave face and neck.   Do not bring valuables to the hospital. [facility] IS NOT             RESPONSIBLE   FOR VALUABLES.  Contacts, dentures  or bridgework may not be worn into surgery.  Leave suitcase in the car. After surgery it may be brought to your room.     Patients discharged the day of surgery will not be allowed to drive home.  Name and phone number of your driver:  Special Instructions: N/A               Please read over the following fact sheets you were given: _____________________________________________________________________                                                     [place] - PREPARING FOR SURGERY  Before surgery, you can play an important role.  Because skin is not sterile, your skin needs to be as free of germs as possible.  You can reduce the number of germs on your skin by washing with CHG (chlorahexidine gluconate) soap before surgery.  CHG is an antiseptic cleaner which kills germs and bonds with the skin to continue killing germs even after washing. Please DO NOT use if you have an allergy to CHG or antibacterial soaps.  If your skin becomes reddened/irritated stop using the CHG and inform your nurse when you arrive at Short Stay. Do not shave (including legs and underarms) for at least 48 hours prior to the first CHG shower.  You may shave your face. Please follow these instructions carefully:   1.  Shower with CHG Soap the night before surgery and the  morning of Surgery.   2.  If you choose to wash your hair, wash your hair first as usual with your  normal  Shampoo.   3.  After you shampoo, rinse your hair and body thoroughly to remove the  shampoo.                                         4.  Use CHG as you would any other liquid soap.  You can apply chg directly  to the skin and wash . Gently wash with scrungie or clean wascloth    5.  Apply the CHG Soap to your body ONLY FROM THE NECK DOWN.   Do not use on open                           Wound or open sores. Avoid contact with eyes, ears mouth and genitals (private parts).                        Genitals (private parts) with your normal soap.              6.  Wash thoroughly, paying special attention to the area where your surgery  will be performed.   7.  Thoroughly rinse your body with warm water from the neck down.   8.  DO NOT shower/wash with your normal soap after using and rinsing off  the CHG  Soap .                9.  Pat yourself dry with a clean towel.             10.  Wear clean night cloths to bed  after shower             11.  Place clean sheets on your bed the night of your first shower and do not  sleep with pets.  Day of Surgery : Do not apply any lotions/deodorants the morning of surgery.  Please wear clean clothes to the hospital/surgery center.  FAILURE TO FOLLOW THESE INSTRUCTIONS MAY RESULT IN THE CANCELLATION OF YOUR SURGERY    PATIENT SIGNATURE_________________________________  ______________________________________________________________________     Rogelia MireIncentive Spirometer  An incentive spirometer is a tool that can help keep your lungs clear and active. This tool measures how well you are filling your lungs with each breath. Taking long deep breaths may help reverse or decrease the chance of developing breathing (pulmonary) problems (especially infection) following:  A long period of time when you are unable to move or be active. BEFORE THE PROCEDURE   If the spirometer includes an indicator to show your best effort, your nurse or respiratory therapist will set it to a desired goal.  If possible, sit up straight or lean slightly forward. Try not to slouch.  Hold the incentive spirometer in an upright position. INSTRUCTIONS FOR USE  1. Sit on the edge of your bed if possible, or sit up as far as you can in bed or on a chair. 2. Hold the incentive spirometer in an upright position. 3. Breathe out normally. 4. Place the mouthpiece in your mouth and seal your lips tightly around it. 5. Breathe in slowly and as deeply as possible, raising the piston or the ball toward the top of the column. 6. Hold your breath for 3-5 seconds or for as long as possible. Allow the piston or ball to fall to the bottom of the column. 7. Remove the mouthpiece from your mouth and breathe out normally. 8. Rest for a few seconds and repeat Steps 1 through 7 at least 10 times  every 1-2 hours when you are awake. Take your time and take a few normal breaths between deep breaths. 9. The spirometer may include an indicator to show your best effort. Use the indicator as a goal to work toward during each repetition. 10. After each set of 10 deep breaths, practice coughing to be sure your lungs are clear. If you have an incision (the cut made at the time of surgery), support your incision when coughing by placing a pillow or rolled up towels firmly against it. Once you are able to get out of bed, walk around indoors and cough well. You may stop using the incentive spirometer when instructed by your caregiver.  RISKS AND COMPLICATIONS  Take your time so you do not get dizzy or light-headed.  If you are in pain, you may need to take or ask for pain medication before doing incentive spirometry. It is harder to take a deep breath if you are having pain. AFTER USE  Rest and breathe slowly and easily.  It can be helpful to keep track of a log of your progress. Your caregiver can provide you with a simple table to help with this. If you are using the spirometer at home, follow these instructions: SEEK MEDICAL CARE IF:   You are having difficultly using the spirometer.  You have trouble using the spirometer as often as instructed.  Your pain medication is not giving enough relief while using the spirometer.  You develop fever of 100.5 F (38.1 C) or higher. SEEK IMMEDIATE MEDICAL CARE IF:   You cough up bloody  sputum that had not been present before.  You develop fever of 102 F (38.9 C) or greater.  You develop worsening pain at or near the incision site. MAKE SURE YOU:   Understand these instructions.  Will watch your condition.  Will get help right away if you are not doing well or get worse. Document Released: 08/27/2006 Document Revised: 07/09/2011 Document Reviewed: 10/28/2006 [employer] Patient Information 2014 [place],  [place].   ________________________________________________________________________

## 2014-08-03 NOTE — Progress Notes (Signed)
Preoperative surgical orders have been place into the Epic hospital system for Tilman Neatrlene C Dobbin on 08/03/2014, 12:50 PM  by Patrica DuelPERKINS, Kena Limon for surgery on 08-16-2014.  Preop Total Knee orders including Experal, IV Tylenol, and IV Decadron as long as there are no contraindications to the above medications. Avel Peacerew Roselee Tayloe, PA-C

## 2014-08-05 ENCOUNTER — Encounter (HOSPITAL_COMMUNITY): Payer: Self-pay

## 2014-08-05 ENCOUNTER — Encounter (HOSPITAL_COMMUNITY)
Admission: RE | Admit: 2014-08-05 | Discharge: 2014-08-05 | Disposition: A | Discharge status: Home / Self Care | Payer: Medicare Other | Source: Ambulatory Visit | Attending: Orthopedic Surgery | Admitting: Orthopedic Surgery

## 2014-08-05 DIAGNOSIS — Z01812 Encounter for preprocedural laboratory examination: Secondary | ICD-10-CM | POA: Insufficient documentation

## 2014-08-05 HISTORY — DX: Tinnitus, unspecified ear: H93.19

## 2014-08-05 HISTORY — DX: Other specified disorders of bone density and structure, unspecified site: M85.80

## 2014-08-05 HISTORY — DX: Essential (primary) hypertension: I10

## 2014-08-05 LAB — COMPREHENSIVE METABOLIC PANEL
ALT: 16 U/L (ref 0–35)
AST: 24 U/L (ref 0–37)
Albumin: 4.1 g/dL (ref 3.5–5.2)
Alkaline Phosphatase: 59 U/L (ref 39–117)
Anion gap: 6 (ref 5–15)
BUN: 22 mg/dL (ref 6–23)
CALCIUM: 9.3 mg/dL (ref 8.4–10.5)
CO2: 26 mmol/L (ref 19–32)
CREATININE: 0.91 mg/dL (ref 0.50–1.10)
Chloride: 108 mmol/L (ref 96–112)
GFR calc Af Amer: 74 mL/min — ABNORMAL LOW (ref >90)
GFR, EST NON AFRICAN AMERICAN: 64 mL/min — AB (ref >90)
GLUCOSE: 94 mg/dL (ref 70–99)
POTASSIUM: 4.2 mmol/L (ref 3.5–5.1)
SODIUM: 140 mmol/L (ref 135–145)
Total Bilirubin: 0.4 mg/dL (ref 0.3–1.2)
Total Protein: 6.8 g/dL (ref 6.0–8.3)

## 2014-08-05 LAB — CBC
HCT: 40.4 % (ref 36.0–46.0)
HEMOGLOBIN: 13.7 g/dL (ref 12.0–15.0)
MCH: 29.5 pg (ref 26.0–34.0)
MCHC: 33.9 g/dL (ref 30.0–36.0)
MCV: 87.1 fL (ref 78.0–100.0)
Platelets: 206 10*3/uL (ref 150–400)
RBC: 4.64 MIL/uL (ref 3.87–5.11)
RDW: 13.2 % (ref 11.5–15.5)
WBC: 4.1 10*3/uL (ref 4.0–10.5)

## 2014-08-05 LAB — PROTIME-INR
INR: 0.96 (ref 0.00–1.49)
PROTHROMBIN TIME: 12.9 s (ref 11.6–15.2)

## 2014-08-05 LAB — URINE MICROSCOPIC-ADD ON

## 2014-08-05 LAB — URINALYSIS, ROUTINE W REFLEX MICROSCOPIC
Bilirubin Urine: NEGATIVE
Glucose, UA: NEGATIVE mg/dL
Hgb urine dipstick: NEGATIVE
Ketones, ur: NEGATIVE mg/dL
Nitrite: POSITIVE — AB
PH: 5.5 (ref 5.0–8.0)
Protein, ur: NEGATIVE mg/dL
SPECIFIC GRAVITY, URINE: 1.015 (ref 1.005–1.030)
Urobilinogen, UA: 0.2 mg/dL (ref 0.0–1.0)

## 2014-08-05 LAB — SURGICAL PCR SCREEN
MRSA, PCR: NEGATIVE
STAPHYLOCOCCUS AUREUS: NEGATIVE

## 2014-08-05 LAB — APTT: aPTT: 31 seconds (ref 24–37)

## 2014-08-05 NOTE — Progress Notes (Signed)
Abnormal UA faxed to Dr.Aluisio 

## 2014-08-06 LAB — NO BLOOD PRODUCTS

## 2014-08-15 ENCOUNTER — Ambulatory Visit: Payer: Self-pay | Admitting: Orthopedic Surgery

## 2014-08-15 NOTE — H&P (Signed)
Kimberly Mccullough DOB: 07-12-1945 Widowed / Language: English / Race: Black or African American Female Date of Admission:  08/16/2014 CC:  Left Knee Pain History of Present Illness  The patient is a 69 year old female who comes in  for a preoperative History and Physical. The patient is scheduled for a left total knee arthroplasty to be performed by Dr. Gus RankinFrank V. Aluisio, MD on 08-16-2014. The patient is a 69 year old female who presents today for follow up of their knee. The patient is being followed for their left knee osteoarthritis. They are now several months out from cortisone injection (that did provide some relief of pain, but still notes weakness). The following medication has been used for pain control: antiinflammatory medication (Meloxicam). The knee does feel slightly better than it did previously. She still has a lot of trouble with the knee. She feels like she cannot trust it. She feels like it locks on her frequently with inclines. She has a hard time bending and squatting. She did some physical therapy and did feel a little better with that. They basically worked on stretching the knee. Her hip continues to do very well. They have been treated conservatively in the past for the above stated problem and despite conservative measures, they continue to have progressive pain and severe functional limitations and dysfunction. They have failed non-operative management including home exercise, medications, and injections. It is felt that they would benefit from undergoing total joint replacement. Risks and benefits of the procedure have been discussed with the patient and they elect to proceed with surgery. There are no active contraindications to surgery such as ongoing infection or rapidly progressive neurological disease.  Problem List/Past Medical  Primary osteoarthritis of one knee, left (M17.12) Status post left hip replacement (Z61.096(Z96.642) Urinary tract infection, bacterial  (N39.0) Tinnitus Bronchitis Hypercholesterolemia Varicose veins Menopause Osteopenia  Allergies  No Known Drug Allergies  Family History Cancer Mother. Heart Disease Maternal Grandmother.  Social History Number of flights of stairs before winded 2-3 Never consumed alcohol 07/08/2013: Never consumed alcohol Tobacco use Never smoker. 07/08/2013 Not under pain contract No history of drug/alcohol rehab Exercise Exercises rarely; does other Marital status widowed Current work status working full time Children 3  Medication History Meloxicam (15MG  Tablet, 1 (one) Oral daily, Taken starting 05/25/2014) Active. Metoprolol Tartrate (25MG  Tablet, Oral) Active. (BID) Terbinafine HCl (250MG  Tablet, Oral) Active. Aspirin EC (Oral two times daily) Specific dose unknown - Active.  Past Surgical History  Gallbladder Surgery open Hysterectomy complete (non-cancerous) Foot Surgery bilateral Dilation and Curettage of Uterus Total Hip Replacement - Left anterior approach   Review of Systems General Not Present- Chills, Fatigue, Fever, Memory Loss, Night Sweats, Weight Gain and Weight Loss. Skin Not Present- Eczema, Hives, Itching, Lesions and Rash. HEENT Present- Tinnitus. Not Present- Dentures, Double Vision, Headache, Hearing Loss and Visual Loss. Respiratory Not Present- Allergies, Chronic Cough, Coughing up blood, Shortness of breath at rest and Shortness of breath with exertion. Cardiovascular Not Present- Chest Pain, Difficulty Breathing Lying Down, Murmur, Palpitations, Racing/skipping heartbeats and Swelling. Gastrointestinal Not Present- Abdominal Pain, Bloody Stool, Constipation, Diarrhea, Difficulty Swallowing, Heartburn, Jaundice, Loss of appetitie, Nausea and Vomiting. Female Genitourinary Not Present- Blood in Urine, Discharge, Flank Pain, Incontinence, Painful Urination, Urgency, Urinary frequency, Urinary Retention, Urinating at Night and Weak  urinary stream. Musculoskeletal Present- Joint Pain, Joint Swelling, Morning Stiffness and Muscle Weakness. Not Present- Back Pain, Muscle Pain and Spasms. Neurological Not Present- Blackout spells, Difficulty with balance, Dizziness, Paralysis, Tremor  and Weakness. Psychiatric Not Present- Insomnia.  Vitals Weight: 173 lb Height: 70in Weight was reported by patient. Height was reported by patient. Body Surface Area: 1.96 m Body Mass Index: 24.82 kg/m  BP: 124/70 (Sitting, Right Arm, Standard)  Physical Exam General Mental Status -Alert, cooperative and good historian. General Appearance-pleasant, Not in acute distress. Orientation-Oriented X3. Build & Nutrition-Well nourished and Well developed.  Head and Neck Head-normocephalic, atraumatic . Neck Global Assessment - supple, no bruit auscultated on the right, no bruit auscultated on the left.  Eye Pupil - Bilateral-Regular and Round. Motion - Bilateral-EOMI.  Chest and Lung Exam Auscultation Breath sounds - clear at anterior chest wall and clear at posterior chest wall. Adventitious sounds - No Adventitious sounds.  Cardiovascular Auscultation Rhythm - Regular rate and rhythm. Heart Sounds - S1 WNL and S2 WNL. Murmurs & Other Heart Sounds - Auscultation of the heart reveals - No Murmurs.  Abdomen Palpation/Percussion Tenderness - Abdomen is non-tender to palpation. Rigidity (guarding) - Abdomen is soft. Auscultation Auscultation of the abdomen reveals - Bowel sounds normal.  Female Genitourinary Note: Not done, not pertinent to present illness   Musculoskeletal Note: She is alert and oriented, in no apparent distress. Her left hip flexes to 120, rotates in 30 and out 40, abducts to 40 without discomfort. The left knee with no effusion, moderate crepitus on range of motion, slight valgus, range 5 to 125. There is no instability noted.  Radiographs were reviewed and she does have significant  bone-on-bone arthritis in the medial, lateral, and patellofemoral compartments of the left knee.  Assessment & Plan  Primary osteoarthritis of one knee, left (M17.12) Note:Surgical Plans: Left Total Knee Repalcement  Disposition: Home  PCP: Dr. Elige Radon - Patient has been seen preoperatively and felt to be stable for surgery.  IV TXA  Anesthesia Issues: None  Signed electronically by Lauraine Rinne, III PA-C

## 2014-08-16 ENCOUNTER — Encounter (HOSPITAL_COMMUNITY): Payer: Self-pay | Admitting: *Deleted

## 2014-08-16 ENCOUNTER — Inpatient Hospital Stay (HOSPITAL_COMMUNITY): Payer: Medicare Other | Admitting: Certified Registered"

## 2014-08-16 ENCOUNTER — Encounter (HOSPITAL_COMMUNITY)
Admission: RE | Disposition: A | Discharge status: Home / Self Care | Payer: Self-pay | Source: Ambulatory Visit | Attending: Orthopedic Surgery

## 2014-08-16 ENCOUNTER — Inpatient Hospital Stay (HOSPITAL_COMMUNITY)
Admission: RE | Admit: 2014-08-16 | Discharge: 2014-08-19 | DRG: 470 | Disposition: A | Discharge status: Home / Self Care | Payer: Medicare Other | Source: Ambulatory Visit | Attending: Orthopedic Surgery | Admitting: Orthopedic Surgery

## 2014-08-16 DIAGNOSIS — Z96642 Presence of left artificial hip joint: Secondary | ICD-10-CM | POA: Diagnosis present

## 2014-08-16 DIAGNOSIS — M179 Osteoarthritis of knee, unspecified: Secondary | ICD-10-CM | POA: Diagnosis present

## 2014-08-16 DIAGNOSIS — I10 Essential (primary) hypertension: Secondary | ICD-10-CM | POA: Diagnosis present

## 2014-08-16 DIAGNOSIS — F4310 Post-traumatic stress disorder, unspecified: Secondary | ICD-10-CM | POA: Diagnosis present

## 2014-08-16 DIAGNOSIS — E785 Hyperlipidemia, unspecified: Secondary | ICD-10-CM | POA: Diagnosis present

## 2014-08-16 DIAGNOSIS — I8390 Asymptomatic varicose veins of unspecified lower extremity: Secondary | ICD-10-CM | POA: Diagnosis present

## 2014-08-16 DIAGNOSIS — M8580 Other specified disorders of bone density and structure, unspecified site: Secondary | ICD-10-CM | POA: Diagnosis present

## 2014-08-16 DIAGNOSIS — Z90710 Acquired absence of both cervix and uterus: Secondary | ICD-10-CM | POA: Diagnosis not present

## 2014-08-16 DIAGNOSIS — M1710 Unilateral primary osteoarthritis, unspecified knee: Secondary | ICD-10-CM | POA: Diagnosis present

## 2014-08-16 DIAGNOSIS — M25562 Pain in left knee: Secondary | ICD-10-CM | POA: Diagnosis present

## 2014-08-16 DIAGNOSIS — M1712 Unilateral primary osteoarthritis, left knee: Principal | ICD-10-CM | POA: Diagnosis present

## 2014-08-16 HISTORY — PX: TOTAL KNEE ARTHROPLASTY: SHX125

## 2014-08-16 SURGERY — ARTHROPLASTY, KNEE, TOTAL
Anesthesia: Spinal | Laterality: Left

## 2014-08-16 MED ORDER — DOCUSATE SODIUM 100 MG PO CAPS
100.0000 mg | ORAL_CAPSULE | Freq: Two times a day (BID) | ORAL | Status: DC
  Administered 2014-08-16 – 2014-08-19 (×6): 100 mg via ORAL

## 2014-08-16 MED ORDER — DEXAMETHASONE SODIUM PHOSPHATE 10 MG/ML IJ SOLN
10.0000 mg | Freq: Once | INTRAMUSCULAR | Status: AC
  Administered 2014-08-17: 10 mg via INTRAVENOUS
  Filled 2014-08-16: qty 1

## 2014-08-16 MED ORDER — BUPIVACAINE IN DEXTROSE 0.75-8.25 % IT SOLN
INTRATHECAL | Status: DC | PRN
  Administered 2014-08-16: 1.8 mL via INTRATHECAL

## 2014-08-16 MED ORDER — ACETAMINOPHEN 500 MG PO TABS
1000.0000 mg | ORAL_TABLET | Freq: Four times a day (QID) | ORAL | Status: AC
  Administered 2014-08-16: 1000 mg via ORAL
  Administered 2014-08-17: 500 mg via ORAL
  Administered 2014-08-17 (×2): 1000 mg via ORAL
  Filled 2014-08-16 (×5): qty 2

## 2014-08-16 MED ORDER — ONDANSETRON HCL 4 MG PO TABS: 4.0000 mg | ORAL_TABLET | Freq: Four times a day (QID) | ORAL | Status: DC | PRN

## 2014-08-16 MED ORDER — METOPROLOL TARTRATE 25 MG PO TABS
25.0000 mg | ORAL_TABLET | Freq: Two times a day (BID) | ORAL | Status: DC
  Administered 2014-08-16 – 2014-08-19 (×3): 25 mg via ORAL
  Filled 2014-08-16 (×7): qty 1

## 2014-08-16 MED ORDER — DEXTROSE 5 % IV SOLN
500.0000 mg | Freq: Four times a day (QID) | INTRAVENOUS | Status: DC | PRN
  Administered 2014-08-16: 500 mg via INTRAVENOUS
  Filled 2014-08-16 (×2): qty 5

## 2014-08-16 MED ORDER — PHENYLEPHRINE 40 MCG/ML (10ML) SYRINGE FOR IV PUSH (FOR BLOOD PRESSURE SUPPORT)
PREFILLED_SYRINGE | INTRAVENOUS | Status: AC
  Filled 2014-08-16: qty 10

## 2014-08-16 MED ORDER — ACETAMINOPHEN 10 MG/ML IV SOLN
1000.0000 mg | Freq: Once | INTRAVENOUS | Status: AC
  Administered 2014-08-16: 1000 mg via INTRAVENOUS
  Filled 2014-08-16: qty 100

## 2014-08-16 MED ORDER — ACETAMINOPHEN 650 MG RE SUPP: 650.0000 mg | Freq: Four times a day (QID) | RECTAL | Status: DC | PRN

## 2014-08-16 MED ORDER — DIPHENHYDRAMINE HCL 12.5 MG/5ML PO ELIX: 12.5000 mg | ORAL_SOLUTION | ORAL | Status: DC | PRN

## 2014-08-16 MED ORDER — SODIUM CHLORIDE 0.9 % IJ SOLN
INTRAMUSCULAR | Status: DC | PRN
  Administered 2014-08-16: 30 mL via INTRAVENOUS

## 2014-08-16 MED ORDER — ONDANSETRON HCL 4 MG/2ML IJ SOLN
INTRAMUSCULAR | Status: AC
  Filled 2014-08-16: qty 2

## 2014-08-16 MED ORDER — METOCLOPRAMIDE HCL 10 MG PO TABS: 5.0000 mg | ORAL_TABLET | Freq: Three times a day (TID) | ORAL | Status: DC | PRN

## 2014-08-16 MED ORDER — PROPOFOL 10 MG/ML IV BOLUS
INTRAVENOUS | Status: DC | PRN
  Administered 2014-08-16: 20 mg via INTRAVENOUS

## 2014-08-16 MED ORDER — METHOCARBAMOL 500 MG PO TABS
500.0000 mg | ORAL_TABLET | Freq: Four times a day (QID) | ORAL | Status: DC | PRN
  Administered 2014-08-17 – 2014-08-19 (×6): 500 mg via ORAL
  Filled 2014-08-16 (×7): qty 1

## 2014-08-16 MED ORDER — BUPIVACAINE LIPOSOME 1.3 % IJ SUSP
INTRAMUSCULAR | Status: DC | PRN
  Administered 2014-08-16: 20 mL

## 2014-08-16 MED ORDER — KETOROLAC TROMETHAMINE 15 MG/ML IJ SOLN: 7.5000 mg | Freq: Four times a day (QID) | INTRAMUSCULAR | Status: AC | PRN

## 2014-08-16 MED ORDER — LACTATED RINGERS IV SOLN
INTRAVENOUS | Status: DC
  Administered 2014-08-16: 13:00:00 via INTRAVENOUS
  Administered 2014-08-16: 1000 mL via INTRAVENOUS

## 2014-08-16 MED ORDER — TRAMADOL HCL 50 MG PO TABS
50.0000 mg | ORAL_TABLET | Freq: Four times a day (QID) | ORAL | Status: DC | PRN
  Administered 2014-08-18 – 2014-08-19 (×4): 100 mg via ORAL
  Filled 2014-08-16 (×4): qty 2

## 2014-08-16 MED ORDER — MEPERIDINE HCL 50 MG/ML IJ SOLN: 6.2500 mg | INTRAMUSCULAR | Status: DC | PRN

## 2014-08-16 MED ORDER — HYDROMORPHONE HCL 1 MG/ML IJ SOLN: 0.2500 mg | INTRAMUSCULAR | Status: DC | PRN

## 2014-08-16 MED ORDER — RIVAROXABAN 10 MG PO TABS
10.0000 mg | ORAL_TABLET | Freq: Every day | ORAL | Status: DC
  Administered 2014-08-17 – 2014-08-19 (×3): 10 mg via ORAL
  Filled 2014-08-16 (×4): qty 1

## 2014-08-16 MED ORDER — CHLORHEXIDINE GLUCONATE 4 % EX LIQD: 60.0000 mL | Freq: Once | CUTANEOUS | Status: DC

## 2014-08-16 MED ORDER — MENTHOL 3 MG MT LOZG
1.0000 | LOZENGE | OROMUCOSAL | Status: DC | PRN
Start: 2014-08-16 — End: 2014-08-19

## 2014-08-16 MED ORDER — MIDAZOLAM HCL 2 MG/2ML IJ SOLN
INTRAMUSCULAR | Status: AC
  Filled 2014-08-16: qty 2

## 2014-08-16 MED ORDER — MORPHINE SULFATE 2 MG/ML IJ SOLN: 1.0000 mg | INTRAMUSCULAR | Status: DC | PRN

## 2014-08-16 MED ORDER — POLYETHYLENE GLYCOL 3350 17 G PO PACK: 17.0000 g | PACK | Freq: Every day | ORAL | Status: DC | PRN

## 2014-08-16 MED ORDER — PHENYLEPHRINE HCL 10 MG/ML IJ SOLN
INTRAMUSCULAR | Status: DC | PRN
  Administered 2014-08-16 (×2): 80 ug via INTRAVENOUS

## 2014-08-16 MED ORDER — SODIUM CHLORIDE 0.9 % IV SOLN: INTRAVENOUS | Status: DC

## 2014-08-16 MED ORDER — BUPIVACAINE HCL (PF) 0.25 % IJ SOLN
INTRAMUSCULAR | Status: AC
  Filled 2014-08-16: qty 30

## 2014-08-16 MED ORDER — ONDANSETRON HCL 4 MG/2ML IJ SOLN
INTRAMUSCULAR | Status: DC | PRN
  Administered 2014-08-16: 4 mg via INTRAVENOUS

## 2014-08-16 MED ORDER — PROPOFOL INFUSION 10 MG/ML OPTIME
INTRAVENOUS | Status: DC | PRN
  Administered 2014-08-16: 100 ug/kg/min via INTRAVENOUS

## 2014-08-16 MED ORDER — FLEET ENEMA 7-19 GM/118ML RE ENEM: 1.0000 | ENEMA | Freq: Once | RECTAL | Status: AC | PRN

## 2014-08-16 MED ORDER — ONDANSETRON HCL 4 MG/2ML IJ SOLN: 4.0000 mg | Freq: Four times a day (QID) | INTRAMUSCULAR | Status: DC | PRN

## 2014-08-16 MED ORDER — TRANEXAMIC ACID 1000 MG/10ML IV SOLN
1000.0000 mg | INTRAVENOUS | Status: AC
  Administered 2014-08-16: 1000 mg via INTRAVENOUS
  Filled 2014-08-16: qty 10

## 2014-08-16 MED ORDER — CEFAZOLIN SODIUM-DEXTROSE 2-3 GM-% IV SOLR
INTRAVENOUS | Status: AC
  Filled 2014-08-16: qty 50

## 2014-08-16 MED ORDER — BUPIVACAINE LIPOSOME 1.3 % IJ SUSP
20.0000 mL | Freq: Once | INTRAMUSCULAR | Status: DC
  Filled 2014-08-16: qty 20

## 2014-08-16 MED ORDER — BISACODYL 10 MG RE SUPP: 10.0000 mg | Freq: Every day | RECTAL | Status: DC | PRN

## 2014-08-16 MED ORDER — TERBINAFINE HCL 250 MG PO TABS
250.0000 mg | ORAL_TABLET | Freq: Every day | ORAL | Status: DC
  Administered 2014-08-16 – 2014-08-19 (×4): 250 mg via ORAL
  Filled 2014-08-16 (×4): qty 1

## 2014-08-16 MED ORDER — PROPOFOL 10 MG/ML IV BOLUS
INTRAVENOUS | Status: AC
  Filled 2014-08-16: qty 20

## 2014-08-16 MED ORDER — DEXAMETHASONE SODIUM PHOSPHATE 10 MG/ML IJ SOLN
10.0000 mg | Freq: Once | INTRAMUSCULAR | Status: AC
  Administered 2014-08-16: 10 mg via INTRAVENOUS

## 2014-08-16 MED ORDER — ACETAMINOPHEN 325 MG PO TABS
650.0000 mg | ORAL_TABLET | Freq: Four times a day (QID) | ORAL | Status: DC | PRN
  Administered 2014-08-18: 650 mg via ORAL
  Filled 2014-08-16: qty 2

## 2014-08-16 MED ORDER — OXYCODONE HCL 5 MG PO TABS
5.0000 mg | ORAL_TABLET | ORAL | Status: DC | PRN
  Administered 2014-08-16: 10 mg via ORAL
  Administered 2014-08-16: 5 mg via ORAL
  Administered 2014-08-17 (×2): 10 mg via ORAL
  Administered 2014-08-17 (×3): 5 mg via ORAL
  Administered 2014-08-17 – 2014-08-18 (×3): 10 mg via ORAL
  Administered 2014-08-18: 5 mg via ORAL
  Administered 2014-08-18: 10 mg via ORAL
  Filled 2014-08-16 (×4): qty 2
  Filled 2014-08-16 (×2): qty 1
  Filled 2014-08-16 (×2): qty 2
  Filled 2014-08-16: qty 1
  Filled 2014-08-16 (×4): qty 2

## 2014-08-16 MED ORDER — CEFAZOLIN SODIUM-DEXTROSE 2-3 GM-% IV SOLR
2.0000 g | Freq: Four times a day (QID) | INTRAVENOUS | Status: AC
  Administered 2014-08-16 – 2014-08-17 (×2): 2 g via INTRAVENOUS
  Filled 2014-08-16 (×2): qty 50

## 2014-08-16 MED ORDER — PHENOL 1.4 % MT LIQD
1.0000 | OROMUCOSAL | Status: DC | PRN
  Filled 2014-08-16: qty 177

## 2014-08-16 MED ORDER — FENTANYL CITRATE (PF) 100 MCG/2ML IJ SOLN
INTRAMUSCULAR | Status: DC | PRN
  Administered 2014-08-16 (×2): 50 ug via INTRAVENOUS

## 2014-08-16 MED ORDER — SODIUM CHLORIDE 0.9 % IJ SOLN
INTRAMUSCULAR | Status: AC
  Filled 2014-08-16: qty 50

## 2014-08-16 MED ORDER — FENTANYL CITRATE (PF) 100 MCG/2ML IJ SOLN
INTRAMUSCULAR | Status: AC
  Filled 2014-08-16: qty 2

## 2014-08-16 MED ORDER — DEXTROSE-NACL 5-0.9 % IV SOLN
INTRAVENOUS | Status: DC
  Administered 2014-08-16: 16:00:00 via INTRAVENOUS

## 2014-08-16 MED ORDER — CEFAZOLIN SODIUM-DEXTROSE 2-3 GM-% IV SOLR
2.0000 g | INTRAVENOUS | Status: AC
  Administered 2014-08-16: 2 g via INTRAVENOUS

## 2014-08-16 MED ORDER — SODIUM CHLORIDE 0.9 % IR SOLN
Status: DC | PRN
  Administered 2014-08-16: 1000 mL

## 2014-08-16 MED ORDER — BUPIVACAINE HCL 0.25 % IJ SOLN
INTRAMUSCULAR | Status: DC | PRN
  Administered 2014-08-16: 20 mL

## 2014-08-16 MED ORDER — 0.9 % SODIUM CHLORIDE (POUR BTL) OPTIME
TOPICAL | Status: DC | PRN
  Administered 2014-08-16: 1000 mL

## 2014-08-16 MED ORDER — METOCLOPRAMIDE HCL 5 MG/ML IJ SOLN
5.0000 mg | Freq: Three times a day (TID) | INTRAMUSCULAR | Status: DC | PRN
Start: 2014-08-16 — End: 2014-08-19

## 2014-08-16 MED ORDER — ONDANSETRON HCL 4 MG/2ML IJ SOLN: 4.0000 mg | Freq: Once | INTRAMUSCULAR | Status: DC | PRN

## 2014-08-16 MED ORDER — DEXAMETHASONE SODIUM PHOSPHATE 10 MG/ML IJ SOLN
INTRAMUSCULAR | Status: AC
  Filled 2014-08-16: qty 1

## 2014-08-16 SURGICAL SUPPLY — 64 items
BAG DECANTER FOR FLEXI CONT (MISCELLANEOUS) ×3 IMPLANT
BAG SPEC THK2 15X12 ZIP CLS (MISCELLANEOUS) ×1
BAG ZIPLOCK 12X15 (MISCELLANEOUS) ×3 IMPLANT
BANDAGE ELASTIC 6 VELCRO ST LF (GAUZE/BANDAGES/DRESSINGS) ×3 IMPLANT
BANDAGE ESMARK 6X9 LF (GAUZE/BANDAGES/DRESSINGS) ×1 IMPLANT
BLADE SAG 18X100X1.27 (BLADE) ×3 IMPLANT
BLADE SAW SGTL 11.0X1.19X90.0M (BLADE) ×3 IMPLANT
BNDG CMPR 9X6 STRL LF SNTH (GAUZE/BANDAGES/DRESSINGS) ×1
BNDG ESMARK 6X9 LF (GAUZE/BANDAGES/DRESSINGS) ×3
BOWL SMART MIX CTS (DISPOSABLE) ×3 IMPLANT
CAPT KNEE TOTAL 3 ATTUNE ×2 IMPLANT
CEMENT HV SMART SET (Cement) ×6 IMPLANT
CLOSURE WOUND 1/2 X4 (GAUZE/BANDAGES/DRESSINGS) ×1
CUFF TOURN SGL QUICK 34 (TOURNIQUET CUFF) ×3
CUFF TRNQT CYL 34X4X40X1 (TOURNIQUET CUFF) ×1 IMPLANT
DECANTER SPIKE VIAL GLASS SM (MISCELLANEOUS) ×3 IMPLANT
DRAPE EXTREMITY T 121X128X90 (DRAPE) ×3 IMPLANT
DRAPE POUCH INSTRU U-SHP 10X18 (DRAPES) ×3 IMPLANT
DRAPE U-SHAPE 47X51 STRL (DRAPES) ×3 IMPLANT
DRSG ADAPTIC 3X8 NADH LF (GAUZE/BANDAGES/DRESSINGS) ×3 IMPLANT
DRSG PAD ABDOMINAL 8X10 ST (GAUZE/BANDAGES/DRESSINGS) ×3 IMPLANT
DURAPREP 26ML APPLICATOR (WOUND CARE) ×3 IMPLANT
ELECT REM PT RETURN 9FT ADLT (ELECTROSURGICAL) ×3
ELECTRODE REM PT RTRN 9FT ADLT (ELECTROSURGICAL) ×1 IMPLANT
EVACUATOR 1/8 PVC DRAIN (DRAIN) ×3 IMPLANT
FACESHIELD WRAPAROUND (MASK) ×15 IMPLANT
FACESHIELD WRAPAROUND OR TEAM (MASK) ×5 IMPLANT
GAUZE SPONGE 4X4 12PLY STRL (GAUZE/BANDAGES/DRESSINGS) ×3 IMPLANT
GLOVE BIO SURGEON STRL SZ7.5 (GLOVE) IMPLANT
GLOVE BIO SURGEON STRL SZ8 (GLOVE) ×3 IMPLANT
GLOVE BIOGEL PI IND STRL 6.5 (GLOVE) IMPLANT
GLOVE BIOGEL PI IND STRL 8 (GLOVE) ×1 IMPLANT
GLOVE BIOGEL PI INDICATOR 6.5 (GLOVE)
GLOVE BIOGEL PI INDICATOR 8 (GLOVE) ×2
GLOVE SURG SS PI 6.5 STRL IVOR (GLOVE) IMPLANT
GOWN STRL REUS W/TWL LRG LVL3 (GOWN DISPOSABLE) ×3 IMPLANT
GOWN STRL REUS W/TWL XL LVL3 (GOWN DISPOSABLE) IMPLANT
HANDPIECE INTERPULSE COAX TIP (DISPOSABLE) ×3
IMMOBILIZER KNEE 20 (SOFTGOODS) ×3
IMMOBILIZER KNEE 20 THIGH 36 (SOFTGOODS) ×1 IMPLANT
KIT BASIN OR (CUSTOM PROCEDURE TRAY) ×3 IMPLANT
MANIFOLD NEPTUNE II (INSTRUMENTS) ×3 IMPLANT
NDL SAFETY ECLIPSE 18X1.5 (NEEDLE) ×2 IMPLANT
NEEDLE HYPO 18GX1.5 SHARP (NEEDLE) ×6
NS IRRIG 1000ML POUR BTL (IV SOLUTION) ×3 IMPLANT
PACK TOTAL JOINT (CUSTOM PROCEDURE TRAY) ×3 IMPLANT
PADDING CAST COTTON 6X4 STRL (CAST SUPPLIES) ×5 IMPLANT
PEN SKIN MARKING BROAD (MISCELLANEOUS) ×3 IMPLANT
POSITIONER SURGICAL ARM (MISCELLANEOUS) ×3 IMPLANT
SET HNDPC FAN SPRY TIP SCT (DISPOSABLE) ×1 IMPLANT
STRIP CLOSURE SKIN 1/2X4 (GAUZE/BANDAGES/DRESSINGS) ×3 IMPLANT
SUCTION FRAZIER 12FR DISP (SUCTIONS) ×3 IMPLANT
SUT MNCRL AB 4-0 PS2 18 (SUTURE) ×3 IMPLANT
SUT VIC AB 2-0 CT1 27 (SUTURE) ×9
SUT VIC AB 2-0 CT1 TAPERPNT 27 (SUTURE) ×3 IMPLANT
SUT VLOC 180 0 24IN GS25 (SUTURE) ×3 IMPLANT
SYR 20CC LL (SYRINGE) ×3 IMPLANT
SYR 50ML LL SCALE MARK (SYRINGE) ×3 IMPLANT
TOWEL OR 17X26 10 PK STRL BLUE (TOWEL DISPOSABLE) ×3 IMPLANT
TOWEL OR NON WOVEN STRL DISP B (DISPOSABLE) IMPLANT
TRAY FOLEY W/METER SILVER 14FR (SET/KITS/TRAYS/PACK) ×3 IMPLANT
WATER STERILE IRR 1500ML POUR (IV SOLUTION) ×3 IMPLANT
WRAP KNEE MAXI GEL POST OP (GAUZE/BANDAGES/DRESSINGS) ×3 IMPLANT
YANKAUER SUCT BULB TIP 10FT TU (MISCELLANEOUS) ×3 IMPLANT

## 2014-08-16 NOTE — Op Note (Signed)
Pre-operative diagnosis- Osteoarthritis Left knee(s)  Post-operative diagnosis- Osteoarthritis  Left knee(s)  Procedure-   Left Total Knee Arthroplasty  Surgeon- Gus Rankin. Coty Student, MD  Assistant- Avel Peace, PA-C   Anesthesia-  Spinal   EBL- * No blood loss amount entered *   Drains Hemovac   Tourniquet time  Total Tourniquet Time Documented: Thigh (Left) - 34 minutes Total: Thigh (Left) - 34 minutes    Complications- None  Condition-PACU - hemodynamically stable.   Brief Clinical Note  Kimberly Mccullough is a 69 y.o. year old female with end stage OA of her left knee with progressively worsening pain and dysfunction. She has constant pain, with activity and at rest and significant functional deficits with difficulties even with ADLs. She has had extensive non-op management including analgesics, injections of cortisone, and home exercise program, but remains in significant pain with significant dysfunction. Radiographs show bone on bone arthritis lateral and patellofemoral with valgus deformity. She presents now for left Total Knee Arthroplasty.    Procedure in detail---       The patient is brought into the operating room and positioned supine on the operating table. After successful administration of Spinal anesthetic, a tourniquet is placed high on the Left thigh(s) and the lower extremity is prepped and draped in the usual sterile fashion. Time out is performed by the operating team and then the Left  lower extremity is wrapped in Esmarch, knee flexed and the tourniquet inflated to 300 mmHg.       A midline incision is made with a ten blade through the subcutaneous tissue to the level of the extensor mechanism. A fresh blade is used to make a lateral parapatellar arthrotomy due to the patients' valgus deformity. Soft tissue over the proximal lateral tibia is subperiosteally elevated to the joint line with a knife to the posterolateral corner but not including the structures of  the posterolateral corner. Soft tissue over the proximal medial tibia is elevated with attention being paid to avoiding the patellar tendon on the tibial tubercle. The patella is everted medially, knee flexed 90 degrees and the ACL and PCL are removed. Findings are bone on bone lateral and patellofemoral with large lateral and patellar osteophytes .       The drill is used to create a starting hole in the distal femur and the canal is thoroughly irrigated with sterile saline to remove the fatty contents. The 5 degree Left  valgus alignment guide is placed into the femoral canal and the distal femoral cutting block is pinned to remove 9  mm off the distal femur. Resection is made with an oscillating saw.      The tibia is subluxed forward and the menisci are removed. The extramedullary alignment guide is placed referencing proximally at the medial aspect of the tibial tubercle and distally along the second metatarsal axis and tibial crest. The block is pinned to remove 2mm off the more deficient lateral side. Resection is made with an oscillating saw. Size 6  is the most appropriate size for the tibia and the proximal tibia is prepared with the modular drill and keel punch for that size.      The femoral sizing guide is placed and size 6  is most appropriate. Rotation is marked off the epicondylar axis and confirmed by creating a rectangular flexion gap at 90 degrees. The size 6  cutting block is pinned in this rotation and the anterior, posterior and chamfer cuts are made with the oscillating saw.  The intercondylar block is then placed and that cut is made.      Trial size 6  tibial component, trial size 6  posterior stabilized femur and a 6  mm posterior stabilized rotating platform insert trial is placed. Full extension is achieved with excellent varus/valgus and   anterior/posterior balance throughout full range of motion. The patella is everted and thickness measured to be 22  mm. Free hand resection is  taken to 12 mm, a 38 template is placed, lug holes are drilled, trial patella is placed, and it tracks normally. Osteophytes are removed off the posterior femur with the trial in place. All trials are removed and the cut bone surfaces prepared with pulsatile lavage. Cement is mixed and once ready for implantation, the size 6  tibial implant, size 6 posterior stabilized femoral component, and the size 38  patella are cemented in place and the patella is held with the clamp. The trial insert is placed and the knee held in full extension. The Exparel (20 ml mixed with 30 ml saline) and then 20 ml of .25% Bupivicaine is injected into the extensor mechanism, posterior capsule, medial and lateral gutters and subcutaneous tissues. All extruded cement is removed and once the cement is hard the permanent 6  mm posterior stabilized rotating platform insert is placed into the tibial tray.      The wound is copiously irrigated with saline solution and the tourniquet is released for a total   tourniquet time of 34  minutes. Bleeding is identified and controlled with electrocautery. The extensor mechanism is closed with interrupted #1 V-loc leaving open a small area from the superior to inferior pole of the patella to serve as a mini lateral release. Flexion against gravity is 140  degrees and the patella tracks normally. Subcutaneous tissue is closed with 2.0 vicryl and subcuticular with running 4.0 Monocryl.The incision is cleaned and dried and steri-strips and a bulky sterile dressing are applied. The limb is placed into a knee immobilizer and the patient is awakened and transported to recovery in stable condition.      Please note that a surgical assistant was a medical necessity for this procedure in order to perform it in a safe and expeditious manner. Surgical assistant was necessary to retract the ligaments and vital neurovascular structures to prevent injury to them and also necessary for proper positioning of the limb  to allow for anatomic placement of the prosthesis.    Gus RankinFrank V. Brandt Chaney, MD    08/16/2014, 1:41 PM

## 2014-08-16 NOTE — Anesthesia Preprocedure Evaluation (Signed)
Anesthesia Evaluation  Patient identified by MRN, date of birth, ID band Patient awake    Reviewed: Allergy & Precautions, NPO status , Patient's Chart, lab work & pertinent test results  Airway Mallampati: II  TM Distance: >3 FB Neck ROM: Full    Dental no notable dental hx.    Pulmonary neg pulmonary ROS,  breath sounds clear to auscultation  Pulmonary exam normal       Cardiovascular hypertension, Pt. on medications negative cardio ROS  Rhythm:Regular Rate:Normal     Neuro/Psych negative neurological ROS  negative psych ROS   GI/Hepatic negative GI ROS, Neg liver ROS,   Endo/Other  negative endocrine ROS  Renal/GU negative Renal ROS  negative genitourinary   Musculoskeletal negative musculoskeletal ROS (+)   Abdominal   Peds negative pediatric ROS (+)  Hematology negative hematology ROS (+)   Anesthesia Other Findings   Reproductive/Obstetrics negative OB ROS                             Anesthesia Physical Anesthesia Plan  ASA: III  Anesthesia Plan: Spinal   Post-op Pain Management:    Induction: Intravenous  Airway Management Planned: Oral ETT  Additional Equipment:   Intra-op Plan:   Post-operative Plan: Extubation in OR  Informed Consent: I have reviewed the patients History and Physical, chart, labs and discussed the procedure including the risks, benefits and alternatives for the proposed anesthesia with the patient or authorized representative who has indicated his/her understanding and acceptance.     Plan Discussed with: CRNA and Surgeon  Anesthesia Plan Comments:         Anesthesia Quick Evaluation

## 2014-08-16 NOTE — Interval H&P Note (Signed)
History and Physical Interval Note:  08/16/2014 11:48 AM  Kimberly Mccullough  has presented today for surgery, with the diagnosis of OA LEFT KNEE  The various methods of treatment have been discussed with the patient and family. After consideration of risks, benefits and other options for treatment, the patient has consented to  Procedure(s): LEFT TOTAL KNEE ARTHROPLASTY (Left) as a surgical intervention .  The patient's history has been reviewed, patient examined, no change in status, stable for surgery.  I have reviewed the patient's chart and labs.  Questions were answered to the patient's satisfaction.     Loanne DrillingALUISIO,Donnice Nielsen V

## 2014-08-16 NOTE — H&P (View-Only) (Signed)
Kimberly Mccullough DOB: 11/04/1945 Widowed / Language: English / Race: Black or African American Female Date of Admission:  08/16/2014 CC:  Left Knee Pain History of Present Illness  The patient is a 68 year old female who comes in  for a preoperative History and Physical. The patient is scheduled for a left total knee arthroplasty to be performed by Dr. Frank V. Aluisio, MD on 08-16-2014. The patient is a 68 year old female who presents today for follow up of their knee. The patient is being followed for their left knee osteoarthritis. They are now several months out from cortisone injection (that did provide some relief of pain, but still notes weakness). The following medication has been used for pain control: antiinflammatory medication (Meloxicam). The knee does feel slightly better than it did previously. She still has a lot of trouble with the knee. She feels like she cannot trust it. She feels like it locks on her frequently with inclines. She has a hard time bending and squatting. She did some physical therapy and did feel a little better with that. They basically worked on stretching the knee. Her hip continues to do very well. They have been treated conservatively in the past for the above stated problem and despite conservative measures, they continue to have progressive pain and severe functional limitations and dysfunction. They have failed non-operative management including home exercise, medications, and injections. It is felt that they would benefit from undergoing total joint replacement. Risks and benefits of the procedure have been discussed with the patient and they elect to proceed with surgery. There are no active contraindications to surgery such as ongoing infection or rapidly progressive neurological disease.  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 Heart Disease Maternal Grandmother.  Social History Number of flights of stairs before winded 2-3 Never consumed alcohol 07/08/2013: Never consumed alcohol Tobacco use Never smoker. 07/08/2013 Not under pain contract No history of drug/alcohol rehab Exercise Exercises rarely; does other Marital status widowed Current work status working full time Children 3  Medication History Meloxicam (15MG Tablet, 1 (one) Oral daily, Taken starting 05/25/2014) Active. Metoprolol Tartrate (25MG Tablet, Oral) Active. (BID) Terbinafine HCl (250MG Tablet, Oral) Active. Aspirin EC (Oral two times daily) Specific dose unknown - Active.  Past Surgical History  Gallbladder Surgery open Hysterectomy complete (non-cancerous) Foot Surgery bilateral Dilation and Curettage of Uterus Total Hip Replacement - Left anterior approach   Review of Systems General Not Present- Chills, Fatigue, Fever, Memory Loss, Night Sweats, Weight Gain and Weight Loss. Skin Not Present- Eczema, Hives, Itching, Lesions and Rash. HEENT Present- Tinnitus. Not Present- Dentures, Double Vision, Headache, Hearing Loss and Visual Loss. Respiratory Not Present- Allergies, Chronic Cough, Coughing up blood, Shortness of breath at rest and Shortness of breath with exertion. Cardiovascular Not Present- Chest Pain, Difficulty Breathing Lying Down, Murmur, Palpitations, Racing/skipping heartbeats and Swelling. Gastrointestinal Not Present- Abdominal Pain, Bloody Stool, Constipation, Diarrhea, Difficulty Swallowing, Heartburn, Jaundice, Loss of appetitie, Nausea and Vomiting. Female Genitourinary Not Present- Blood in Urine, Discharge, Flank Pain, Incontinence, Painful Urination, Urgency, Urinary frequency, Urinary Retention, Urinating at Night and Weak  urinary stream. Musculoskeletal Present- Joint Pain, Joint Swelling, Morning Stiffness and Muscle Weakness. Not Present- Back Pain, Muscle Pain and Spasms. Neurological Not Present- Blackout spells, Difficulty with balance, Dizziness, Paralysis, Tremor   and Weakness. Psychiatric Not Present- Insomnia.  Vitals Weight: 173 lb Height: 70in Weight was reported by patient. Height was reported by patient. Body Surface Area: 1.96 m Body Mass Index: 24.82 kg/m  BP: 124/70 (Sitting, Right Arm, Standard)  Physical Exam General Mental Status -Alert, cooperative and good historian. General Appearance-pleasant, Not in acute distress. Orientation-Oriented X3. Build & Nutrition-Well nourished and Well developed.  Head and Neck Head-normocephalic, atraumatic . Neck Global Assessment - supple, no bruit auscultated on the right, no bruit auscultated on the left.  Eye Pupil - Bilateral-Regular and Round. Motion - Bilateral-EOMI.  Chest and Lung Exam Auscultation Breath sounds - clear at anterior chest wall and clear at posterior chest wall. Adventitious sounds - No Adventitious sounds.  Cardiovascular Auscultation Rhythm - Regular rate and rhythm. Heart Sounds - S1 WNL and S2 WNL. Murmurs & Other Heart Sounds - Auscultation of the heart reveals - No Murmurs.  Abdomen Palpation/Percussion Tenderness - Abdomen is non-tender to palpation. Rigidity (guarding) - Abdomen is soft. Auscultation Auscultation of the abdomen reveals - Bowel sounds normal.  Female Genitourinary Note: Not done, not pertinent to present illness   Musculoskeletal Note: She is alert and oriented, in no apparent distress. Her left hip flexes to 120, rotates in 30 and out 40, abducts to 40 without discomfort. The left knee with no effusion, moderate crepitus on range of motion, slight valgus, range 5 to 125. There is no instability noted.  Radiographs were reviewed and she does have significant  bone-on-bone arthritis in the medial, lateral, and patellofemoral compartments of the left knee.  Assessment & Plan  Primary osteoarthritis of one knee, left (M17.12) Note:Surgical Plans: Left Total Knee Repalcement  Disposition: Home  PCP: Dr. Bradley - Patient has been seen preoperatively and felt to be stable for surgery.  IV TXA  Anesthesia Issues: None  Signed electronically by Alexzandrew L Perkins, III PA-C 

## 2014-08-16 NOTE — Transfer of Care (Signed)
Immediate Anesthesia Transfer of Care Note  Patient: Kimberly Mccullough [place]  Procedure(s) Performed: Procedure(s): LEFT TOTAL KNEE ARTHROPLASTY (Left)  Patient Location: PACU  Anesthesia Type:Spinal  Level of Consciousness:  sedated, patient cooperative and responds to stimulation  Airway & Oxygen Therapy:Patient Spontanous Breathing and Patient connected to face mask oxgen  Post-op Assessment:  Report given to PACU RN and Post -op Vital signs reviewed and stable  Post vital signs:  Reviewed and stable  Last Vitals:  Filed Vitals:   08/16/14 1103  BP: 139/87  Pulse: 74  Temp: 36.4 Mccullough  Resp: 18    Complications: No apparent anesthesia complications

## 2014-08-16 NOTE — Anesthesia Procedure Notes (Signed)
Spinal Patient location during procedure: OR Start time: 08/16/2014 12:34 PM End time: 08/16/2014 12:37 PM Staffing Anesthesiologist: Lillia Abed Resident/CRNA: Lajuana Carry E Performed by: resident/CRNA  Preanesthetic Checklist Completed: patient identified, site marked, surgical consent, pre-op evaluation, timeout performed, IV checked, risks and benefits discussed and monitors and equipment checked Spinal Block Patient position: sitting Prep: Betadine Patient monitoring: heart rate, continuous pulse ox and blood pressure Location: L3-4 Injection technique: single-shot Needle Needle type: Spinocan and Sprotte  Needle gauge: 24 G Needle length: 10 cm Assessment Sensory level: T6 Additional Notes Expiration date of kit checked and confirmed. Clear CSF pre/post injection. Patient tolerated procedure well, without complications.

## 2014-08-16 NOTE — Anesthesia Postprocedure Evaluation (Signed)
Anesthesia Post Note  Patient: Kimberly Mccullough  Procedure(s) Performed: Procedure(s) (LRB): LEFT TOTAL KNEE ARTHROPLASTY (Left)  Anesthesia type: general  Patient location: PACU  Post pain: Pain level controlled  Post assessment: Patient's Cardiovascular Status Stable  Last Vitals:  Filed Vitals:   08/16/14 1515  BP: 130/90  Pulse: 65  Temp: 36.7 C  Resp: 14    Post vital signs: Reviewed and stable  Level of consciousness: sedated  Complications: No apparent anesthesia complications

## 2014-08-16 NOTE — Progress Notes (Signed)
Utilization review completed.  

## 2014-08-17 LAB — BASIC METABOLIC PANEL
Anion gap: 6 (ref 5–15)
BUN: 23 mg/dL (ref 6–23)
CALCIUM: 8.4 mg/dL (ref 8.4–10.5)
CO2: 25 mmol/L (ref 19–32)
Chloride: 108 mmol/L (ref 96–112)
Creatinine, Ser: 0.75 mg/dL (ref 0.50–1.10)
GFR calc non Af Amer: 86 mL/min — ABNORMAL LOW (ref >90)
GLUCOSE: 157 mg/dL — AB (ref 70–99)
Potassium: 4 mmol/L (ref 3.5–5.1)
Sodium: 139 mmol/L (ref 135–145)

## 2014-08-17 LAB — CBC
HCT: 31.9 % — ABNORMAL LOW (ref 36.0–46.0)
Hemoglobin: 10.9 g/dL — ABNORMAL LOW (ref 12.0–15.0)
MCH: 29.6 pg (ref 26.0–34.0)
MCHC: 34.2 g/dL (ref 30.0–36.0)
MCV: 86.7 fL (ref 78.0–100.0)
Platelets: 214 10*3/uL (ref 150–400)
RBC: 3.68 MIL/uL — ABNORMAL LOW (ref 3.87–5.11)
RDW: 12.9 % (ref 11.5–15.5)
WBC: 7.9 10*3/uL (ref 4.0–10.5)

## 2014-08-17 MED ORDER — SODIUM CHLORIDE 0.9 % IV BOLUS (SEPSIS)
250.0000 mL | Freq: Once | INTRAVENOUS | Status: AC
  Administered 2014-08-17: 250 mL via INTRAVENOUS

## 2014-08-17 NOTE — Progress Notes (Signed)
Physical Therapy Treatment Patient Details Name: Kimberly Mccullough MRN: [id number] DOB: Feb 17, 1946 Today's Date: 08/17/2014    History of Present Illness L TKA    PT Comments    Patient expressing concern that her L foot is continuing to turn inward as before surgery. Deferred patient concern to MD. Patient asking many questions related to pain in knee also.  Follow Up Recommendations  Home health PT;Supervision/Assistance - 24 hour     Equipment Recommendations  None recommended by PT    Recommendations for Other Services       Precautions / Restrictions Precautions Precautions: Knee;Fall Required Braces or Orthoses: Knee Immobilizer - Left Knee Immobilizer - Left: Discontinue once straight leg raise with < 10 degree lag    Mobility  Bed Mobility Overal bed mobility: Needs Assistance Bed Mobility: Sit to Supine       Sit to supine: Min assist   General bed mobility comments: support LLE  Transfers Overall transfer level: Needs assistance Equipment used: Rolling walker (2 wheeled) Transfers: Sit to/from Stand Sit to Stand: +2 physical assistance         General transfer comment: cues for hand placement  Ambulation/Gait Ambulation/Gait assistance: Min assist;+2 safety/equipment Ambulation Distance (Feet): 60 Feet Assistive device: Rolling walker (2 wheeled) Gait Pattern/deviations: Step-to pattern;Antalgic     General Gait Details: cues for sequence , increase  weight   Stairs            Wheelchair Mobility    Modified Rankin (Stroke Patients Only)       Balance                                    Cognition Arousal/Alertness: Awake/alert                          Exercises      General Comments        Pertinent Vitals/Pain Pain Score: 7  Pain Location: L knee Pain Descriptors / Indicators: Aching    Home Living                      Prior Function            PT Goals (current goals can  now be found in the care plan section) Progress towards PT goals: Progressing toward goals    Frequency  7X/week    PT Plan Current plan remains appropriate    Co-evaluation             End of Session Equipment Utilized During Treatment: Left knee immobilizer Activity Tolerance: No increased pain Patient left: in bed;with call bell/phone within reach     Time: 1335-1356 PT Time Calculation (min) (ACUTE ONLY): 21 min  Charges:  $Gait Training: 8-22 mins                    G Codes:      Kimberly Mccullough, Kimberly Mccullough 08/17/2014, 4:44 PM

## 2014-08-17 NOTE — Progress Notes (Signed)
OT Cancellation Note  Patient Details Name: Kimberly Mccullough MRN: [id number] DOB: 01-Feb-1946   Cancelled Treatment:    Reason Eval/Treat Not Completed: Other (comment).  Pt was dizzy with PT.  BP increasing but pt prefers to wait until later for OT.  Will check back.  Mechel Schutter 08/17/2014, 12:30 PM  Marica OtterMaryellen Audriella Blakeley, OTR/L [phone] 08/17/2014

## 2014-08-17 NOTE — Discharge Instructions (Addendum)
° °Dr. Frank Aluisio °Total Joint Specialist °[facility] °[address] °[place], [place] [address] °[phone] ° °TOTAL KNEE REPLACEMENT POSTOPERATIVE DIRECTIONS ° °Knee Rehabilitation, Guidelines Following Surgery  °Results after knee surgery are often greatly improved when you follow the exercise, range of motion and muscle strengthening exercises prescribed by your doctor. Safety measures are also important to protect the knee from further injury. Any time any of these exercises cause you to have increased pain or swelling in your knee joint, decrease the amount until you are comfortable again and slowly increase them. If you have problems or questions, call your caregiver or physical therapist for advice.  ° °HOME CARE INSTRUCTIONS  °Remove items at home which could result in a fall. This includes throw rugs or furniture in walking pathways.  °· ICE to the affected knee every three hours for 30 minutes at a time and then as needed for pain and swelling.  Continue to use ice on the knee for pain and swelling from surgery. You may notice swelling that will progress down to the foot and ankle.  This is normal after surgery.  Elevate the leg when you are not up walking on it.   °· Continue to use the breathing machine which will help keep your temperature down.  It is common for your temperature to cycle up and down following surgery, especially at night when you are not up moving around and exerting yourself.  The breathing machine keeps your lungs expanded and your temperature down. °· Do not place pillow under knee, focus on keeping the knee straight while resting ° °DIET °You may resume your previous home diet once your are discharged from the hospital. ° °DRESSING / WOUND CARE / SHOWERING °You may change your dressing 3-5 days after surgery.  Then change the dressing every day with sterile gauze.  Please use good hand washing techniques before changing the dressing.  Do not use any  lotions or creams on the incision until instructed by your surgeon. °You may start showering once you are discharged home but do not submerge the incision under water. Just pat the incision dry and apply a dry gauze dressing on daily. °Change the surgical dressing daily and reapply a dry dressing each time. ° °ACTIVITY °Walk with your walker as instructed. °Use walker as long as suggested by your caregivers. °Avoid periods of inactivity such as sitting longer than an hour when not asleep. This helps prevent blood clots.  °You may resume a sexual relationship in one month or when given the OK by your doctor.  °You may return to work once you are cleared by your doctor.  °Do not drive a car for 6 weeks or until released by you surgeon.  °Do not drive while taking narcotics. ° °WEIGHT BEARING °Weight bearing as tolerated with assist device (walker, cane, etc) as directed, use it as long as suggested by your surgeon or therapist, typically at least 4-6 weeks. ° °POSTOPERATIVE CONSTIPATION PROTOCOL °Constipation - defined medically as fewer than three stools per week and severe constipation as less than one stool per week. ° °One of the most common issues patients have following surgery is constipation.  Even if you have a regular bowel pattern at home, your normal regimen is likely to be disrupted due to multiple reasons following surgery.  Combination of anesthesia, postoperative narcotics, change in appetite and fluid intake all can affect your bowels.  In order to avoid complications following surgery, here are some recommendations   in order to help you during your recovery period. ° °Colace (docusate) - Pick up an over-the-counter form of Colace or another stool softener and take twice a day as long as you are requiring postoperative pain medications.  Take with a full glass of water daily.  If you experience loose stools or diarrhea, hold the colace until you stool forms back up.  If your symptoms do not get better  within 1 week or if they get worse, check with your doctor. ° °Dulcolax (bisacodyl) - Pick up over-the-counter and take as directed by the product packaging as needed to assist with the movement of your bowels.  Take with a full glass of water.  Use this product as needed if not relieved by Colace only.  ° °MiraLax (polyethylene glycol) - Pick up over-the-counter to have on hand.  MiraLax is a solution that will increase the amount of water in your bowels to assist with bowel movements.  Take as directed and can mix with a glass of water, juice, soda, coffee, or tea.  Take if you go more than two days without a movement. °Do not use MiraLax more than once per day. Call your doctor if you are still constipated or irregular after using this medication for 7 days in a row. ° °If you continue to have problems with postoperative constipation, please contact the office for further assistance and recommendations.  If you experience "the worst abdominal pain ever" or develop nausea or vomiting, please contact the office immediatly for further recommendations for treatment. ° °ITCHING ° If you experience itching with your medications, try taking only a single pain pill, or even half a pain pill at a time.  You can also use Benadryl over the counter for itching or also to help with sleep.  ° °TED HOSE STOCKINGS °Wear the elastic stockings on both legs for three weeks following surgery during the day but you may remove then at night for sleeping. ° °MEDICATIONS °See your medication summary on the “After Visit Summary” that the nursing staff will review with you prior to discharge.  You may have some home medications which will be placed on hold until you complete the course of blood thinner medication.  It is important for you to complete the blood thinner medication as prescribed by your surgeon.  Continue your approved medications as instructed at time of discharge. ° °PRECAUTIONS °If you experience chest pain or shortness  of breath - call 911 immediately for transfer to the hospital emergency department.  °If you develop a fever greater that 101 F, purulent drainage from wound, increased redness or drainage from wound, foul odor from the wound/dressing, or calf pain - CONTACT YOUR SURGEON.   °                                                °FOLLOW-UP APPOINTMENTS °Make sure you keep all of your appointments after your operation with your surgeon and caregivers. You should call the office at the above phone number and make an appointment for approximately two weeks after the date of your surgery or on the date instructed by your surgeon outlined in the "After Visit Summary". ° ° °RANGE OF MOTION AND STRENGTHENING EXERCISES  °Rehabilitation of the knee is important following a knee injury or an operation. After just a few days of immobilization, the muscles   of the thigh which control the knee become weakened and shrink (atrophy). Knee exercises are designed to build up the tone and strength of the thigh muscles and to improve knee motion. Often times heat used for twenty to thirty minutes before working out will loosen up your tissues and help with improving the range of motion but do not use heat for the first two weeks following surgery. These exercises can be done on a training (exercise) mat, on the floor, on a table or on a bed. Use what ever works the best and is most comfortable for you Knee exercises include:  °Leg Lifts - While your knee is still immobilized in a splint or cast, you can do straight leg raises. Lift the leg to 60 degrees, hold for 3 sec, and slowly lower the leg. Repeat 10-20 times 2-3 times daily. Perform this exercise against resistance later as your knee gets better.  °Quad and Hamstring Sets - Tighten up the muscle on the front of the thigh (Quad) and hold for 5-10 sec. Repeat this 10-20 times hourly. Hamstring sets are done by pushing the foot backward against an object and holding for 5-10 sec. Repeat as  with quad sets.  °· Leg Slides: Lying on your back, slowly slide your foot toward your buttocks, bending your knee up off the floor (only go as far as is comfortable). Then slowly slide your foot back down until your leg is flat on the floor again. °· Angel Wings: Lying on your back spread your legs to the side as far apart as you can without causing discomfort.  °A rehabilitation program following serious knee injuries can speed recovery and prevent re-injury in the future due to weakened muscles. Contact your doctor or a physical therapist for more information on knee rehabilitation.  ° °IF YOU ARE TRANSFERRED TO A SKILLED REHAB FACILITY °If the patient is transferred to a skilled rehab facility following release from the hospital, a list of the current medications will be sent to the facility for the patient to continue.  When discharged from the skilled rehab facility, please have the facility set up the patient's Home Health Physical Therapy prior to being released. Also, the skilled facility will be responsible for providing the patient with their medications at time of release from the facility to include their pain medication, the muscle relaxants, and their blood thinner medication. If the patient is still at the rehab facility at time of the two week follow up appointment, the skilled rehab facility will also need to assist the patient in arranging follow up appointment in our office and any transportation needs. ° °MAKE SURE YOU:  °Understand these instructions.  °Get help right away if you are not doing well or get worse.  ° ° °Pick up stool softner and laxative for home use following surgery while on pain medications. °Do not submerge incision under water. °Please use good hand washing techniques while changing dressing each day. °May shower starting three days after surgery. °Please use a clean towel to pat the incision dry following showers. °Continue to use ice for pain and swelling after  surgery. °Do not use any lotions or creams on the incision until instructed by your surgeon. ° °Take Xarelto for two and a half more weeks, then discontinue Xarelto. °Once the patient has completed the blood thinner regimen, then take a Baby 81 mg Aspirin daily for three more weeks. ° ° ° °Information on my medicine - XARELTO® (Rivaroxaban) ° °This medication education   was reviewed with me or my healthcare representative as part of my discharge preparation.   ° °Why was Xarelto® prescribed for you? °Xarelto® was prescribed for you to reduce the risk of blood clots forming after orthopedic surgery. The medical term for these abnormal blood clots is venous thromboembolism (VTE). ° °What do you need to know about xarelto® ? °Take your Xarelto® ONCE DAILY at the same time every day. °You may take it either with or without food. ° °If you have difficulty swallowing the tablet whole, you may crush it and mix in applesauce just prior to taking your dose. ° °Take Xarelto® exactly as prescribed by your doctor and DO NOT stop taking Xarelto® without talking to the doctor who prescribed the medication.  Stopping without other VTE prevention medication to take the place of Xarelto® may increase your risk of developing a clot. ° °After discharge, you should have regular check-up appointments with your healthcare provider that is prescribing your Xarelto®.   ° °What do you do if you miss a dose? °If you miss a dose, take it as soon as you remember on the same day then continue your regularly scheduled once daily regimen the next day. Do not take two doses of Xarelto® on the same day.  ° °Important Safety Information °A possible side effect of Xarelto® is bleeding. You should call your healthcare provider right away if you experience any of the following: °? Bleeding from an injury or your nose that does not stop. °? Unusual colored urine (red or dark brown) or unusual colored stools (red or black). °? Unusual bruising for unknown  reasons. °? A serious fall or if you hit your head (even if there is no bleeding). ° °Some medicines may interact with Xarelto® and might increase your risk of bleeding while on Xarelto®. To help avoid this, consult your healthcare provider or pharmacist prior to using any new prescription or non-prescription medications, including herbals, vitamins, non-steroidal anti-inflammatory drugs (NSAIDs) and supplements. ° °This website has more information on Xarelto®: www.xarelto.com. ° ° °

## 2014-08-17 NOTE — Evaluation (Signed)
Physical Therapy Evaluation Patient Details Name: Kimberly Mccullough MRN: [id number] DOB: [birth year] Today's Date: 08/17/2014   History of Present Illness  L TKA  Clinical Impression  Patient has had low BP this AM. Getting bolus of fluids, felt "weak" after standing so assisted to recliner. BP 104/62. Patient will benefit from PT to address problems listed in note below.    Follow Up Recommendations Home health PT;Supervision/Assistance - 24 hour    Equipment Recommendations  None recommended by PT    Recommendations for Other Services       Precautions / Restrictions Precautions Precautions: Knee;Fall Required Braces or Orthoses: Knee Immobilizer - Left Knee Immobilizer - Left: Discontinue once straight leg raise with < 10 degree lag Restrictions Weight Bearing Restrictions: No      Mobility  Bed Mobility Overal bed mobility: Needs Assistance Bed Mobility: Supine to Sit     Supine to sit: Min assist     General bed mobility comments: support LLE  Transfers Overall transfer level: Needs assistance Equipment used: Rolling walker (2 wheeled) Transfers: Sit to/from Stand Sit to Stand: +2 safety/equipment;Min assist;From elevated surface         General transfer comment: cues for hand placement  Ambulation/Gait                Stairs            Wheelchair Mobility    Modified Rankin (Stroke Patients Only)       Balance                                             Pertinent Vitals/Pain Pain Assessment: 0-10 Pain Score: 4  Pain Location: L knee when moved Pain Descriptors / Indicators: Aching Pain Intervention(s): Limited activity within patient's tolerance;Monitored during session;Premedicated before session;Ice applied    Home Living Family/patient expects to be discharged to:: Private residence Living Arrangements: Children Available Help at Discharge: Family Type of Home: House Home Access: Level entry      Home Layout: One level Home Equipment: Environmental consultantWalker - 2 wheels;Cane - single point;Bedside commode      Prior Function Level of Independence: Independent               Hand Dominance        Extremity/Trunk Assessment               Lower Extremity Assessment: LLE deficits/detail   LLE Deficits / Details: assisyt needed for SLR, knee flexion 45 degr.  Cervical / Trunk Assessment: Normal  Communication      Cognition Arousal/Alertness: Awake/alert Behavior During Therapy: WFL for tasks assessed/performed Overall Cognitive Status: Within Functional Limits for tasks assessed                      General Comments      Exercises        Assessment/Plan    PT Assessment Patient needs continued PT services  PT Diagnosis Difficulty walking;Acute pain   PT Problem List Decreased strength;Decreased range of motion;Decreased activity tolerance;Decreased mobility;Decreased knowledge of precautions;Decreased safety awareness;Decreased knowledge of use of DME  PT Treatment Interventions DME instruction;Gait training;Functional mobility training;Therapeutic activities;Therapeutic exercise;Patient/family education   PT Goals (Current goals can be found in the Care Plan section) Acute Rehab PT Goals Patient Stated Goal: to go home PT Goal Formulation: With patient Time For Goal Achievement:  08/21/14 Potential to Achieve Goals: Good    Frequency 7X/week   Barriers to discharge        Co-evaluation               End of Session Equipment Utilized During Treatment: Left knee immobilizer Activity Tolerance: Treatment limited secondary to medical complications (Comment) ("weak" , has had low BP) Patient left: in chair;with call bell/phone within reach Nurse Communication: Mobility status (BP 104/62)         Time: 1610-9604 PT Time Calculation (min) (ACUTE ONLY): 34 min   Charges:   PT Evaluation $Initial PT Evaluation Tier I: 1 Procedure PT  Treatments $Gait Training: 8-22 mins   PT G Codes:        Rada Hay 08/17/2014, 11:46 AM Blanchard Kelch PT [phone]

## 2014-08-17 NOTE — Progress Notes (Signed)
   Subjective: 1 Day Post-Op Procedure(s) (LRB): LEFT TOTAL KNEE ARTHROPLASTY (Left) Patient reports pain as mild and moderate.   Patient seen in rounds with Dr. Lequita HaltAluisio. Patient is well, but has had some minor complaints of pain in the knee, requiring pain medications We will start therapy today.  Plan is to go Home after hospital stay.  Objective: Vital signs in last 24 hours: Temp:  [97.4 F (36.3 C)-98.4 F (36.9 C)] 98.2 F (36.8 C) (04/19 0536) Pulse Rate:  [61-79] 65 (04/19 0536) Resp:  [11-18] 16 (04/19 0536) BP: (104-139)/(64-90) 108/75 mmHg (04/19 0536) SpO2:  [95 %-100 %] 98 % (04/19 0536) Weight:  [82.555 kg (182 lb)] 82.555 kg (182 lb) (04/18 1133)  Intake/Output from previous day:  Intake/Output Summary (Last 24 hours) at 08/17/14 0848 Last data filed at 08/17/14 0607  Gross per 24 hour  Intake 3602.5 ml  Output   2242 ml  Net 1360.5 ml    Intake/Output this shift: UOP 425 since around [place]  Labs:  Recent Labs  08/17/14 0445  HGB 10.9*    Recent Labs  08/17/14 0445  WBC 7.9  RBC 3.68*  HCT 31.9*  PLT 214    Recent Labs  08/17/14 0445  NA 139  K 4.0  CL 108  CO2 25  BUN 23  CREATININE 0.75  GLUCOSE 157*  CALCIUM 8.4   No results for input(s): LABPT, INR in the last 72 hours.  EXAM General - Patient is Alert, Appropriate and Oriented Extremity - Neurovascular intact Sensation intact distally Dorsiflexion/Plantar flexion intact Dressing - dressing C/D/I Motor Function - intact, moving foot and toes well on exam.  Hemovac pulled without difficulty.  Past Medical History  Diagnosis Date  . Hyperlipidemia   . PTSD (post-traumatic stress disorder)   . No blood products   . Arthritis   . Rapid heart beat     takes metoprolol to treat / denies HBP  . Hypertension   . Osteopenia   . Tinnitus     Assessment/Plan: 1 Day Post-Op Procedure(s) (LRB): LEFT TOTAL KNEE ARTHROPLASTY (Left) Principal Problem:   OA (osteoarthritis) of  knee  Estimated body mass index is 26.11 kg/(m^2) as calculated from the following:   Height as of this encounter: 5\' 10"  (1.778 m).   Weight as of this encounter: 82.555 kg (182 lb). Advance diet Up with therapy Plan for discharge tomorrow Discharge home with home health  DVT Prophylaxis - Xarelto Weight-Bearing as tolerated to left leg D/C O2 and Pulse OX and try on Room Air  Avel Peacerew Tavoris Brisk, PA-C Orthopaedic Surgery 08/17/2014, 8:48 AM

## 2014-08-18 LAB — BASIC METABOLIC PANEL
Anion gap: 7 (ref 5–15)
BUN: 19 mg/dL (ref 6–23)
CALCIUM: 8.5 mg/dL (ref 8.4–10.5)
CO2: 25 mmol/L (ref 19–32)
CREATININE: 0.77 mg/dL (ref 0.50–1.10)
Chloride: 106 mmol/L (ref 96–112)
GFR calc non Af Amer: 85 mL/min — ABNORMAL LOW (ref >90)
Glucose, Bld: 110 mg/dL — ABNORMAL HIGH (ref 70–99)
Potassium: 4 mmol/L (ref 3.5–5.1)
Sodium: 138 mmol/L (ref 135–145)

## 2014-08-18 LAB — CBC
HCT: 29.2 % — ABNORMAL LOW (ref 36.0–46.0)
Hemoglobin: 10.1 g/dL — ABNORMAL LOW (ref 12.0–15.0)
MCH: 30 pg (ref 26.0–34.0)
MCHC: 34.6 g/dL (ref 30.0–36.0)
MCV: 86.6 fL (ref 78.0–100.0)
Platelets: 205 10*3/uL (ref 150–400)
RBC: 3.37 MIL/uL — AB (ref 3.87–5.11)
RDW: 13.1 % (ref 11.5–15.5)
WBC: 8.8 10*3/uL (ref 4.0–10.5)

## 2014-08-18 MED ORDER — TRAMADOL HCL 50 MG PO TABS: 50.0000 mg | ORAL_TABLET | Freq: Four times a day (QID) | ORAL | Status: DC | PRN

## 2014-08-18 MED ORDER — METHOCARBAMOL 500 MG PO TABS: 500.0000 mg | ORAL_TABLET | Freq: Four times a day (QID) | ORAL | Status: DC | PRN

## 2014-08-18 MED ORDER — RIVAROXABAN 10 MG PO TABS: 10.0000 mg | ORAL_TABLET | Freq: Every day | ORAL | Status: DC

## 2014-08-18 MED ORDER — OXYCODONE HCL 5 MG PO TABS: 5.0000 mg | ORAL_TABLET | ORAL | Status: DC | PRN

## 2014-08-18 MED ORDER — ONDANSETRON HCL 4 MG PO TABS: 4.0000 mg | ORAL_TABLET | Freq: Four times a day (QID) | ORAL | Status: DC | PRN

## 2014-08-18 NOTE — Discharge Summary (Signed)
Physician Discharge Summary   Patient ID: Kimberly Mccullough MRN: [id number] DOB/AGE: [birth year] 69 y.o.  Admit date: 08/16/2014 Discharge date: 08-19-2014  Primary Diagnosis:  Osteoarthritis Left knee(s) Admission Diagnoses:  Past Medical History  Diagnosis Date  . Hyperlipidemia   . PTSD (post-traumatic stress disorder)   . No blood products   . Arthritis   . Rapid heart beat     takes metoprolol to treat / denies HBP  . Hypertension   . Osteopenia   . Tinnitus    Discharge Diagnoses:   Principal Problem:   OA (osteoarthritis) of knee  Estimated body mass index is 26.11 kg/(m^2) as calculated from the following:   Height as of this encounter: 5' 10" (1.778 m).   Weight as of this encounter: 82.555 kg (182 lb).  Procedure:  Procedure(s) (LRB): LEFT TOTAL KNEE ARTHROPLASTY (Left)   Consults: None  HPI: Kimberly Mccullough is a 69 y.o. year old female with end stage OA of her left knee with progressively worsening pain and dysfunction. She has constant pain, with activity and at rest and significant functional deficits with difficulties even with ADLs. She has had extensive non-op management including analgesics, injections of cortisone, and home exercise program, but remains in significant pain with significant dysfunction. Radiographs show bone on bone arthritis lateral and patellofemoral with valgus deformity. She presents now for left Total Knee Arthroplasty.   Laboratory Data: Admission on 08/16/2014  Component Date Value Ref Range Status  . Transfuse no blood products 08/06/2014 TRANSFUSE NO BLOOD PRODUCTS, VERIFIED BY Darlene Reed,RN   Final  . WBC 08/17/2014 7.9  4.0 - 10.5 K/uL Final  . RBC 08/17/2014 3.68* 3.87 - 5.11 MIL/uL Final  . Hemoglobin 08/17/2014 10.9* 12.0 - 15.0 g/dL Final  . HCT 08/17/2014 31.9* 36.0 - 46.0 % Final  . MCV 08/17/2014 86.7  78.0 - 100.0 fL Final  . MCH 08/17/2014 29.6  26.0 - 34.0 pg Final  . MCHC 08/17/2014 34.2  30.0 - 36.0 g/dL  Final  . RDW 08/17/2014 12.9  11.5 - 15.5 % Final  . Platelets 08/17/2014 214  150 - 400 K/uL Final  . Sodium 08/17/2014 139  135 - 145 mmol/L Final  . Potassium 08/17/2014 4.0  3.5 - 5.1 mmol/L Final  . Chloride 08/17/2014 108  96 - 112 mmol/L Final  . CO2 08/17/2014 25  19 - 32 mmol/L Final  . Glucose, Bld 08/17/2014 157* 70 - 99 mg/dL Final  . BUN 08/17/2014 23  6 - 23 mg/dL Final  . Creatinine, Ser 08/17/2014 0.75  0.50 - 1.10 mg/dL Final  . Calcium 08/17/2014 8.4  8.4 - 10.5 mg/dL Final  . GFR calc non Af Amer 08/17/2014 86* >90 mL/min Final  . GFR calc Af Amer 08/17/2014 >90  >90 mL/min Final   Comment: (NOTE) The eGFR has been calculated using the CKD EPI equation. This calculation has not been validated in all clinical situations. eGFR's persistently <90 mL/min signify possible Chronic Kidney Disease.   . Anion gap 08/17/2014 6  5 - 15 Final  . WBC 08/18/2014 8.8  4.0 - 10.5 K/uL Final  . RBC 08/18/2014 3.37* 3.87 - 5.11 MIL/uL Final  . Hemoglobin 08/18/2014 10.1* 12.0 - 15.0 g/dL Final  . HCT 08/18/2014 29.2* 36.0 - 46.0 % Final  . MCV 08/18/2014 86.6  78.0 - 100.0 fL Final  . MCH 08/18/2014 30.0  26.0 - 34.0 pg Final  . MCHC 08/18/2014 34.6  30.0 - 36.0 g/dL Final  .  RDW 08/18/2014 13.1  11.5 - 15.5 % Final  . Platelets 08/18/2014 205  150 - 400 K/uL Final  . Sodium 08/18/2014 138  135 - 145 mmol/L Final  . Potassium 08/18/2014 4.0  3.5 - 5.1 mmol/L Final  . Chloride 08/18/2014 106  96 - 112 mmol/L Final  . CO2 08/18/2014 25  19 - 32 mmol/L Final  . Glucose, Bld 08/18/2014 110* 70 - 99 mg/dL Final  . BUN 08/18/2014 19  6 - 23 mg/dL Final  . Creatinine, Ser 08/18/2014 0.77  0.50 - 1.10 mg/dL Final  . Calcium 08/18/2014 8.5  8.4 - 10.5 mg/dL Final  . GFR calc non Af Amer 08/18/2014 85* >90 mL/min Final  . GFR calc Af Amer 08/18/2014 >90  >90 mL/min Final   Comment: (NOTE) The eGFR has been calculated using the CKD EPI equation. This calculation has not been validated  in all clinical situations. eGFR's persistently <90 mL/min signify possible Chronic Kidney Disease.   . Anion gap 08/18/2014 7  5 - 15 Final  . WBC 08/19/2014 7.6  4.0 - 10.5 K/uL Final  . RBC 08/19/2014 3.28* 3.87 - 5.11 MIL/uL Final  . Hemoglobin 08/19/2014 9.5* 12.0 - 15.0 g/dL Final  . HCT 08/19/2014 28.4* 36.0 - 46.0 % Final  . MCV 08/19/2014 86.6  78.0 - 100.0 fL Final  . MCH 08/19/2014 29.0  26.0 - 34.0 pg Final  . MCHC 08/19/2014 33.5  30.0 - 36.0 g/dL Final  . RDW 08/19/2014 13.2  11.5 - 15.5 % Final  . Platelets 08/19/2014 170  150 - 400 K/uL Final  Hospital Outpatient Visit on 08/05/2014  Component Date Value Ref Range Status  . aPTT 08/05/2014 31  24 - 37 seconds Final  . WBC 08/05/2014 4.1  4.0 - 10.5 K/uL Final  . RBC 08/05/2014 4.64  3.87 - 5.11 MIL/uL Final  . Hemoglobin 08/05/2014 13.7  12.0 - 15.0 g/dL Final  . HCT 08/05/2014 40.4  36.0 - 46.0 % Final  . MCV 08/05/2014 87.1  78.0 - 100.0 fL Final  . MCH 08/05/2014 29.5  26.0 - 34.0 pg Final  . MCHC 08/05/2014 33.9  30.0 - 36.0 g/dL Final  . RDW 08/05/2014 13.2  11.5 - 15.5 % Final  . Platelets 08/05/2014 206  150 - 400 K/uL Final  . Sodium 08/05/2014 140  135 - 145 mmol/L Final  . Potassium 08/05/2014 4.2  3.5 - 5.1 mmol/L Final  . Chloride 08/05/2014 108  96 - 112 mmol/L Final  . CO2 08/05/2014 26  19 - 32 mmol/L Final  . Glucose, Bld 08/05/2014 94  70 - 99 mg/dL Final  . BUN 08/05/2014 22  6 - 23 mg/dL Final  . Creatinine, Ser 08/05/2014 0.91  0.50 - 1.10 mg/dL Final  . Calcium 08/05/2014 9.3  8.4 - 10.5 mg/dL Final  . Total Protein 08/05/2014 6.8  6.0 - 8.3 g/dL Final  . Albumin 08/05/2014 4.1  3.5 - 5.2 g/dL Final  . AST 08/05/2014 24  0 - 37 U/L Final  . ALT 08/05/2014 16  0 - 35 U/L Final  . Alkaline Phosphatase 08/05/2014 59  39 - 117 U/L Final  . Total Bilirubin 08/05/2014 0.4  0.3 - 1.2 mg/dL Final  . GFR calc non Af Amer 08/05/2014 64* >90 mL/min Final  . GFR calc Af Amer 08/05/2014 74* >90 mL/min  Final   Comment: (NOTE) The eGFR has been calculated using the CKD EPI equation. This calculation has not been  validated in all clinical situations. eGFR's persistently <90 mL/min signify possible Chronic Kidney Disease.   . Anion gap 08/05/2014 6  5 - 15 Final  . Prothrombin Time 08/05/2014 12.9  11.6 - 15.2 seconds Final  . INR 08/05/2014 0.96  0.00 - 1.49 Final  . Color, Urine 08/05/2014 YELLOW  YELLOW Final  . APPearance 08/05/2014 CLOUDY* CLEAR Final  . Specific Gravity, Urine 08/05/2014 1.015  1.005 - 1.030 Final  . pH 08/05/2014 5.5  5.0 - 8.0 Final  . Glucose, UA 08/05/2014 NEGATIVE  NEGATIVE mg/dL Final  . Hgb urine dipstick 08/05/2014 NEGATIVE  NEGATIVE Final  . Bilirubin Urine 08/05/2014 NEGATIVE  NEGATIVE Final  . Ketones, ur 08/05/2014 NEGATIVE  NEGATIVE mg/dL Final  . Protein, ur 08/05/2014 NEGATIVE  NEGATIVE mg/dL Final  . Urobilinogen, UA 08/05/2014 0.2  0.0 - 1.0 mg/dL Final  . Nitrite 08/05/2014 POSITIVE* NEGATIVE Final  . Leukocytes, UA 08/05/2014 MODERATE* NEGATIVE Final  . MRSA, PCR 08/05/2014 NEGATIVE  NEGATIVE Final  . Staphylococcus aureus 08/05/2014 NEGATIVE  NEGATIVE Final   Comment:        The Xpert SA Assay (FDA approved for NASAL specimens in patients over 8 years of age), is one component of a comprehensive surveillance program.  Test performance has been validated by [facility] for patients greater than or equal to 48 year old. It is not intended to diagnose infection nor to guide or monitor treatment.   . Squamous Epithelial / LPF 08/05/2014 RARE  RARE Final  . WBC, UA 08/05/2014 7-10  <3 WBC/hpf Final  . Bacteria, UA 08/05/2014 MANY* RARE Final     X-Rays:No results found.  EKG: Orders placed or performed in visit on 05/14/14  . EKG 12-Lead     Hospital Course: BRITTAN BUTTERBAUGH is a 69 y.o. who was admitted to [facility]. They were brought to the operating room on 08/16/2014 and underwent Procedure(s): LEFT TOTAL KNEE  ARTHROPLASTY.  Patient tolerated the procedure well and was later transferred to the recovery room and then to the orthopaedic floor for postoperative care.  They were given PO and IV analgesics for pain control following their surgery.  They were given 24 hours of postoperative antibiotics of  Anti-infectives    Start     Dose/Rate Route Frequency Ordered Stop   08/16/14 2000  terbinafine (LAMISIL) tablet 250 mg     250 mg Oral Daily 08/16/14 1533     08/16/14 1830  ceFAZolin (ANCEF) IVPB 2 g/50 mL premix     2 g 100 mL/hr over 30 Minutes Intravenous Every 6 hours 08/16/14 1533 08/17/14 0139   08/16/14 1108  ceFAZolin (ANCEF) IVPB 2 g/50 mL premix     2 g 100 mL/hr over 30 Minutes Intravenous On call to O.R. 08/16/14 1108 08/16/14 1250     and started on DVT prophylaxis in the form of Xarelto.   PT and OT were ordered for total joint protocol.  Discharge planning consulted to help with postop disposition and equipment needs.  Patient had a decent night on the evening of surgery.  They started to get up OOB with therapy on day one. Hemovac drain was pulled without difficulty. Had a tough day with pain.  Continued to work with therapy into day two.  Dressing was changed on day two and the incision was healing well.  By day three, the patient had progressed with therapy and meeting their goals.  Incision was healing well.  Meds were changed to Tramadol  Patient was seen in rounds by Dr. Maureen Ralphs and was ready to go home.  Discharge home with home health Diet - Cardiac diet Follow up - in 2 weeks Activity - WBAT Disposition - Home Condition Upon Discharge - improved D/C Meds - See DC Summary DVT Prophylaxis - Xarelto      Discharge Instructions    Call MD / Call 911    Complete by:  As directed   If you experience chest pain or shortness of breath, CALL 911 and be transported to the hospital emergency room.  If you develope a fever above 101 F, pus (white drainage) or increased drainage or  redness at the wound, or calf pain, call your surgeon's office.     Change dressing    Complete by:  As directed   Change dressing daily with sterile 4 x 4 inch gauze dressing and apply TED hose. Do not submerge the incision under water.     Constipation Prevention    Complete by:  As directed   Drink plenty of fluids.  Prune juice may be helpful.  You may use a stool softener, such as Colace (over the counter) 100 mg twice a day.  Use MiraLax (over the counter) for constipation as needed.     Diet - low sodium heart healthy    Complete by:  As directed      Discharge instructions    Complete by:  As directed   Pick up stool softner and laxative for home use following surgery while on pain medications. Do not submerge incision under water. Please use good hand washing techniques while changing dressing each day. May shower starting three days after surgery. Please use a clean towel to pat the incision dry following showers. Continue to use ice for pain and swelling after surgery. Do not use any lotions or creams on the incision until instructed by your surgeon.  Take Xarelto for two and a half more weeks, then discontinue Xarelto. Once the patient has completed the blood thinner regimen, then take a Baby 81 mg Aspirin daily for three more weeks.  Postoperative Constipation Protocol  Constipation - defined medically as fewer than three stools per week and severe constipation as less than one stool per week.  One of the most common issues patients have following surgery is constipation.  Even if you have a regular bowel pattern at home, your normal regimen is likely to be disrupted due to multiple reasons following surgery.  Combination of anesthesia, postoperative narcotics, change in appetite and fluid intake all can affect your bowels.  In order to avoid complications following surgery, here are some recommendations in order to help you during your recovery period.  Colace (docusate) -  Pick up an over-the-counter form of Colace or another stool softener and take twice a day as long as you are requiring postoperative pain medications.  Take with a full glass of water daily.  If you experience loose stools or diarrhea, hold the colace until you stool forms back up.  If your symptoms do not get better within 1 week or if they get worse, check with your doctor.  Dulcolax (bisacodyl) - Pick up over-the-counter and take as directed by the product packaging as needed to assist with the movement of your bowels.  Take with a full glass of water.  Use this product as needed if not relieved by Colace only.   MiraLax (polyethylene glycol) - Pick up over-the-counter to have on hand.  MiraLax is a  solution that will increase the amount of water in your bowels to assist with bowel movements.  Take as directed and can mix with a glass of water, juice, soda, coffee, or tea.  Take if you go more than two days without a movement. Do not use MiraLax more than once per day. Call your doctor if you are still constipated or irregular after using this medication for 7 days in a row.  If you continue to have problems with postoperative constipation, please contact the office for further assistance and recommendations.  If you experience "the worst abdominal pain ever" or develop nausea or vomiting, please contact the office immediatly for further recommendations for treatment.     Do not put a pillow under the knee. Place it under the heel.    Complete by:  As directed      Do not sit on low chairs, stoools or toilet seats, as it may be difficult to get up from low surfaces    Complete by:  As directed      Driving restrictions    Complete by:  As directed   No driving until released by the physician.     Increase activity slowly as tolerated    Complete by:  As directed      Lifting restrictions    Complete by:  As directed   No lifting until released by the physician.     Patient may shower     Complete by:  As directed   You may shower without a dressing once there is no drainage.  Do not wash over the wound.  If drainage remains, do not shower until drainage stops.     TED hose    Complete by:  As directed   Use stockings (TED hose) for 3 weeks on both leg(s).  You may remove them at night for sleeping.     Weight bearing as tolerated    Complete by:  As directed   Laterality:  left  Extremity:  Lower            Medication List    STOP taking these medications        calcium-vitamin D 500-200 MG-UNIT per tablet  Commonly known as:  OSCAL WITH D     GLUCOSAMINE-MSM PO     meloxicam 15 MG tablet  Commonly known as:  MOBIC     Omega-3-6-9 Caps     vitamin E 400 UNIT capsule      TAKE these medications        methocarbamol 500 MG tablet  Commonly known as:  ROBAXIN  Take 1 tablet (500 mg total) by mouth every 6 (six) hours as needed for muscle spasms.     metoprolol tartrate 25 MG tablet  Commonly known as:  LOPRESSOR  Take 1 tablet (25 mg total) by mouth 2 (two) times daily.     ondansetron 4 MG tablet  Commonly known as:  ZOFRAN  Take 1 tablet (4 mg total) by mouth every 6 (six) hours as needed for nausea.     rivaroxaban 10 MG Tabs tablet  Commonly known as:  XARELTO  - Take 1 tablet (10 mg total) by mouth daily with breakfast. Take Xarelto for two and a half more weeks, then discontinue Xarelto.  - Once the patient has completed the blood thinner regimen, then take a Baby 81 mg Aspirin daily for three more weeks.     terbinafine 250 MG tablet  Commonly known as:  LAMISIL  Take 250 mg by mouth daily.     traMADol 50 MG tablet  Commonly known as:  ULTRAM  Take 1-2 tablets (50-100 mg total) by mouth every 6 (six) hours as needed (mild to moderate pain).       Follow-up Information    Follow up with Gearlean Alf, MD. Schedule an appointment as soon as possible for a visit on 08/31/2014.   Specialty:  Orthopedic Surgery   Why:  Call office at  [phone] to setup appointment on Tuesday 08/31/2014 with Dr. Wynelle Link.   Contact information:   [address] [place] [address] [phone]       Signed: Arlee Muslim, PA-C Orthopaedic Surgery 08/19/2014, 7:58 AM

## 2014-08-18 NOTE — Progress Notes (Signed)
Physical Therapy Treatment Patient Details Name: Kimberly Mccullough Vice MRN: [id number] DOB: 1946-01-20 Today's Date: 08/18/2014    History of Present Illness L TKA    PT Comments    POD # 2 PM Session; pt was OOB in chair with son present Barbara Cower(Jason); pt no Mccullough/o pain while sitting; L knee immobilizer put on; sit to stand to walker pt required cues for L LE (keep straight out) and R LE (slideback more) scoot forward in chair; cues for hand placement; Ambulate 50 ft in unit hall; pt stated " my neck is hurting" when asked about pain; Pt required VC and physical cues while ambulating with walker placement and LE placement in walker; Return back to room and into bed for CPM;Education provided hands on for immobilizer brace to pt and son; ICE applied.   Follow Up Recommendations  Home health PT;Supervision/Assistance - 24 hour     Equipment Recommendations  None recommended by PT    Recommendations for Other Services       Precautions / Restrictions Precautions Precautions: Knee;Fall Required Braces or Orthoses: Knee Immobilizer - Left Knee Immobilizer - Left: Discontinue once straight leg raise with < 10 degree lag Restrictions Weight Bearing Restrictions: No    Mobility  Bed Mobility Overal bed mobility: Needs Assistance Bed Mobility: Sit to Supine       Sit to supine: HOB elevated;Min assist   General bed mobility comments: EOB to Supine; HOB elevated; assited with L LE extremity   Transfers Overall transfer level: Needs assistance Equipment used: Rolling walker (2 wheeled) Transfers: Sit to/from Stand Sit to Stand: Min assist;Mod assist         General transfer comment: Cues need for UE/LE placment; cues required for repoistioning with walker and feet for balance   Ambulation/Gait Ambulation/Gait assistance: Mod assist Ambulation Distance (Feet): 50 Feet Assistive device: Rolling walker (2 wheeled) Gait Pattern/deviations: Step-to pattern;Decreased stride length Gait  velocity: decreased    General Gait Details: Cues for sequence required; weight increase on L LE; physical cues for placement of feet when stepping in walker;    Stairs            Wheelchair Mobility    Modified Rankin (Stroke Patients Only)       Balance                                    Cognition Arousal/Alertness: Awake/alert Behavior During Therapy: WFL for tasks assessed/performed Overall Cognitive Status: Within Functional Limits for tasks assessed                 General Comments: Pt stated felt a little dizzy    Exercises    General Comments        Pertinent Vitals/Pain Pain Assessment: 0-10 Pain Score: 4  (during activity ) Pain Location: L Knee Pain Intervention(s): Limited activity within patient's tolerance;Monitored during session;Ice applied;Repositioned    Home Living                      Prior Function            PT Goals (current goals can now be found in the care plan section) Progress towards PT goals: Progressing toward goals    Frequency  7X/week    PT Plan      Co-evaluation             End of Session Equipment  Utilized During Treatment: Left knee immobilizer;Gait belt Activity Tolerance: Patient tolerated treatment well;Patient limited by pain Patient left: in bed;with family/visitor present;with call bell/phone within reach     Time: 1440-1505 PT Time Calculation (min) (ACUTE ONLY): 25 min  Charges:  $Gait Training: 8-22 mins $Therapeutic Activity: 8-22 mins                    G Codes:      Duric,Sreto Student PTA  08/18/2014, 3:40 PM   Reviewed and agree with above. Felecia Shelling  PTA [facility]  Acute  Rehab Pager      [phone]

## 2014-08-18 NOTE — Evaluation (Signed)
Occupational Therapy Evaluation Patient Details Name: Kimberly Mccullough MRN: [id number] DOB: January 24, 1946 Today's Date: 08/18/2014    History of Present Illness L TKA   Clinical Impression   This 69 year old female was admitted for the above surgery.  She will benefit from skilled OT in acute at follow up at home to increase safety and independence with adls.  Pt currently needs mod to max A for LB adls and mod A for toilet transfers.  She was independent prior to admission.  Goals are for min guard to min A in acute    Follow Up Recommendations  Home health OT    Equipment Recommendations  None recommended by OT    Recommendations for Other Services       Precautions / Restrictions Precautions Precautions: Knee;Fall Required Braces or Orthoses: Knee Immobilizer - Left Knee Immobilizer - Left: Discontinue once straight leg raise with < 10 degree lag Restrictions Weight Bearing Restrictions: No      Mobility Bed Mobility           Sit to supine: Min assist   General bed mobility comments: support LLE  Transfers   Equipment used: Rolling walker (2 wheeled) Transfers: Sit to/from [employer] Sit to Stand: Mod assist Stand pivot transfers: Mod assist       General transfer comment: cues for UE/LE placement.  Transitions are slow    Balance                                            ADL Overall ADL's : Needs assistance/impaired     Grooming: Set up;Sitting   Upper Body Bathing: Set up;Sitting   Lower Body Bathing: Moderate assistance;Sit to/from stand   Upper Body Dressing : Set up;Sitting   Lower Body Dressing: Maximum assistance;Sit to/from stand   Toilet Transfer: Moderate assistance;Stand-pivot;BSC;RW   Toileting- Clothing Manipulation and Hygiene: Moderate assistance;Sit to/from stand         General ADL Comments: pt performed SPT to [facility] then walked 2 feet to chair.  Pt has slow transitions for sit to  stand; mod A to maintain balance while she shifts forward.  Cues to stand tall as she tends to flex trunk..  Pt has a reacher:  explained/demonstrated ADL uses; will try on next visist     Vision     Perception     Praxis      Pertinent Vitals/Pain Pain Score: 7  Pain Location: L knee Pain Descriptors / Indicators: Aching Pain Intervention(s): Limited activity within patient's tolerance;Monitored during session;Premedicated before session;Repositioned     Hand Dominance     Extremity/Trunk Assessment Upper Extremity Assessment Upper Extremity Assessment: Overall WFL for tasks assessed           Communication Communication Communication: No difficulties   Cognition Arousal/Alertness: Awake/alert Behavior During Therapy: WFL for tasks assessed/performed Overall Cognitive Status: Impaired/Different from baseline Area of Impairment: Memory               General Comments: Pt asked repeatedly about bending knee-may be due to medication.  Needs reinforcement   General Comments       Exercises       Shoulder Instructions      Home Living Family/patient expects to be discharged to:: Private residence Living Arrangements: Children Available Help at Discharge: Family  Bathroom Shower/Tub: Producer, television/film/videoWalk-in shower   Bathroom Toilet: Standard     Home Equipment: Environmental consultantWalker - 2 wheels;Cane - single point;Bedside commode   Additional Comments: son is living with her right now      Prior Functioning/Environment Level of Independence: Independent             OT Diagnosis: Acute pain;Generalized weakness   OT Problem List: Decreased strength;Decreased activity tolerance;Decreased knowledge of use of DME or AE;Pain   OT Treatment/Interventions: Self-care/ADL training;DME and/or AE instruction;Patient/family education    OT Goals(Current goals can be found in the care plan section) Acute Rehab OT Goals Patient Stated Goal: to go home OT Goal  Formulation: With patient Time For Goal Achievement: 08/25/14 Potential to Achieve Goals: Good ADL Goals Pt Will Perform Lower Body Bathing: with adaptive equipment;sit to/from stand;with min guard assist Pt Will Perform Lower Body Dressing: with min assist;with adaptive equipment;sit to/from stand Pt Will Transfer to Toilet: with min assist;ambulating;bedside commode Pt Will Perform Toileting - Clothing Manipulation and hygiene: sit to/from stand;with min guard assist Additional ADL Goal #1: Pt will verbalize shower sequence  OT Frequency: Min 2X/week   Barriers to D/C:            Co-evaluation              End of Session CPM Left Knee CPM Left Knee: Off  Activity Tolerance: No increased pain Patient left: in chair;with call bell/phone within reach   Time: 0830-0858 OT Time Calculation (min): 28 min Charges:  OT General Charges $OT Visit: 1 Procedure OT Evaluation $Initial OT Evaluation Tier I: 1 Procedure OT Treatments $Self Care/Home Management : 8-22 mins G-Codes:    Kimberly Mccullough 08/18/2014, 10:17 AM  Kimberly Mccullough, OTR/L [phone] 08/18/2014

## 2014-08-18 NOTE — Progress Notes (Signed)
   Subjective: 2 Days Post-Op Procedure(s) (LRB): LEFT TOTAL KNEE ARTHROPLASTY (Left) Patient reports pain as mild and moderate.   Patient seen in rounds for Dr. Wynelle Link.  Had a difficult day yesterday with pain management.  Feeling better this morning.  Will need mor therapy today.  IF she does well with two sessions, then home later today after the second.  If not, then maybe tomorrow.  Will sse how she does with pain control and mobility today. Patient is well, but has had some minor complaints of pain in the knee, requiring pain medications Patient is progressing and will see if she is ready to go home later today.  Objective: Vital signs in last 24 hours: Temp:  [98.8 F (37.1 C)-101.3 F (38.5 C)] 99.2 F (37.3 C) (04/20 0924) Pulse Rate:  [80-101] 87 (04/20 0924) Resp:  [15-16] 16 (04/20 0509) BP: (96-140)/(58-80) 96/63 mmHg (04/20 0924) SpO2:  [94 %-98 %] 94 % (04/20 0509)  Intake/Output from previous day:  Intake/Output Summary (Last 24 hours) at 08/18/14 1012 Last data filed at 08/18/14 0015  Gross per 24 hour  Intake    840 ml  Output   1550 ml  Net   -710 ml    Labs:  Recent Labs  08/17/14 0445 08/18/14 0505  HGB 10.9* 10.1*    Recent Labs  08/17/14 0445 08/18/14 0505  WBC 7.9 8.8  RBC 3.68* 3.37*  HCT 31.9* 29.2*  PLT 214 205    Recent Labs  08/17/14 0445 08/18/14 0505  NA 139 138  K 4.0 4.0  CL 108 106  CO2 25 25  BUN 23 19  CREATININE 0.75 0.77  GLUCOSE 157* 110*  CALCIUM 8.4 8.5   No results for input(s): LABPT, INR in the last 72 hours.  EXAM: General - Patient is Alert, Appropriate and Oriented Extremity - Neurovascular intact Sensation intact distally Dorsiflexion/Plantar flexion intact No cellulitis present Incision - clean, dry, no drainage, healing Motor Function - intact, moving foot and toes well on exam.   Assessment/Plan: 2 Days Post-Op Procedure(s) (LRB): LEFT TOTAL KNEE ARTHROPLASTY (Left) Procedure(s) (LRB): LEFT  TOTAL KNEE ARTHROPLASTY (Left) Past Medical History  Diagnosis Date  . Hyperlipidemia   . PTSD (post-traumatic stress disorder)   . No blood products   . Arthritis   . Rapid heart beat     takes metoprolol to treat / denies HBP  . Hypertension   . Osteopenia   . Tinnitus    Principal Problem:   OA (osteoarthritis) of knee  Estimated body mass index is 26.11 kg/(m^2) as calculated from the following:   Height as of this encounter: _0  (1.778 m).   Weight as of this encounter: 82.555 kg (182 lb). Up with therapy Discharge home with home health if met goals with therapy Diet - Cardiac diet Follow up - in 2 weeks Activity - WBAT Disposition - Home Condition Upon Discharge - Pending D/C Meds - See DC Summary DVT Prophylaxis - Xarelto  Arlee Muslim, PA-C Orthopaedic Surgery 08/18/2014, 10:12 AM

## 2014-08-18 NOTE — Progress Notes (Signed)
Physical Therapy Treatment Patient Details Name: Kimberly Mccullough MRN: [id number] DOB: 12-07-1945 Today's Date: 08/18/2014    History of Present Illness L TKA    PT Comments    POD # 2 AM session; Pt was OOB in chair; pt requested to have personal robe put on assisted with; L KNEE immobilizer put on;  sit to stand from chair to walker to ambulate; Pt required cues for correct foot placement and readjust position in walker for balance; ambulated 50 ft in unit hall; pt required VC and physical cues for foot placement in walker while ambulating; pt stated she felt "loopy" and felt some pain in neck; monitored during session; returned to room and into chair;TE completed with patient; ice applied; prior to ice noted minor drainage (blood) applied dressing and tape for reinforcement;  Follow Up Recommendations  Home health PT;Supervision/Assistance - 24 hour     Equipment Recommendations  None recommended by PT    Recommendations for Other Services       Precautions / Restrictions Precautions Precautions: Knee;Fall Required Braces or Orthoses: Knee Immobilizer - Left Knee Immobilizer - Left: Discontinue once straight leg raise with < 10 degree lag Restrictions Weight Bearing Restrictions: No    Mobility  Bed Mobility               General bed mobility comments: Pt OOB in Chair already  Transfers Overall transfer level: Needs assistance Equipment used: Rolling walker (2 wheeled) Transfers: Sit to/from Stand Sit to Stand: Min assist;Mod assist         General transfer comment: Cues need for UE/LE placment; cues required for repoistioning with walker and feet for balance   Ambulation/Gait Ambulation/Gait assistance: Mod assist Ambulation Distance (Feet): 50 Feet Assistive device: Rolling walker (2 wheeled) Gait Pattern/deviations: Step-to pattern;Decreased stride length Gait velocity: decreased    General Gait Details: Cues for sequence required; weight increase  on L LE; physical cues for placement of feet when stepping in walker;    Stairs            Wheelchair Mobility    Modified Rankin (Stroke Patients Only)       Balance                                    Cognition Arousal/Alertness: Awake/alert Behavior During Therapy: WFL for tasks assessed/performed                   General Comments: Pt stated felt not dizziness but "loopy"    Exercises Total Joint Exercises Ankle Circles/Pumps: AROM;Both;Seated;20 reps Quad Sets: AROM;Left;Seated;10 reps Towel Squeeze: AROM;Left;10 reps;Seated Heel Slides: AAROM;Seated;Left;10 reps Hip ABduction/ADduction: AAROM;Seated;Left;10 reps Straight Leg Raises: AAROM;Seated;Left;10 reps Goniometric ROM: Measured 45 degrees     General Comments        Pertinent Vitals/Pain Pain Assessment: 0-10 Pain Score: 7  (7 /10 during acitivity walking and exercises 5/10 at rest ) Pain Location: L Knee Pain Intervention(s): Limited activity within patient's tolerance;Monitored during session;Ice applied;Repositioned    Home Living                      Prior Function            PT Goals (current goals can now be found in the care plan section) Progress towards PT goals: Progressing toward goals    Frequency  7X/week    PT Plan  Co-evaluation             End of Session Equipment Utilized During Treatment: Left knee immobilizer;Gait belt Activity Tolerance: Patient tolerated treatment well;Patient limited by pain Patient left: in chair;with call bell/phone within reach     Time:   1027-1106    Charges:    1 gt   1 ta   1 te                   G Codes:      Duric,Sreto Student PTA  08/18/2014, 2:27 PM   Reviewed and agree with above  Felecia Shelling  PTA [facility]  Acute  Rehab Pager      [phone]

## 2014-08-19 LAB — CBC
HCT: 28.4 % — ABNORMAL LOW (ref 36.0–46.0)
Hemoglobin: 9.5 g/dL — ABNORMAL LOW (ref 12.0–15.0)
MCH: 29 pg (ref 26.0–34.0)
MCHC: 33.5 g/dL (ref 30.0–36.0)
MCV: 86.6 fL (ref 78.0–100.0)
PLATELETS: 170 10*3/uL (ref 150–400)
RBC: 3.28 MIL/uL — AB (ref 3.87–5.11)
RDW: 13.2 % (ref 11.5–15.5)
WBC: 7.6 10*3/uL (ref 4.0–10.5)

## 2014-08-19 MED ORDER — TRAMADOL HCL 50 MG PO TABS: 50.0000 mg | ORAL_TABLET | Freq: Four times a day (QID) | ORAL | Status: DC | PRN

## 2014-08-19 MED ORDER — METHOCARBAMOL 500 MG PO TABS: 500.0000 mg | ORAL_TABLET | Freq: Four times a day (QID) | ORAL | Status: DC | PRN

## 2014-08-19 MED ORDER — RIVAROXABAN 10 MG PO TABS: 10.0000 mg | ORAL_TABLET | Freq: Every day | ORAL | Status: DC

## 2014-08-19 MED ORDER — ONDANSETRON HCL 4 MG PO TABS: 4.0000 mg | ORAL_TABLET | Freq: Four times a day (QID) | ORAL | Status: DC | PRN

## 2014-08-19 NOTE — Progress Notes (Signed)
RN reviewed discharge education with patient. All questions answered.   Paperwork and prescriptions given.   NT rolled patient down in wheelchair to family car.

## 2014-08-19 NOTE — Progress Notes (Signed)
Occupational Therapy Treatment Patient Details Name: Kimberly Mccullough MRN: [id number] DOB: Jun 05, 1945 Today's Date: 08/19/2014    History of present illness L TKA   OT comments  Pt is making good progress in OT.  Recommend continued HHOT to reach at mod I level at home  Follow Up Recommendations  Home health OT    Equipment Recommendations  None recommended by OT    Recommendations for Other Services      Precautions / Restrictions Precautions Precautions: Knee;Fall Required Braces or Orthoses: Knee Immobilizer - Left Knee Immobilizer - Left: Discontinue once straight leg raise with < 10 degree lag Restrictions Weight Bearing Restrictions: No       Mobility Bed Mobility              Transfers Overall transfer level: Needs assistance Equipment used: Rolling walker (2 wheeled) Transfers: Sit to/from Stand Sit to Stand: Min assist         General transfer comment: Cues need for UE placement    Balance                                   ADL Overall ADL's : Needs assistance/impaired             Lower Body Bathing: Minimal assistance;Sit to/from stand       Lower Body Dressing: Minimal assistance;Sit to/from stand   Toilet Transfer: Stand-pivot;Moderate assistance (to recliner)             General ADL Comments: Pt had recently used commode and did not want to practice shower transfer.  Reviewed sequence with her and she verbalizes understanding.  She has all AE at home:  Needs assistance due to pain and swelling (for shoe). She may want to wear non-skid socks at home.  Reviewed bed mobility techniques so that she can manage LLE--sore at this time.  Pt's main concern is being independent wtih adls.  She is not sure how hands-on her son will be.  Safety cues given when pulling pants over hips.      Vision                     Perception     Praxis      Cognition   Behavior During Therapy: WFL for tasks  assessed/performed;Flat affect Overall Cognitive Status: Within Functional Limits for tasks assessed                  General Comments: no dizziness reported today    Extremity/Trunk Assessment               Exercises    Shoulder Instructions       General Comments      Pertinent Vitals/ Pain       Pain Assessment: 0-10 Pain Score: 5  (to 8 with ADL) Pain Location: L knee Pain Descriptors / Indicators: Sore Pain Intervention(s): Limited activity within patient's tolerance;Monitored during session;Premedicated before session;Repositioned;Ice applied  Home Living                                          Prior Functioning/Environment              Frequency Min 2X/week     Progress Toward Goals  OT Goals(current goals can now be found in the  care plan section)  Progress towards OT goals: Progressing toward goals  Acute Rehab OT Goals Patient Stated Goal: to go home  Plan      Co-evaluation                 End of Session     Activity Tolerance Patient tolerated treatment well   Patient Left in chair;with call bell/phone within reach   Nurse Communication          Time: 2841-3244 OT Time Calculation (min): 52 min  Charges: OT General Charges $OT Visit: 1 Procedure OT Treatments $Self Care/Home Management : 38-52 mins  Alailah Safley 08/19/2014, 12:27 PM   Marica Otter, OTR/L [phone] 08/19/2014

## 2014-08-19 NOTE — Progress Notes (Signed)
Physical Therapy Treatment Patient Details Name: Kimberly Mccullough MRN: [id number] DOB: 1945/10/17 Today's Date: 08/19/2014    History of Present Illness L TKA    PT Comments    Amb and therex completed, pt tol well; continues to require cues and assist with bed mobility and transfers; pt has much difficulty with knee flexion and discussed importance of working on this at home; pt verbalizes understanding  Follow Up Recommendations  Home health PT;Supervision/Assistance - 24 hour     Equipment Recommendations  None recommended by PT    Recommendations for Other Services       Precautions / Restrictions Precautions Precautions: Knee;Fall Required Braces or Orthoses: Knee Immobilizer - Left Knee Immobilizer - Left: Discontinue once straight leg raise with < 10 degree lag Restrictions Weight Bearing Restrictions: No    Mobility  Bed Mobility Overal bed mobility: Needs Assistance Bed Mobility: Sit to Supine       Sit to supine: Min assist   General bed mobility comments: min with LLE and cues for technique  Transfers Overall transfer level: Needs assistance Equipment used: Rolling walker (2 wheeled) Transfers: Sit to/from Stand Sit to Stand: Min assist         General transfer comment: Cues need for UE/LE placment; cues required for repositioning of RLE in incr knee flexion  Ambulation/Gait Ambulation/Gait assistance: Min guard Ambulation Distance (Feet): 80 Feet Assistive device: Rolling walker (2 wheeled) Gait Pattern/deviations: Step-to pattern Gait velocity: decreased  Gait velocity interpretation: Below normal speed for age/gender General Gait Details: occasional cues for RW position, especially with turns; very slow gait    Stairs            Wheelchair Mobility    Modified Rankin (Stroke Patients Only)       Balance                                    Cognition Arousal/Alertness: Awake/alert Behavior During Therapy:  WFL for tasks assessed/performed;Flat affect Overall Cognitive Status: Within Functional Limits for tasks assessed                 General Comments: no dizziness reported today    Exercises Total Joint Exercises Ankle Circles/Pumps: AROM;Both;Seated;20 reps Quad Sets: AROM;Left;Seated;10 reps Heel Slides: AAROM;Seated;Left;10 reps Hip ABduction/ADduction: AAROM;Seated;Left;10 reps Straight Leg Raises: AAROM;Seated;Left;10 reps Goniometric ROM: ~ 10 to 40* AAROM L knee; pt having difficulty with flexion    General Comments        Pertinent Vitals/Pain Pain Assessment: 0-10 Pain Score: 5  Pain Location: L knee Pain Descriptors / Indicators: Sore Pain Intervention(s): Limited activity within patient's tolerance;Monitored during session;Premedicated before session;Repositioned;Ice applied    Home Living                      Prior Function            PT Goals (current goals can now be found in the care plan section) Acute Rehab PT Goals Patient Stated Goal: to go home Time For Goal Achievement: 08/21/14 Potential to Achieve Goals: Good Progress towards PT goals: Progressing toward goals    Frequency  7X/week    PT Plan Current plan remains appropriate    Co-evaluation             End of Session Equipment Utilized During Treatment: Left knee immobilizer;Gait belt Activity Tolerance: Patient tolerated treatment well;Patient limited by pain Patient left:  in bed;with call bell/phone within reach     Time: 0858-0929 PT Time Calculation (min) (ACUTE ONLY): 31 min  Charges:  $Gait Training: 8-22 mins $Therapeutic Exercise: 8-22 mins                    G Codes:      Quantisha Marsicano 2014/08/24, 9:59 AM

## 2014-08-19 NOTE — Progress Notes (Signed)
   Subjective: 3 Days Post-Op Procedure(s) (LRB): LEFT TOTAL KNEE ARTHROPLASTY (Left) Patient reports pain as mild.   Patient seen in rounds by Dr. Lequita HaltAluisio. Patient is well, but has had some minor complaints of pain in the knee, requiring pain medications Patient is ready to go home today.  Meds changed to Tramadol.  Objective: Vital signs in last 24 hours: Temp:  [98.5 F (36.9 C)-99.2 F (37.3 C)] 98.8 F (37.1 C) (04/20 2045) Pulse Rate:  [87-99] 99 (04/20 2045) Resp:  [16] 16 (04/20 2045) BP: (96-110)/(63-65) 110/65 mmHg (04/20 2045) SpO2:  [94 %-95 %] 95 % (04/20 2045)  Intake/Output from previous day:  Intake/Output Summary (Last 24 hours) at 08/19/14 0710 Last data filed at 08/18/14 1933  Gross per 24 hour  Intake    720 ml  Output    300 ml  Net    420 ml    Labs:  Recent Labs  08/17/14 0445 08/18/14 0505 08/19/14 0405  HGB 10.9* 10.1* 9.5*    Recent Labs  08/18/14 0505 08/19/14 0405  WBC 8.8 7.6  RBC 3.37* 3.28*  HCT 29.2* 28.4*  PLT 205 170    Recent Labs  08/17/14 0445 08/18/14 0505  NA 139 138  K 4.0 4.0  CL 108 106  CO2 25 25  BUN 23 19  CREATININE 0.75 0.77  GLUCOSE 157* 110*  CALCIUM 8.4 8.5   No results for input(s): LABPT, INR in the last 72 hours.  EXAM: General - Patient is Alert, Appropriate and Oriented Extremity - Neurovascular intact Sensation intact distally Dorsiflexion/Plantar flexion intact Incision - clean, dry, no drainage Motor Function - intact, moving foot and toes well on exam.   Assessment/Plan: 3 Days Post-Op Procedure(s) (LRB): LEFT TOTAL KNEE ARTHROPLASTY (Left) Procedure(s) (LRB): LEFT TOTAL KNEE ARTHROPLASTY (Left) Past Medical History  Diagnosis Date  . Hyperlipidemia   . PTSD (post-traumatic stress disorder)   . No blood products   . Arthritis   . Rapid heart beat     takes metoprolol to treat / denies HBP  . Hypertension   . Osteopenia   . Tinnitus    Principal Problem:   OA  (osteoarthritis) of knee  Estimated body mass index is 26.11 kg/(m^2) as calculated from the following:   Height as of this encounter: 5\' 10"  (1.778 m).   Weight as of this encounter: 82.555 kg (182 lb). Up with therapy Discharge home with home health Diet - Cardiac diet Follow up - in 2 weeks Activity - WBAT Disposition - Home Condition Upon Discharge - improved D/C Meds - See DC Summary DVT Prophylaxis - Xarelto  Kimberly Peacerew Jiovany Scheffel, PA-C Orthopaedic Surgery 08/19/2014, 7:10 AM

## 2014-08-19 NOTE — Progress Notes (Signed)
[name] called the hospital at 1450, and informed RN that she fell backwards while trying to get in to her house. She states that her knee is not bleeding, or swollen and that her knee did not over-flex when she fell. RN asked patient to call the office and let Dr. Lequita HaltAluisio know, and to call the office in the next 48 hours if there were any changes in her pain, mobility, or if any swelling, or drainage develops from her knee. Patient stated she understood these instructions and would call Dr. Deri FuellingAluisio's office if she had anymore concerns.

## 2014-09-07 ENCOUNTER — Ambulatory Visit: Payer: Medicare Other | Attending: Orthopedic Surgery | Admitting: Physical Therapy

## 2014-09-07 DIAGNOSIS — M7989 Other specified soft tissue disorders: Secondary | ICD-10-CM

## 2014-09-07 DIAGNOSIS — R269 Unspecified abnormalities of gait and mobility: Secondary | ICD-10-CM | POA: Diagnosis not present

## 2014-09-07 DIAGNOSIS — M6281 Muscle weakness (generalized): Secondary | ICD-10-CM

## 2014-09-07 DIAGNOSIS — M25662 Stiffness of left knee, not elsewhere classified: Secondary | ICD-10-CM

## 2014-09-07 DIAGNOSIS — M25562 Pain in left knee: Secondary | ICD-10-CM | POA: Diagnosis not present

## 2014-09-07 NOTE — Therapy (Signed)
[facility] [address] [place], [place], [address] Phone: [phone]   Fax:  [phone]  Physical Therapy Evaluation  Patient Details  Name: Kimberly Mccullough MRN: [id number] Date of Birth: April 12, 1946 Referring Provider:  Ollen GrossAluisio, Frank, MD  Encounter Date: 09/07/2014      PT End of Session - 09/07/14 1034    Visit Number 1   Number of Visits 12   Date for PT Re-Evaluation 10/05/14   PT Start Time 1034   PT Stop Time 1133   PT Time Calculation (min) 59 min   Activity Tolerance Patient tolerated treatment well;Patient limited by pain   Behavior During Therapy [facility] for tasks assessed/performed      Past Medical History  Diagnosis Date  . Hyperlipidemia   . PTSD (post-traumatic stress disorder)   . No blood products   . Arthritis   . Rapid heart beat     takes metoprolol to treat / denies HBP  . Hypertension   . Osteopenia   . Tinnitus     Past Surgical History  Procedure Laterality Date  . Abdominal hysterectomy    . Cholecystectomy    . Bunionectomy      BILATERAL  . Total hip arthroplasty Left 11/11/2013    Procedure: LEFT TOTAL HIP ARTHROPLASTY ANTERIOR APPROACH;  Surgeon: Loanne DrillingFrank Aluisio V, MD;  Location: [facility];  Service: Orthopedics;  Laterality: Left;  . Total knee arthroplasty Left 08/16/2014    Procedure: LEFT TOTAL KNEE ARTHROPLASTY;  Surgeon: Ollen GrossFrank Aluisio, MD;  Location: [facility];  Service: Orthopedics;  Laterality: Left;    There were no vitals filed for this visit.  Visit Diagnosis:  Stiffness of knee joint, left - Plan: PT plan of care cert/re-cert  Pain in left knee - Plan: PT plan of care cert/re-cert  Swelling of limb - Plan: PT plan of care cert/re-cert  Abnormality of gait - Plan: PT plan of care cert/re-cert  Generalized muscle weakness - Plan: PT plan of care cert/re-cert      Subjective Assessment - 09/07/14 1038    Subjective Patient had a left TKR on 08/16/14. She is  presently ambulating with a SPC in the community and no cane at home. Patient had HHPT until 09/01/14. Patient is having difficulty with sitting and is lacking flexion.  MD wants her at 120 degrees.     Limitations Sitting   How long can you sit comfortably? 20 min   Patient Stated Goals walk with a normal gait pattern and without AD and that she can bend her knee.   Currently in Pain? Yes   Pain Score 5    Pain Location Knee   Pain Orientation Left  posterior knee today   Pain Descriptors / Indicators Burning   Pain Type Surgical pain   Pain Onset 1 to 4 weeks ago   Pain Frequency Intermittent   Aggravating Factors  sitting   Pain Relieving Factors standing up   Effect of Pain on Daily Activities limited with ADLs            [facility] - 09/07/14 0001    Assessment   Medical Diagnosis Lt TKR    Onset Date 08/16/14   Precautions   Precautions None   Balance Screen   Has the patient fallen in the past 6 months Yes  patient fell backward when came home from hospital   How many times? 1   Has the patient had a decrease  in activity level because of a fear of falling?  Yes   Is the patient reluctant to leave their home because of a fear of falling?  No   Home Environment   Living Enviornment Private residence   Living Arrangements Alone  son helping out   Prior Function   Level of Independence Independent with basic ADLs   Vocation Retired   Observation/Other Assessments   Skin Integrity eschar on lateral knee and post lat thigh   Focus on Therapeutic Outcomes (FOTO)  58% limited (goal 44%)   Sensation   Light Touch --  decreased on lateral left knee   ROM / Strength   AROM / PROM / Strength AROM;PROM;Strength   AROM   Overall AROM Comments Lt hip WFL   AROM Assessment Site Knee   Right/Left Knee Left   Left Knee Extension -3   Left Knee Flexion 58  supine; 61 sitting   PROM   PROM Assessment Site Knee   Right/Left Knee Left   Left Knee Extension 0   Left  Knee Flexion 71 supine  70 sitting   Strength   Overall Strength Comments Good quad contraction; unable to perform SLR due to groin pain; left hip flex 3+/5, ABD 4/5; ext 4/5   Strength Assessment Site Knee   Right/Left Knee Left   Left Knee Flexion 3+/5   Left Knee Extension 4-/5   Palpation   Palpation tender Left ITB, hamstrings muscle bellies, lateral calf   Ambulation/Gait   Ambulation/Gait Yes   Ambulation Distance (Feet) 20 Feet   Assistive device Straight cane   Gait Pattern Decreased step length - right;Decreased step length - left;Decreased stance time - left;Decreased hip/knee flexion - left;Decreased dorsiflexion - right;Decreased dorsiflexion - left   Ambulation Surface Level                   [facility] Adult PT Treatment/Exercise - 09/07/14 0001    Exercises   Exercises Knee/Hip   Knee/Hip Exercises: Stretches   Passive Hamstring Stretch 1 rep;30 seconds  towel under ankle   Knee: Self-Stretch to increase Flexion 1 rep;20 seconds  seated with green tband; low load long duration   Knee: Self-Stretch Limitations pain   Knee/Hip Exercises: Seated   Other Seated Knee Exercises marching x 5   Knee/Hip Exercises: Supine   Quad Sets Left;1 set;5 reps  5 sec hold   Short Arc Quad Sets Left;1 set;10 reps   Heel Slides 1 set;5 reps   Heel Slides Limitations strenth and ROM   Straight Leg Raises --  unable to perform   Straight Leg Raises Limitations strained quad/HF   Modalities   Modalities Cryotherapy;Electrical Stimulation   Cryotherapy   Number Minutes Cryotherapy 15 Minutes   Cryotherapy Location Knee   Type of Cryotherapy Ice pack   Electrical Stimulation   Electrical Stimulation Location Lt knee   Electrical Stimulation Action premod ch 1 post thigh; ch 2 knee   Electrical Stimulation Parameters 80-150 hz thigh; 1-10 hz knee   Electrical Stimulation Goals Edema;Pain   Manual Therapy   Manual Therapy Joint mobilization   Joint Mobilization patellar  mobs inf/sup                PT Education - 09/07/14 1139    Education provided Yes   Education Details HEP   Person(s) Educated Patient   Methods Explanation;Demonstration;Handout   Comprehension Returned demonstration;Verbalized understanding          PT Short Term Goals -  Sep 23, 2014 1152    PT SHORT TERM GOAL #1   Title I with initial HEP   Time 2   Period Weeks   Status New   PT SHORT TERM GOAL #2   Title improve left knee flexion to 90 degrees   Time 2   Period Weeks   Status New   PT SHORT TERM GOAL #3   Title able to perform a SLR on left   Time 2   Period Weeks   Status New           PT Long Term Goals - 09-23-14 1153    PT LONG TERM GOAL #1   Title I with advanced HEP   Time 4   Period Weeks   Status New   PT LONG TERM GOAL #2   Title improve left knee flexion to 120 degrees   Time 4   Period Weeks   Status New   PT LONG TERM GOAL #3   Title able to ambulate without AD with normalized gait pattern   Time 4   Period Weeks   Status New   PT LONG TERM GOAL #4   Title demo 4+/5 LLE strength   Time 4   Period Weeks   Status New   PT LONG TERM GOAL #5   Title decreased pain by 50% with ADLS   Time 4   Period Weeks   Status New   Additional Long Term Goals   Additional Long Term Goals Yes   PT LONG TERM GOAL #6   Title improve FOTO to 44%   Baseline 4   Period Weeks   Status New               Plan - Sep 23, 2014 1144    Clinical Impression Statement Patient is a pleasant 69 year old female who had a L TKR 08/16/14. Patient has significant knee flexion deficits affecting gait and ADLS. She has decreased LLE strength and cannot perform a SLR on the left. She ambulates with a SPC in the community. She also has pain in the knee and posterior thigh as well as edema.   Pt will benefit from skilled therapeutic intervention in order to improve on the following deficits Abnormal gait;Decreased range of motion;Increased edema;Decreased  activity tolerance;Decreased scar mobility;Pain;Decreased mobility;Decreased strength   PT Frequency 3x / week   PT Duration 4 weeks   PT Treatment/Interventions ADLs/Self Care Home Management;Moist Heat;Patient/family education;Scar mobilization;Therapeutic exercise;Passive range of motion;Ultrasound;Gait training;Balance training;Manual techniques;Cryotherapy;Stair training;Neuromuscular re-education;Electrical Stimulation   PT Next Visit Plan ROM, strengthening, modalities for pain/edema   Consulted and Agree with Plan of Care Patient          G-Codes - 09/23/2014 1158    Functional Assessment Tool Used FOTO   Functional Limitation Mobility: Walking and moving around   Mobility: Walking and Moving Around Current Status [phone]) At least 40 percent but less than 60 percent impaired, limited or restricted   Mobility: Walking and Moving Around Goal Status [phone]) At least 40 percent but less than 60 percent impaired, limited or restricted       Problem List Patient Active Problem List   Diagnosis Date Noted  . OA (osteoarthritis) of knee 08/16/2014  . Postoperative anemia due to acute blood loss 11/12/2013  . OA (osteoarthritis) of hip 11/11/2013  . Palpitations 03/11/2012    Solon Palm PT  Sep 23, 2014, 12:14 PM  [facility] [place] [address] [place], [place],  [address] Phone: [phone]   Fax:  [phone]

## 2014-09-07 NOTE — Patient Instructions (Signed)
  POSITIONING :  To Stretch the back of your knee: Elevate leg above heart, with knee resting into extension.  Place pillow under ankle, never under knee.    Quad Set  With other leg bent, foot flat, slowly tighten muscles on thigh of straight leg while counting out loud to _5___.  Repeat _15__ times. Do _2___ sessions per day.   KNEE: Extension, Short Arc Quads - Supine   Place bolster under knees. Raise left leg until knee is straight. 10___ reps per set, 3__ sets. 2 x per day.   Copyright  VHI. All rights reserved.      Heel Slide  Bend left knee and pull heel toward buttocks. Use sheet, strap, or rope to actively assist knee further into flexion. Hold stretch 30 secs.  Repeat _3___ times. Do __3-4__ sessions per day.   FLEXION: Sitting (Active)  Sit, both feet flat. Lift right knee toward ceiling. Use ___ lbs. Complete 2-3_ sets of _10__ repetitions. Perform _2__ sessions per day.  Copyright  VHI. All rights reserved.  Solon PalmJulie Lonn Im, PT 09/07/2014 11:28 AM [facility]

## 2014-09-08 ENCOUNTER — Ambulatory Visit: Payer: Medicare Other | Admitting: Rehabilitation

## 2014-09-09 ENCOUNTER — Encounter: Payer: Self-pay | Admitting: Physical Therapy

## 2014-09-09 ENCOUNTER — Ambulatory Visit: Payer: Medicare Other | Admitting: Physical Therapy

## 2014-09-09 DIAGNOSIS — M25662 Stiffness of left knee, not elsewhere classified: Secondary | ICD-10-CM | POA: Diagnosis not present

## 2014-09-09 NOTE — Therapy (Signed)
[facility] [address] [place], [place], [address] Phone: [phone]   Fax:  [phone]  Physical Therapy Treatment  Patient Details  Name: Kimberly Mccullough MRN: [id number] Date of Birth: [birth year] Referring Provider:  Ollen GrossAluisio, Frank, MD  Encounter Date: 09/09/2014      PT End of Session - 09/09/14 1443    Visit Number 2   PT Start Time 1405   PT Stop Time 1500   PT Time Calculation (min) 55 min      Past Medical History  Diagnosis Date  . Hyperlipidemia   . PTSD (post-traumatic stress disorder)   . No blood products   . Arthritis   . Rapid heart beat     takes metoprolol to treat / denies HBP  . Hypertension   . Osteopenia   . Tinnitus     Past Surgical History  Procedure Laterality Date  . Abdominal hysterectomy    . Cholecystectomy    . Bunionectomy      BILATERAL  . Total hip arthroplasty Left 11/11/2013    Procedure: LEFT TOTAL HIP ARTHROPLASTY ANTERIOR APPROACH;  Surgeon: Loanne DrillingFrank Aluisio V, MD;  Location: [facility];  Service: Orthopedics;  Laterality: Left;  . Total knee arthroplasty Left 08/16/2014    Procedure: LEFT TOTAL KNEE ARTHROPLASTY;  Surgeon: Ollen GrossFrank Aluisio, MD;  Location: [facility];  Service: Orthopedics;  Laterality: Left;    There were no vitals filed for this visit.  Visit Diagnosis:  Stiffness of knee joint, left      Subjective Assessment - 09/09/14 1411    Subjective just so very tight and swollen, thought I should be further along. Pt has SPC but picks up with ambulation. Pt verbalizes doing HEP.            [facility] - 09/09/14 0001    AROM   AROM Assessment Site Knee   Right/Left Knee Left   Left Knee Extension -3   Left Knee Flexion 93  AROM after MT                     [facility] Adult PT Treatment/Exercise - 09/09/14 0001    Knee/Hip Exercises: Aerobic   Stationary Bike 6 min rocking for ROM   Elliptical Nustep L 4 6 min with UE   Cryotherapy   Number Minutes Cryotherapy 15 Minutes   Cryotherapy Location Knee   Type of Cryotherapy Ice pack   Electrical Stimulation   Electrical Stimulation Location left knee   Electrical Stimulation Action IFC   Electrical Stimulation Goals Edema;Pain   Manual Therapy   Manual Therapy Joint mobilization;Myofascial release;Passive ROM   Joint Mobilization patellar mobs inf/sup, flex/ext belt mobs   Myofascial Release tissue around knee mostly laterally   Passive ROM flex/ext                PT Education - 09/09/14 1442    Education provided Yes   Education Details knee flexion in rocker to increase mvmt   Person(s) Educated Patient;Child(ren)   Methods Explanation;Demonstration   Comprehension Verbalized understanding;Returned demonstration          PT Short Term Goals - 09/07/14 1152    PT SHORT TERM GOAL #1   Title I with initial HEP   Time 2   Period Weeks   Status New   PT SHORT TERM GOAL #2   Title improve left knee flexion to 90 degrees   Time 2  Period Weeks   Status New   PT SHORT TERM GOAL #3   Title able to perform a SLR on left   Time 2   Period Weeks   Status New           PT Long Term Goals - 09/07/14 1153    PT LONG TERM GOAL #1   Title I with advanced HEP   Time 4   Period Weeks   Status New   PT LONG TERM GOAL #2   Title improve left knee flexion to 120 degrees   Time 4   Period Weeks   Status New   PT LONG TERM GOAL #3   Title able to ambulate without AD with normalized gait pattern   Time 4   Period Weeks   Status New   PT LONG TERM GOAL #4   Title demo 4+/5 LLE strength   Time 4   Period Weeks   Status New   PT LONG TERM GOAL #5   Title decreased pain by 50% with ADLS   Time 4   Period Weeks   Status New   Additional Long Term Goals   Additional Long Term Goals Yes   PT LONG TERM GOAL #6   Title improve FOTO to 44%   Baseline 4   Period Weeks   Status New               Plan - 09/09/14 1443     Clinical Impression Statement pt very limited in flexion and guarded/tearful with MT, however pt had huge increase in ROM after session. Pt educated in importance of stretching to increase ROM.Educated in ice with elevation.   PT Next Visit Plan aggressive ROM        Problem List Patient Active Problem List   Diagnosis Date Noted  . OA (osteoarthritis) of knee 08/16/2014  . Postoperative anemia due to acute blood loss 11/12/2013  . OA (osteoarthritis) of hip 11/11/2013  . Palpitations 03/11/2012    Shamaya Kauer,ANGIE PTA 09/09/2014, 2:45 PM  [facility] [address] [place], [place], [address] Phone: [phone]   Fax:  [phone]

## 2014-09-13 ENCOUNTER — Encounter: Payer: Self-pay | Admitting: Physical Therapy

## 2014-09-13 ENCOUNTER — Ambulatory Visit: Payer: Medicare Other | Admitting: Physical Therapy

## 2014-09-13 DIAGNOSIS — M7989 Other specified soft tissue disorders: Secondary | ICD-10-CM

## 2014-09-13 DIAGNOSIS — M25562 Pain in left knee: Secondary | ICD-10-CM

## 2014-09-13 DIAGNOSIS — M25662 Stiffness of left knee, not elsewhere classified: Secondary | ICD-10-CM

## 2014-09-13 DIAGNOSIS — M6281 Muscle weakness (generalized): Secondary | ICD-10-CM

## 2014-09-13 NOTE — Therapy (Signed)
[facility] [address] [place], [place], [address] Phone: [phone]   Fax:  [phone]  Physical Therapy Treatment  Patient Details  Name: Kimberly Mccullough MRN: [id number] Date of Birth: Apr 10, 1946 Referring Provider:  Ollen GrossAluisio, Frank, MD  Encounter Date: 09/13/2014      PT End of Session - 09/13/14 1029    Visit Number 3   Number of Visits 12   Date for PT Re-Evaluation 10/05/14   PT Start Time 1027   PT Stop Time 1124   PT Time Calculation (min) 57 min      Past Medical History  Diagnosis Date  . Hyperlipidemia   . PTSD (post-traumatic stress disorder)   . No blood products   . Arthritis   . Rapid heart beat     takes metoprolol to treat / denies HBP  . Hypertension   . Osteopenia   . Tinnitus     Past Surgical History  Procedure Laterality Date  . Abdominal hysterectomy    . Cholecystectomy    . Bunionectomy      BILATERAL  . Total hip arthroplasty Left 11/11/2013    Procedure: LEFT TOTAL HIP ARTHROPLASTY ANTERIOR APPROACH;  Surgeon: Loanne DrillingFrank Aluisio V, MD;  Location[facility];  Service: Orthopedics;  Laterality: Left;  . Total knee arthroplasty Left 08/16/2014    Procedure: LEFT TOTAL KNEE ARTHROPLASTY;  Surgeon: Ollen GrossFrank Aluisio, MD;  Location: [facility];  Service: Orthopedics;  Laterality: Left;  . Joint replacement      There were no vitals filed for this visit.  Visit Diagnosis:  Stiffness of knee joint, left  Pain in left knee  Swelling of limb  Generalized muscle weakness      Subjective Assessment - 09/13/14 1029    Subjective same complaints of tightness.   Currently in Pain? Yes   Pain Score 5    Pain Location Knee   Pain Orientation Left   Pain Type Surgical pain                         [facility] Adult PT Treatment/Exercise - 09/13/14 0001    Knee/Hip Exercises: Aerobic   Stationary Bike 6 min rocking for ROM   Elliptical Nustep L 4 6 min with UE   Knee/Hip  Exercises: Supine   Quad Sets Left;5 reps   Straight Leg Raises Left  able to perform in small ROM without pain   Cryotherapy   Number Minutes Cryotherapy 15 Minutes   Cryotherapy Location Knee   Type of Cryotherapy Ice pack   Electrical Stimulation   Electrical Stimulation Location Lt knee   Electrical Stimulation Action IFC   Electrical Stimulation Parameters 1-10Hz    Electrical Stimulation Goals Edema   Manual Therapy   Manual Therapy Joint mobilization;Taping   Joint Mobilization patellar mobs inf/sup, flex/ext mobs iin supine and sitting   Passive ROM flex/ext   Kinesiotex Edema  lantern bil                  PT Short Term Goals - 09/07/14 1152    PT SHORT TERM GOAL #1   Title I with initial HEP   Time 2   Period Weeks   Status New   PT SHORT TERM GOAL #2   Title improve left knee flexion to 90 degrees   Time 2   Period Weeks   Status New   PT SHORT TERM GOAL #3   Title  able to perform a SLR on left   Time 2   Period Weeks   Status New           PT Long Term Goals - 09/07/14 1153    PT LONG TERM GOAL #1   Title I with advanced HEP   Time 4   Period Weeks   Status New   PT LONG TERM GOAL #2   Title improve left knee flexion to 120 degrees   Time 4   Period Weeks   Status New   PT LONG TERM GOAL #3   Title able to ambulate without AD with normalized gait pattern   Time 4   Period Weeks   Status New   PT LONG TERM GOAL #4   Title demo 4+/5 LLE strength   Time 4   Period Weeks   Status New   PT LONG TERM GOAL #5   Title decreased pain by 50% with ADLS   Time 4   Period Weeks   Status New   Additional Long Term Goals   Additional Long Term Goals Yes   PT LONG TERM GOAL #6   Title improve FOTO to 44%   Baseline 4   Period Weeks   Status New               Plan - 09/13/14 1135    Clinical Impression Statement Patient continues to have limited flexion, but tolerated manual therapy without tears.   PT Next Visit Plan  aggressive ROM, assess Ktape for edema        Problem List Patient Active Problem List   Diagnosis Date Noted  . OA (osteoarthritis) of knee 08/16/2014  . Postoperative anemia due to acute blood loss 11/12/2013  . OA (osteoarthritis) of hip 11/11/2013  . Palpitations 03/11/2012    Solon PalmJulie Refugio Vandevoorde PT  09/13/2014, 12:23 PM  [facility] [address] [place], [place], [address] Phone: [phone]   Fax:  [phone]

## 2014-09-15 ENCOUNTER — Encounter: Payer: Self-pay | Admitting: *Deleted

## 2014-09-15 ENCOUNTER — Ambulatory Visit: Payer: Medicare Other | Admitting: Rehabilitation

## 2014-09-15 DIAGNOSIS — M25662 Stiffness of left knee, not elsewhere classified: Secondary | ICD-10-CM | POA: Diagnosis not present

## 2014-09-15 DIAGNOSIS — M25562 Pain in left knee: Secondary | ICD-10-CM

## 2014-09-15 DIAGNOSIS — R269 Unspecified abnormalities of gait and mobility: Secondary | ICD-10-CM

## 2014-09-15 DIAGNOSIS — M7989 Other specified soft tissue disorders: Secondary | ICD-10-CM

## 2014-09-15 NOTE — Therapy (Signed)
[facility] [address] [place], [place], [address] Phone: [phone]   Fax:  [phone]  Physical Therapy Treatment  Patient Details  Name: Kimberly Mccullough MRN: [id number] Date of Birth: 1945/10/08 Referring Provider:  Ollen GrossAluisio, Frank, MD  Encounter Date: 09/15/2014      PT End of Session - 09/15/14 1250    Visit Number 4   PT Start Time 1025   PT Stop Time 1125   PT Time Calculation (min) 60 min   Activity Tolerance Patient tolerated treatment well;Patient limited by pain      Past Medical History  Diagnosis Date  . Hyperlipidemia   . PTSD (post-traumatic stress disorder)   . No blood products   . Arthritis   . Rapid heart beat     takes metoprolol to treat / denies HBP  . Hypertension   . Osteopenia   . Tinnitus     Past Surgical History  Procedure Laterality Date  . Abdominal hysterectomy    . Cholecystectomy    . Bunionectomy      BILATERAL  . Total hip arthroplasty Left 11/11/2013    Procedure: LEFT TOTAL HIP ARTHROPLASTY ANTERIOR APPROACH;  Surgeon: Loanne DrillingFrank Aluisio V, MD;  [facility]: [facility];  Service: Orthopedics;  Laterality: Left;  . Total knee arthroplasty Left 08/16/2014    Procedure: LEFT TOTAL KNEE ARTHROPLASTY;  Surgeon: Ollen GrossFrank Aluisio, MD;  Location: [facility];  Service: Orthopedics;  Laterality: Left;  . Joint replacement      There were no vitals filed for this visit.  Visit Diagnosis:  Pain in left knee  Stiffness of knee joint, left  Swelling of limb  Abnormality of gait      Subjective Assessment - 09/15/14 1248    Subjective No changes noted in edema with taping.    L knee still very swollen and warm.   pt. afraid of manipulation likely to be scheduled next week when f/u with MD.   Hoping to get ROM to 120 by then   Pain Score 6                          [facility] Adult PT Treatment/Exercise - 09/15/14 0001    Knee/Hip Exercises: Aerobic   Stationary Bike 6'  partial ROM   Elliptical Nu Step L0 x 6'   Knee/Hip Exercises: Seated   Other Seated Knee Exercises sit n scoot x 1' x 4   Other Seated Knee Exercises sit n prop x 1' x 4   Knee/Hip Exercises: Supine   Quad Sets 20 reps   Short Arc Quad Sets 20 reps   Straight Leg Raises 20 reps   Other Supine Knee Exercises sktc knee flexion stretch with manual OP and active knee flex into the stretch x 2' x 4   Knee/Hip Exercises: Prone   Other Prone Exercises passive knee flex x 2' x 4 rep wiht CR and active knee flex into stretch with man OP   Cryotherapy   Number Minutes Cryotherapy 15 Minutes   Cryotherapy Location Knee   Type of Cryotherapy Ice pack   Electrical Stimulation   Electrical Stimulation Location L knee   Electrical Stimulation Action IFC   Electrical Stimulation Parameters to tolerance   Electrical Stimulation Goals Pain                PT Education - 09/15/14 1250    Education provided Yes   Education  Details SKTC knee flexion stretch;    push all stretching harder than currently doing   Person(s) Educated Patient   Methods Explanation;Demonstration   Comprehension Verbalized understanding;Returned demonstration          PT Short Term Goals - 09/07/14 1152    PT SHORT TERM GOAL #1   Title I with initial HEP   Time 2   Period Weeks   Status New   PT SHORT TERM GOAL #2   Title improve left knee flexion to 90 degrees   Time 2   Period Weeks   Status New   PT SHORT TERM GOAL #3   Title able to perform a SLR on left   Time 2   Period Weeks   Status New           PT Long Term Goals - 09/07/14 1153    PT LONG TERM GOAL #1   Title I with advanced HEP   Time 4   Period Weeks   Status New   PT LONG TERM GOAL #2   Title improve left knee flexion to 120 degrees   Time 4   Period Weeks   Status New   PT LONG TERM GOAL #3   Title able to ambulate without AD with normalized gait pattern   Time 4   Period Weeks   Status New   PT LONG TERM GOAL #4    Title demo 4+/5 LLE strength   Time 4   Period Weeks   Status New   PT LONG TERM GOAL #5   Title decreased pain by 50% with ADLS   Time 4   Period Weeks   Status New   Additional Long Term Goals   Additional Long Term Goals Yes   PT LONG TERM GOAL #6   Title improve FOTO to 44%   Baseline 4   Period Weeks   Status New               Plan - 09/15/14 1252    Clinical Impression Statement Pt. with very low pain tolerance with flexion stretching which is limiting progress.    ROM before treatment -5 to 70 L knee;   ROM after PT -5 to 94 deg.    Pt. advised to wear thigh high support hose for edema, ice every 1-2 hrs   PT Next Visit Plan continue agressive stretching with manual overpressure, CR with active;            Problem List Patient Active Problem List   Diagnosis Date Noted  . OA (osteoarthritis) of knee 08/16/2014  . Postoperative anemia due to acute blood loss 11/12/2013  . OA (osteoarthritis) of hip 11/11/2013  . Palpitations 03/11/2012    Mette Southgate, PT  09/15/2014, 12:55 PM  [facility] [address] [place], [place], [address] Phone: [phone]   Fax:  [phone]

## 2014-09-17 ENCOUNTER — Ambulatory Visit: Payer: Medicare Other | Admitting: Rehabilitation

## 2014-09-17 ENCOUNTER — Ambulatory Visit: Payer: Medicare Other | Admitting: Physical Therapy

## 2014-09-17 DIAGNOSIS — M7989 Other specified soft tissue disorders: Secondary | ICD-10-CM

## 2014-09-17 DIAGNOSIS — M25662 Stiffness of left knee, not elsewhere classified: Secondary | ICD-10-CM | POA: Diagnosis not present

## 2014-09-17 DIAGNOSIS — M25562 Pain in left knee: Secondary | ICD-10-CM

## 2014-09-17 NOTE — Therapy (Signed)
[facility] [address] [place], [place], [address] Phone: [phone]   Fax:  [phone]  Physical Therapy Treatment  Patient Details  Name: Tilman Neatrlene C Koo MRN: [id number] Date of Birth: 1945-05-25 Referring Provider:  Ollen GrossAluisio, Frank, MD  Encounter Date: 09/17/2014      PT End of Session - 09/17/14 1154    Visit Number 5   PT Start Time 1025   PT Stop Time 1125   PT Time Calculation (min) 60 min      Past Medical History  Diagnosis Date  . Hyperlipidemia   . PTSD (post-traumatic stress disorder)   . No blood products   . Arthritis   . Rapid heart beat     takes metoprolol to treat / denies HBP  . Hypertension   . Osteopenia   . Tinnitus     Past Surgical History  Procedure Laterality Date  . Abdominal hysterectomy    . Cholecystectomy    . Bunionectomy      BILATERAL  . Total hip arthroplasty Left 11/11/2013    Procedure: LEFT TOTAL HIP ARTHROPLASTY ANTERIOR APPROACH;  Surgeon: Loanne DrillingFrank Aluisio V, MD;  [facility];  Service: Orthopedics;  Laterality: Left;  . Total knee arthroplasty Left 08/16/2014    Procedure: LEFT TOTAL KNEE ARTHROPLASTY;  Surgeon: Ollen GrossFrank Aluisio, MD;  [facility];  Service: Orthopedics;  Laterality: Left;  . Joint replacement      There were no vitals filed for this visit.  Visit Diagnosis:  Pain in left knee  Stiffness of knee joint, left  Swelling of limb      Subjective Assessment - 09/17/14 1156    Subjective Pt. reports has been wearing thigh high support hose, although does not have them on today.     Pain Score 6    Pain Location Knee   Pain Orientation Left   Pain Type Surgical pain   Pain Onset 1 to 4 weeks ago   Pain Frequency Intermittent                         [facility] Adult PT Treatment/Exercise - 09/17/14 0001    Knee/Hip Exercises: Aerobic   Stationary Bike 12' full revolutions at times   Elliptical NuStep L4 x 6'   Knee/Hip Exercises: Seated   Other Seated Knee Exercises sit n scoot flexion stretch 1' x 6   Other Seated Knee Exercises sit n prop with manual OP x 1' x 3   Knee/Hip Exercises: Supine   Quad Sets 20 reps   Short Arc Quad Sets 20 reps   Other Supine Knee Exercises SKTC knee flex stretch x 1' x 4 with 5# ankle wt   Knee/Hip Exercises: Prone   Other Prone Exercises strap assist flexion stretch x 1' x 4   Other Prone Exercises PT assist manual flex stretch x 3 with end range CR    Cryotherapy   Number Minutes Cryotherapy 15 Minutes   Cryotherapy Location Knee   Type of Cryotherapy Ice pack   Electrical Stimulation   Electrical Stimulation Location L knee   Electrical Stimulation Action IFC   Electrical Stimulation Parameters to tolerance   Electrical Stimulation Goals Pain                PT Education - 09/17/14 1201    Education provided Yes   Education Details compliance with HEP and pushing into mild/mod discomfort  with end range  Person(s) Educated Patient   Methods Explanation   Comprehension Verbalized understanding          PT Short Term Goals - 09/07/14 1152    PT SHORT TERM GOAL #1   Title I with initial HEP   Time 2   Period Weeks   Status New   PT SHORT TERM GOAL #2   Title improve left knee flexion to 90 degrees   Time 2   Period Weeks   Status New   PT SHORT TERM GOAL #3   Title able to perform a SLR on left   Time 2   Period Weeks   Status New           PT Long Term Goals - 09/07/14 1153    PT LONG TERM GOAL #1   Title I with advanced HEP   Time 4   Period Weeks   Status New   PT LONG TERM GOAL #2   Title improve left knee flexion to 120 degrees   Time 4   Period Weeks   Status New   PT LONG TERM GOAL #3   Title able to ambulate without AD with normalized gait pattern   Time 4   Period Weeks   Status New   PT LONG TERM GOAL #4   Title demo 4+/5 LLE strength   Time 4   Period Weeks   Status New   PT LONG TERM GOAL #5    Title decreased pain by 50% with ADLS   Time 4   Period Weeks   Status New   Additional Long Term Goals   Additional Long Term Goals Yes   PT LONG TERM GOAL #6   Title improve FOTO to 44%   Baseline 4   Period Weeks   Status New               Plan - 09/17/14 1202    Clinical Impression Statement ROM remains -5 to 94 degrees at the end of treatment.    Pt. comes in very stiff, low pain tolerance   PT Next Visit Plan continue to work on manual flex/extension stretching   Consulted and Agree with Plan of Care Patient        Problem List Patient Active Problem List   Diagnosis Date Noted  . OA (osteoarthritis) of knee 08/16/2014  . Postoperative anemia due to acute blood loss 11/12/2013  . OA (osteoarthritis) of hip 11/11/2013  . Palpitations 03/11/2012    Madisan Bice, PT 09/17/2014, 12:04 PM  [facility] [address] [place], [place], [address] Phone: [phone]   Fax:  [phone]

## 2014-09-20 ENCOUNTER — Ambulatory Visit: Payer: Medicare Other | Admitting: Physical Therapy

## 2014-09-20 DIAGNOSIS — M25562 Pain in left knee: Secondary | ICD-10-CM

## 2014-09-20 DIAGNOSIS — M25662 Stiffness of left knee, not elsewhere classified: Secondary | ICD-10-CM

## 2014-09-20 DIAGNOSIS — R269 Unspecified abnormalities of gait and mobility: Secondary | ICD-10-CM

## 2014-09-20 DIAGNOSIS — M6281 Muscle weakness (generalized): Secondary | ICD-10-CM

## 2014-09-20 DIAGNOSIS — M7989 Other specified soft tissue disorders: Secondary | ICD-10-CM

## 2014-09-20 NOTE — Therapy (Signed)
[facility] [address] [place], [place], [address] Phone: [phone]   Fax:  [phone]  Physical Therapy Treatment  Patient Details  Name: Kimberly Mccullough MRN: [id number] Date of Birth: [birth year] Referring Provider:  Ollen GrossAluisio, Frank, MD  Encounter Date: 09/20/2014      PT End of Session - 09/20/14 1101    Visit Number 6   Number of Visits 12   Date for PT Re-Evaluation 10/05/14   PT Start Time 1018   PT Stop Time 1118   PT Time Calculation (min) 60 min   Activity Tolerance Patient tolerated treatment well;Patient limited by pain   Behavior During Therapy [facility] for tasks assessed/performed      Past Medical History  Diagnosis Date  . Hyperlipidemia   . PTSD (post-traumatic stress disorder)   . No blood products   . Arthritis   . Rapid heart beat     takes metoprolol to treat / denies HBP  . Hypertension   . Osteopenia   . Tinnitus     Past Surgical History  Procedure Laterality Date  . Abdominal hysterectomy    . Cholecystectomy    . Bunionectomy      BILATERAL  . Total hip arthroplasty Left 11/11/2013    Procedure: LEFT TOTAL HIP ARTHROPLASTY ANTERIOR APPROACH;  Surgeon: Loanne DrillingFrank Aluisio V, MD;  [facility];  Service: Orthopedics;  Laterality: Left;  . Total knee arthroplasty Left 08/16/2014    Procedure: LEFT TOTAL KNEE ARTHROPLASTY;  Surgeon: Ollen GrossFrank Aluisio, MD;  Location[facility];  Service: Orthopedics;  Laterality: Left;  . Joint replacement      There were no vitals filed for this visit.  Visit Diagnosis:  Pain in left knee  Stiffness of knee joint, left  Swelling of limb  Abnormality of gait  Generalized muscle weakness      Subjective Assessment - 09/20/14 1024    Subjective L knee a little stiff and sore this morning   Patient Stated Goals walk with a normal gait pattern and without AD and that she can bend her knee.   Currently in Pain? No/denies  just "stiffness" L knee             [facility] PT Assessment - 09/20/14 1052    AROM   AROM Assessment Site Knee   Right/Left Knee Left   Left Knee Extension 5   Left Knee Flexion 94   PROM   PROM Assessment Site Knee   Right/Left Knee Left   Left Knee Extension 3   Left Knee Flexion 102                     [facility] Adult PT Treatment/Exercise - 09/20/14 1026    Knee/Hip Exercises: Aerobic   Stationary Bike Seat 9 x 6 min   Elliptical NuStep L4 x 6'   Cryotherapy   Number Minutes Cryotherapy 15 Minutes   Cryotherapy Location Knee   Type of Cryotherapy Ice pack   Electrical Stimulation   Electrical Stimulation Location L knee   Electrical Stimulation Action IFC   Electrical Stimulation Parameters to tolerance x 15 min   Electrical Stimulation Goals Pain   Manual Therapy   Manual Therapy Joint mobilization;Passive ROM   Joint Mobilization patellar mobs inf/sup, flex/ext mobs iin supine and sitting   Passive ROM flex/ext                  PT Short Term Goals -  09/20/14 1102    PT SHORT TERM GOAL #1   Title I with initial HEP   Status On-going   PT SHORT TERM GOAL #2   Title improve left knee flexion to 90 degrees   Status Achieved   PT SHORT TERM GOAL #3   Title able to perform a SLR on left   Status On-going           PT Long Term Goals - 09/20/14 1102    PT LONG TERM GOAL #1   Title I with advanced HEP   Status On-going   PT LONG TERM GOAL #2   Title improve left knee flexion to 120 degrees   Status On-going   PT LONG TERM GOAL #3   Title able to ambulate without AD with normalized gait pattern   Status On-going   PT LONG TERM GOAL #4   Title demo 4+/5 LLE strength   Status On-going   PT LONG TERM GOAL #5   Title decreased pain by 50% with ADLS   Status On-going   PT LONG TERM GOAL #6   Title improve FOTO to 44%   Status On-going               Plan - 09/20/14 1101    Clinical Impression Statement AROM 5-94 degrees; PROM 3-102.  Pt with limited  tolerance to manual therapy affecting ROM progress.   PT Next Visit Plan continue to work on manual flex/extension stretching   Consulted and Agree with Plan of Care Patient        Problem List Patient Active Problem List   Diagnosis Date Noted  . OA (osteoarthritis) of knee 08/16/2014  . Postoperative anemia due to acute blood loss 11/12/2013  . OA (osteoarthritis) of hip 11/11/2013  . Palpitations 03/11/2012   Clarita Crane, PT, DPT 09/20/2014 11:04 AM  [facility] [address] [place] [address] [place], [place], [address] Phone: [phone]   Fax:  [phone]

## 2014-09-22 ENCOUNTER — Ambulatory Visit: Payer: Medicare Other | Admitting: Physical Therapy

## 2014-09-22 ENCOUNTER — Other Ambulatory Visit: Payer: Self-pay | Admitting: *Deleted

## 2014-09-22 DIAGNOSIS — M7989 Other specified soft tissue disorders: Secondary | ICD-10-CM

## 2014-09-22 DIAGNOSIS — M25562 Pain in left knee: Secondary | ICD-10-CM

## 2014-09-22 DIAGNOSIS — M25662 Stiffness of left knee, not elsewhere classified: Secondary | ICD-10-CM | POA: Diagnosis not present

## 2014-09-22 DIAGNOSIS — R269 Unspecified abnormalities of gait and mobility: Secondary | ICD-10-CM

## 2014-09-22 DIAGNOSIS — M6281 Muscle weakness (generalized): Secondary | ICD-10-CM

## 2014-09-22 MED ORDER — METOPROLOL TARTRATE 25 MG PO TABS: 25.0000 mg | ORAL_TABLET | Freq: Two times a day (BID) | ORAL | Status: DC

## 2014-09-22 NOTE — Therapy (Signed)
[facility] [address] [place], [place], [address] Phone: [phone]   Fax:  [phone]  Physical Therapy Treatment  Patient Details  Name: Kimberly Mccullough MRN: [id number] Date of Birth: 05/21/1945 Referring Provider:  Ollen GrossAluisio, Frank, MD  Encounter Date: 09/22/2014      PT End of Session - 09/22/14 1058    Visit Number 7   Number of Visits 12   Date for PT Re-Evaluation 10/05/14   PT Start Time 1023  pt arrived late   PT Stop Time 1110   PT Time Calculation (min) 47 min   Activity Tolerance Patient tolerated treatment well;Patient limited by pain   Behavior During Therapy [facility] for tasks assessed/performed      Past Medical History  Diagnosis Date  . Hyperlipidemia   . PTSD (post-traumatic stress disorder)   . No blood products   . Arthritis   . Rapid heart beat     takes metoprolol to treat / denies HBP  . Hypertension   . Osteopenia   . Tinnitus     Past Surgical History  Procedure Laterality Date  . Abdominal hysterectomy    . Cholecystectomy    . Bunionectomy      BILATERAL  . Total hip arthroplasty Left 11/11/2013    Procedure: LEFT TOTAL HIP ARTHROPLASTY ANTERIOR APPROACH;  Surgeon: Loanne DrillingFrank Aluisio V, MD;  [facility];  Service: Orthopedics;  Laterality: Left;  . Total knee arthroplasty Left 08/16/2014    Procedure: LEFT TOTAL KNEE ARTHROPLASTY;  Surgeon: Ollen GrossFrank Aluisio, MD;  Location: [facility];  Service: Orthopedics;  Laterality: Left;  . Joint replacement      There were no vitals filed for this visit.  Visit Diagnosis:  Pain in left knee  Stiffness of knee joint, left  Swelling of limb  Abnormality of gait  Generalized muscle weakness      Subjective Assessment - 09/22/14 1025    Subjective Pt arrived late to PT session; feels like L knee "needs to pop."   Limitations Sitting   Patient Stated Goals walk with a normal gait pattern and without AD and that she can bend her  knee.   Currently in Pain? Yes   Pain Score 2    Pain Location Knee   Pain Orientation Left   Pain Descriptors / Indicators Tightness   Pain Type Surgical pain   Pain Onset More than a month ago   Pain Frequency Intermittent   Aggravating Factors  sitting   Pain Relieving Factors standing up; repositioning                         [facility] Adult PT Treatment/Exercise - 09/22/14 1026    Knee/Hip Exercises: Aerobic   Stationary Bike Seat 9 x 6 min   Elliptical NuStep L5 x 6'   Knee/Hip Exercises: Machines for Strengthening   Cybex Knee Extension 5# 3x10 with overpressure between sets for flexion   Cybex Knee Flexion 15# 3x10   Cryotherapy   Number Minutes Cryotherapy 15 Minutes   Cryotherapy Location Knee   Type of Cryotherapy Ice pack   Electrical Stimulation   Electrical Stimulation Location L knee   Electrical Stimulation Action IFC   Electrical Stimulation Parameters to tolerance x 15 min   Electrical Stimulation Goals Pain                  PT Short Term Goals - 09/20/14 1102  PT SHORT TERM GOAL #1   Title I with initial HEP   Status On-going   PT SHORT TERM GOAL #2   Title improve left knee flexion to 90 degrees   Status Achieved   PT SHORT TERM GOAL #3   Title able to perform a SLR on left   Status On-going           PT Long Term Goals - 09/20/14 1102    PT LONG TERM GOAL #1   Title I with advanced HEP   Status On-going   PT LONG TERM GOAL #2   Title improve left knee flexion to 120 degrees   Status On-going   PT LONG TERM GOAL #3   Title able to ambulate without AD with normalized gait pattern   Status On-going   PT LONG TERM GOAL #4   Title demo 4+/5 LLE strength   Status On-going   PT LONG TERM GOAL #5   Title decreased pain by 50% with ADLS   Status On-going   PT LONG TERM GOAL #6   Title improve FOTO to 44%   Status On-going               Plan - 09/22/14 1058    PT Next Visit Plan continue to work on  manual flex/extension stretching; strengthening   Consulted and Agree with Plan of Care Patient        Problem List Patient Active Problem List   Diagnosis Date Noted  . OA (osteoarthritis) of knee 08/16/2014  . Postoperative anemia due to acute blood loss 11/12/2013  . OA (osteoarthritis) of hip 11/11/2013  . Palpitations 03/11/2012   Clarita Crane, PT, DPT 09/22/2014 12:48 PM  [facility] [address] [place] [address] [place], [place], [address] Phone: [phone]   Fax:  [phone]

## 2014-09-24 ENCOUNTER — Encounter: Payer: Self-pay | Admitting: Physical Therapy

## 2014-09-24 ENCOUNTER — Ambulatory Visit: Payer: Medicare Other | Admitting: Physical Therapy

## 2014-09-24 DIAGNOSIS — R269 Unspecified abnormalities of gait and mobility: Secondary | ICD-10-CM

## 2014-09-24 DIAGNOSIS — M25662 Stiffness of left knee, not elsewhere classified: Secondary | ICD-10-CM | POA: Diagnosis not present

## 2014-09-24 DIAGNOSIS — M6281 Muscle weakness (generalized): Secondary | ICD-10-CM

## 2014-09-24 NOTE — Therapy (Signed)
[facility] [address] [place], [place], [address] Phone: [phone]   Fax:  [phone]  Physical Therapy Treatment  Patient Details  Name: Kimberly Mccullough MRN: [id number] Date of Birth: 01/11/1946 Referring Provider:  Ollen GrossAluisio, Frank, MD  Encounter Date: 09/24/2014      PT End of Session - 09/24/14 1050    Visit Number 8   Number of Visits 12   Date for PT Re-Evaluation 10/05/14   PT Start Time 1020   PT Stop Time 1120   PT Time Calculation (min) 60 min      Past Medical History  Diagnosis Date  . Hyperlipidemia   . PTSD (post-traumatic stress disorder)   . No blood products   . Arthritis   . Rapid heart beat     takes metoprolol to treat / denies HBP  . Hypertension   . Osteopenia   . Tinnitus     Past Surgical History  Procedure Laterality Date  . Abdominal hysterectomy    . Cholecystectomy    . Bunionectomy      BILATERAL  . Total hip arthroplasty Left 11/11/2013    Procedure: LEFT TOTAL HIP ARTHROPLASTY ANTERIOR APPROACH;  Surgeon: Loanne DrillingFrank Aluisio V, MD;  Location: [facility];  Service: Orthopedics;  Laterality: Left;  . Total knee arthroplasty Left 08/16/2014    Procedure: LEFT TOTAL KNEE ARTHROPLASTY;  Surgeon: Ollen GrossFrank Aluisio, MD;  Location: [facility];  Service: Orthopedics;  Laterality: Left;  . Joint replacement      There were no vitals filed for this visit.  Visit Diagnosis:  Stiffness of knee joint, left  Abnormality of gait  Generalized muscle weakness      Subjective Assessment - 09/24/14 1022    Subjective "knock" in knee, just always anticipating it   Currently in Pain? Yes   Pain Score 4    Pain Location Knee   Pain Orientation Left                         [facility] Adult PT Treatment/Exercise - 09/24/14 0001    Knee/Hip Exercises: Aerobic   Stationary Bike Seat 9 x 6 min  after 2 min    Elliptical I 10 R3 3 min   to increase knee flexion with weight bearing    Tread Mill Nustep L5 6 min   Knee/Hip Exercises: Machines for Strengthening   Cybex Knee Extension 5 # 2 sets 10 Left only   Cybex Knee Flexion 10# 2x10  left only   Cybex Leg Press 40# 2 sets 10   Total Gym Leg Press Left only no weight 10 times for ROM   Knee/Hip Exercises: Standing   Forward Step Up Left;2 sets;10 reps  BOSU   Cryotherapy   Number Minutes Cryotherapy 15 Minutes   Cryotherapy Location Knee   Type of Cryotherapy Ice pack   Electrical Stimulation   Electrical Stimulation Location L knee   Electrical Stimulation Action IFC   Electrical Stimulation Goals Pain;Edema                  PT Short Term Goals - 09/24/14 1048    PT SHORT TERM GOAL #1   Title I with initial HEP   Status Achieved   PT SHORT TERM GOAL #2   Title improve left knee flexion to 90 degrees   Status Achieved   PT SHORT TERM GOAL #3   Title able to perform a  SLR on left   Status Achieved           PT Long Term Goals - 09/24/14 1047    PT LONG TERM GOAL #1   Title I with advanced HEP   Status On-going   PT LONG TERM GOAL #2   Title improve left knee flexion to 120 degrees   Status On-going   PT LONG TERM GOAL #3   Title able to ambulate without AD with normalized gait pattern   Status On-going   PT LONG TERM GOAL #4   Title demo 4+/5 LLE strength   Status On-going   PT LONG TERM GOAL #5   Title decreased pain by 50% with ADLS   Status On-going               Plan - 09/24/14 1051    Clinical Impression Statement pt fearful to bend knee and requires VCing to allow knee to bend with all exercises and with gait. Act flexion at end of session is 102 without MT. Discussed with pt her self limiting and need to be more aggressive with bending as she can do it.        Problem List Patient Active Problem List   Diagnosis Date Noted  . OA (osteoarthritis) of knee 08/16/2014  . Postoperative anemia due to acute blood loss 11/12/2013  . OA (osteoarthritis) of hip  11/11/2013  . Palpitations 03/11/2012    Ronalee Scheunemann,ANGIE PTA 09/24/2014, 11:05 AM  [facility] [place] [address] [place], [place], [address] Phone: [phone]   Fax:  [phone]

## 2014-09-29 ENCOUNTER — Encounter: Payer: Self-pay | Admitting: Physical Therapy

## 2014-09-29 ENCOUNTER — Ambulatory Visit: Payer: Medicare Other | Attending: Orthopedic Surgery | Admitting: Physical Therapy

## 2014-09-29 DIAGNOSIS — M7989 Other specified soft tissue disorders: Secondary | ICD-10-CM | POA: Diagnosis present

## 2014-09-29 DIAGNOSIS — M25562 Pain in left knee: Secondary | ICD-10-CM | POA: Insufficient documentation

## 2014-09-29 DIAGNOSIS — R269 Unspecified abnormalities of gait and mobility: Secondary | ICD-10-CM | POA: Insufficient documentation

## 2014-09-29 DIAGNOSIS — M25662 Stiffness of left knee, not elsewhere classified: Secondary | ICD-10-CM | POA: Insufficient documentation

## 2014-09-29 DIAGNOSIS — M6281 Muscle weakness (generalized): Secondary | ICD-10-CM | POA: Diagnosis present

## 2014-09-29 NOTE — Therapy (Signed)
[facility] [address] [place], [place], [address] Phone: [phone]   Fax:  [phone]  Physical Therapy Treatment  Patient Details  Name: Kimberly Mccullough MRN: [id number] Date of Birth: October 08, 1945 Referring Provider:  Ollen GrossAluisio, Frank, MD  Encounter Date: 09/29/2014      PT End of Session - 09/29/14 1148    Visit Number 9   Number of Visits 12   Date for PT Re-Evaluation 10/05/14   PT Start Time 1102   PT Stop Time 1200   PT Time Calculation (min) 58 min      Past Medical History  Diagnosis Date  . Hyperlipidemia   . PTSD (post-traumatic stress disorder)   . No blood products   . Arthritis   . Rapid heart beat     takes metoprolol to treat / denies HBP  . Hypertension   . Osteopenia   . Tinnitus     Past Surgical History  Procedure Laterality Date  . Abdominal hysterectomy    . Cholecystectomy    . Bunionectomy      BILATERAL  . Total hip arthroplasty Left 11/11/2013    Procedure: LEFT TOTAL HIP ARTHROPLASTY ANTERIOR APPROACH;  Surgeon: Loanne DrillingFrank Mccullough V, MD;  Location: [facility];  Service: Orthopedics;  Laterality: Left;  . Total knee arthroplasty Left 08/16/2014    Procedure: LEFT TOTAL KNEE ARTHROPLASTY;  Surgeon: Ollen GrossFrank Aluisio, MD;  Location: [facility];  Service: Orthopedics;  Laterality: Left;  . Joint replacement      There were no vitals filed for this visit.  Visit Diagnosis:  Stiffness of knee joint, left  Abnormality of gait  Generalized muscle weakness  Pain in left knee  Swelling of limb      Subjective Assessment - 09/29/14 1107    Subjective "Just really worried about the swelling and stiffness?   Currently in Pain? Yes   Pain Score 1    Pain Location Knee   Pain Orientation Left   Pain Descriptors / Indicators Tightness                         [facility] Adult PT Treatment/Exercise - 09/29/14 0001    Ambulation/Gait   Gait Comments around building 700 feet,  curbs, cues for bend and step length   Knee/Hip Exercises: Aerobic   Elliptical I 10 R3 3 min    Tread Mill Nustep Level 4 working on flexion so seat was closer. xx 4 minutes   Knee/Hip Exercises: Machines for Strengthening   Cybex Knee Extension 5 # 2 sets 10 Left only   Cybex Leg Press 20# single leg sets of 5 mostly eccentric   Knee/Hip Exercises: Standing   Forward Lunges --  lunge stretches foot on mat table   Walking with Sports Cord resisted gait fwd and backward   Cryotherapy   Number Minutes Cryotherapy 15 Minutes   Cryotherapy Location Knee   Type of Cryotherapy Ice pack   Electrical Stimulation   Electrical Stimulation Location L knee   Electrical Stimulation Action IFC   Electrical Stimulation Goals Pain;Edema   Manual Therapy   Joint Mobilization patellar mobs inf/sup, flex/ext mobs iin supine and sitting   Passive ROM flex/ext                  PT Short Term Goals - 09/24/14 1048    PT SHORT TERM GOAL #1   Title I with initial HEP  Status Achieved   PT SHORT TERM GOAL #2   Title improve left knee flexion to 90 degrees   Status Achieved   PT SHORT TERM GOAL #3   Title able to perform a SLR on left   Status Achieved           PT Long Term Goals - 09/24/14 1047    PT LONG TERM GOAL #1   Title I with advanced HEP   Status On-going   PT LONG TERM GOAL #2   Title improve left knee flexion to 120 degrees   Status On-going   PT LONG TERM GOAL #3   Title able to ambulate without AD with normalized gait pattern   Status On-going   PT LONG TERM GOAL #4   Title demo 4+/5 LLE strength   Status On-going   PT LONG TERM GOAL #5   Title decreased pain by 50% with ADLS   Status On-going               Plan - 09/29/14 1148    Clinical Impression Statement Patient still very fearful of bending and new activities, she demonstrates a good bend of about 80-90 degrees flexion with certain exercises, but when getting on and off machines gaurds the  knee holding it in extension and not bending   PT Next Visit Plan continue to work on manual flex/extension stretching; strengthening   Consulted and Agree with Plan of Care Patient        Problem List Patient Active Problem List   Diagnosis Date Noted  . OA (osteoarthritis) of knee 08/16/2014  . Postoperative anemia due to acute blood loss 11/12/2013  . OA (osteoarthritis) of hip 11/11/2013  . Palpitations 03/11/2012    Jearld Lesch., PT 09/29/2014, 11:51 AM  [facility] [place] [facility] [address] [place], [place], [address] Phone: [phone]   Fax:  [phone]

## 2014-10-01 ENCOUNTER — Ambulatory Visit: Payer: Medicare Other | Admitting: Rehabilitation

## 2014-10-01 DIAGNOSIS — M25562 Pain in left knee: Secondary | ICD-10-CM

## 2014-10-01 DIAGNOSIS — R269 Unspecified abnormalities of gait and mobility: Secondary | ICD-10-CM

## 2014-10-01 DIAGNOSIS — M7989 Other specified soft tissue disorders: Secondary | ICD-10-CM

## 2014-10-01 DIAGNOSIS — M25662 Stiffness of left knee, not elsewhere classified: Secondary | ICD-10-CM

## 2014-10-01 NOTE — Therapy (Addendum)
[facility] [place] [address] [place], [place], [address] Phone: [phone]   Fax:  [phone]  Physical Therapy Treatment  Patient Details  Name: Kimberly Mccullough MRN: [id number] Date of Birth: 11-Jan-1946 Referring Provider:  Ollen GrossAluisio, Frank, MD  Encounter Date: 10/01/2014      PT End of Session - 10/01/14 1129    Visit Number 10   Number of Visits 12   Date for PT Re-Evaluation 10/05/14   PT Start Time 1105   PT Stop Time 1200   PT Time Calculation (min) 55 min      Past Medical History  Diagnosis Date  . Hyperlipidemia   . PTSD (post-traumatic stress disorder)   . No blood products   . Arthritis   . Rapid heart beat     takes metoprolol to treat / denies HBP  . Hypertension   . Osteopenia   . Tinnitus     Past Surgical History  Procedure Laterality Date  . Abdominal hysterectomy    . Cholecystectomy    . Bunionectomy      BILATERAL  . Total hip arthroplasty Left 11/11/2013    Procedure: LEFT TOTAL HIP ARTHROPLASTY ANTERIOR APPROACH;  Surgeon: Loanne DrillingFrank Aluisio V, MD;  [facility];  Service: Orthopedics;  Laterality: Left;  . Total knee arthroplasty Left 08/16/2014    Procedure: LEFT TOTAL KNEE ARTHROPLASTY;  Surgeon: Ollen GrossFrank Aluisio, MD;  [facility];  Service: Orthopedics;  Laterality: Left;  . Joint replacement      There were no vitals filed for this visit.  Visit Diagnosis:  Stiffness of knee joint, left  Abnormality of gait  Pain in left knee  Swelling of limb      Subjective Assessment - 10/01/14 1114    Subjective Pt. reports still worried about the swelling.       Limitations Walking;Standing;Lifting   Currently in Pain? Yes   Pain Score 2    Pain Location Knee   Pain Orientation Left   Pain Descriptors / Indicators Aching;Sore   Pain Type Surgical pain   Pain Onset More than a month ago   Pain Frequency Intermittent                         [facility] Adult  PT Treatment/Exercise - 10/01/14 0001    Ambulation/Gait   Gait Comments outdoor unlevel x 700' resisted TB gait hills, curbs   Knee/Hip Exercises: Aerobic   Stationary Bike bike x 8' seat slowly advanced up to 5   Elliptical I10 R3 x 3'   Tread Mill Nu Step x 6' L   Knee/Hip Exercises: Machines for Strengthening   Cybex Knee Extension 5# x20 LLE only   Cybex Knee Flexion 10# x 20 LLE only   Cybex Leg Press 20# x 20 LLE only   Knee/Hip Exercises: Standing   Forward Step Up Left;20 reps   Cryotherapy   Number Minutes Cryotherapy 15 Minutes   Cryotherapy Location Knee   Type of Cryotherapy Ice pack   Electrical Stimulation   Electrical Stimulation Location L knee   Electrical Stimulation Action IFC to tolerance   Electrical Stimulation Goals Pain                  PT Short Term Goals - 09/24/14 1048    PT SHORT TERM GOAL #1   Title I with initial HEP   Status Achieved   PT SHORT TERM GOAL #  2   Title improve left knee flexion to 90 degrees   Status Achieved   PT SHORT TERM GOAL #3   Title able to perform a SLR on left   Status Achieved           PT Long Term Goals - 09/24/14 1047    PT LONG TERM GOAL #1   Title I with advanced HEP   Status On-going   PT LONG TERM GOAL #2   Title improve left knee flexion to 120 degrees   Status On-going   PT LONG TERM GOAL #3   Title able to ambulate without AD with normalized gait pattern   Status On-going   PT LONG TERM GOAL #4   Title demo 4+/5 LLE strength   Status On-going   PT LONG TERM GOAL #5   Title decreased pain by 50% with ADLS   Status On-going               Plan - 10/01/14 1201    Clinical Impression Statement ROM is -5 to 95 deg L knee.   Still tends to keep L knee extended with sitting and transfers.    Advised her to flex knee when transferring to sit and while sitting.    Advised to try to keep knee either in end range flexion or extension as much as possible.   Reviewed low load long duration  stretching.    She may need JAS brace.   PT Next Visit Plan continue to work on end range flex/extension.   Recommend JAS brace to MD on f/u        Problem List Patient Active Problem List   Diagnosis Date Noted  . OA (osteoarthritis) of knee 08/16/2014  . Postoperative anemia due to acute blood loss 11/12/2013  . OA (osteoarthritis) of hip 11/11/2013  . Palpitations 03/11/2012    Nasia Cannan, PT 10/01/2014, 12:03 PM  [facility] [place] [address] [place], [place], [address] Phone: [phone]   Fax:  [phone]     Late Entry G-Code: Assessment tool used: FOTO Functional Limitation: Mobility Current Status: CK Goal Status: Augustina Mood, PT, DPT 10/04/2014 5:11 PM  [facility] [address] [place], [place] [address]  [phone] (office) [phone] (fax)

## 2014-10-04 ENCOUNTER — Ambulatory Visit: Payer: Medicare Other | Admitting: Physical Therapy

## 2014-10-04 ENCOUNTER — Ambulatory Visit: Payer: Medicare Other

## 2014-10-04 DIAGNOSIS — M25662 Stiffness of left knee, not elsewhere classified: Secondary | ICD-10-CM | POA: Diagnosis not present

## 2014-10-04 DIAGNOSIS — R269 Unspecified abnormalities of gait and mobility: Secondary | ICD-10-CM

## 2014-10-04 DIAGNOSIS — M6281 Muscle weakness (generalized): Secondary | ICD-10-CM

## 2014-10-04 DIAGNOSIS — M25562 Pain in left knee: Secondary | ICD-10-CM

## 2014-10-04 DIAGNOSIS — M7989 Other specified soft tissue disorders: Secondary | ICD-10-CM

## 2014-10-04 NOTE — Therapy (Signed)
[facility] [place] [facility] [address] [place], [place], [address] Phone: [phone]   Fax:  [phone]  Physical Therapy Treatment  Patient Details  Name: Kimberly Mccullough MRN: [id number] Date of Birth: 04-Jun-1945 Referring Provider:  Eliott Nine,*  Encounter Date: 10/04/2014      PT End of Session - 10/04/14 1657    Visit Number 11   Number of Visits 12   Date for PT Re-Evaluation 10/05/14   PT Start Time 1620   PT Stop Time 1711   PT Time Calculation (min) 51 min   Activity Tolerance Patient tolerated treatment well;Patient limited by pain   Behavior During Therapy [facility] for tasks assessed/performed      Past Medical History  Diagnosis Date  . Hyperlipidemia   . PTSD (post-traumatic stress disorder)   . No blood products   . Arthritis   . Rapid heart beat     takes metoprolol to treat / denies HBP  . Hypertension   . Osteopenia   . Tinnitus     Past Surgical History  Procedure Laterality Date  . Abdominal hysterectomy    . Cholecystectomy    . Bunionectomy      BILATERAL  . Total hip arthroplasty Left 11/11/2013    Procedure: LEFT TOTAL HIP ARTHROPLASTY ANTERIOR APPROACH;  Surgeon: Loanne Drilling, MD;  [facility];  Service: Orthopedics;  Laterality: Left;  . Total knee arthroplasty Left 08/16/2014    Procedure: LEFT TOTAL KNEE ARTHROPLASTY;  Surgeon: Ollen Gross, MD;  [facility]: [facility];  Service: Orthopedics;  Laterality: Left;  . Joint replacement      There were no vitals filed for this visit.  Visit Diagnosis:  Stiffness of knee joint, left  Abnormality of gait  Pain in left knee  Swelling of limb  Generalized muscle weakness      Subjective Assessment - 10/04/14 1625    Subjective still having some stiffness   Limitations Walking;Standing;Lifting   How long can you sit comfortably? 20 min   Patient Stated Goals walk with a normal gait pattern and without AD and that she can  bend her knee.   Currently in Pain? Yes   Pain Score 3    Pain Location Knee   Pain Orientation Left   Pain Descriptors / Indicators Aching;Sore   Pain Type Surgical pain   Pain Onset More than a month ago   Pain Frequency Intermittent   Aggravating Factors  sitting; bending            [facility] PT Assessment - 10/04/14 1705    Observation/Other Assessments   Focus on Therapeutic Outcomes (FOTO)  58 (42% limited)                     [facility] Adult PT Treatment/Exercise - 10/04/14 1630    Knee/Hip Exercises: Aerobic   Stationary Bike bike x 8' seat slowly advanced up to 5   Tread Mill Nu Step x 6' L6   Knee/Hip Exercises: Machines for Strengthening   Cybex Knee Extension 5# x20 LLE only   Cybex Knee Flexion 10# x 20 LLE only   Cybex Leg Press 20# 2x10; pt unable to perform LLE only   Total Gym Leg Press no weight x 5 reps with overpressure for ROM   Modalities   Modalities Cryotherapy;Electrical Stimulation   Cryotherapy   Number Minutes Cryotherapy 15 Minutes   Cryotherapy Location Knee   Type of Cryotherapy Ice pack  Programme researcher, broadcasting/film/videolectrical Stimulation   Electrical Stimulation Location L knee   Electrical Stimulation Action IFC   Electrical Stimulation Parameters to tolerance (6 mHz) x 15 min   Electrical Stimulation Goals Pain                  PT Short Term Goals - 09/24/14 1048    PT SHORT TERM GOAL #1   Title I with initial HEP   Status Achieved   PT SHORT TERM GOAL #2   Title improve left knee flexion to 90 degrees   Status Achieved   PT SHORT TERM GOAL #3   Title able to perform a SLR on left   Status Achieved           PT Long Term Goals - 09/24/14 1047    PT LONG TERM GOAL #1   Title I with advanced HEP   Status On-going   PT LONG TERM GOAL #2   Title improve left knee flexion to 120 degrees   Status On-going   PT LONG TERM GOAL #3   Title able to ambulate without AD with normalized gait pattern   Status On-going   PT LONG TERM GOAL #4    Title demo 4+/5 LLE strength   Status On-going   PT LONG TERM GOAL #5   Title decreased pain by 50% with ADLS   Status On-going               Plan - 10/04/14 1657    Clinical Impression Statement Pt continues to demonstrate low tolerance to ROM and stretching.  Discussed JAS brace with pt and may need to discuss with MD.   PT Next Visit Plan continue to work on end range flex/extension.   Recommend JAS brace to MD on f/u; needs renewal/recert   Consulted and Agree with Plan of Care Patient        Problem List Patient Active Problem List   Diagnosis Date Noted  . OA (osteoarthritis) of knee 08/16/2014  . Postoperative anemia due to acute blood loss 11/12/2013  . OA (osteoarthritis) of hip 11/11/2013  . Palpitations 03/11/2012   Clarita CraneStephanie F Sorrel Cassetta, PT, DPT 10/04/2014 5:10 PM  [facility] [place] [facility] [address] [place], [place], [address] Phone: [phone]   Fax:  [phone]

## 2014-10-06 ENCOUNTER — Ambulatory Visit: Payer: Medicare Other | Admitting: Physical Therapy

## 2014-10-06 ENCOUNTER — Encounter: Payer: Self-pay | Admitting: Physical Therapy

## 2014-10-06 DIAGNOSIS — M25662 Stiffness of left knee, not elsewhere classified: Secondary | ICD-10-CM | POA: Diagnosis not present

## 2014-10-06 DIAGNOSIS — M25562 Pain in left knee: Secondary | ICD-10-CM

## 2014-10-06 NOTE — Therapy (Signed)
[facility] [address] [place], [place], [address] Phone: [phone]   Fax:  [phone]  Physical Therapy Treatment  Patient Details  Name: Kimberly Mccullough MRN: [id number] Date of Birth: 09-Mar-1946 Referring Provider:  Ollen GrossAluisio, Frank, MD  Encounter Date: 10/06/2014      PT End of Session - 10/06/14 1148    Visit Number 12   Number of Visits 12   Date for PT Re-Evaluation 10/05/14   PT Start Time 1107   PT Stop Time 1155   PT Time Calculation (min) 48 min      Past Medical History  Diagnosis Date  . Hyperlipidemia   . PTSD (post-traumatic stress disorder)   . No blood products   . Arthritis   . Rapid heart beat     takes metoprolol to treat / denies HBP  . Hypertension   . Osteopenia   . Tinnitus     Past Surgical History  Procedure Laterality Date  . Abdominal hysterectomy    . Cholecystectomy    . Bunionectomy      BILATERAL  . Total hip arthroplasty Left 11/11/2013    Procedure: LEFT TOTAL HIP ARTHROPLASTY ANTERIOR APPROACH;  Surgeon: Loanne DrillingFrank Aluisio V, MD;  Location: [facility];  Service: Orthopedics;  Laterality: Left;  . Total knee arthroplasty Left 08/16/2014    Procedure: LEFT TOTAL KNEE ARTHROPLASTY;  Surgeon: Ollen GrossFrank Aluisio, MD;  Location: [facility];  Service: Orthopedics;  Laterality: Left;  . Joint replacement      There were no vitals filed for this visit.  Visit Diagnosis:  Stiffness of knee joint, left  Pain in left knee      Subjective Assessment - 10/06/14 1117    Subjective i have been working hard at gym with ther ex, good and bad days. Amb in without AD with good gait.   Currently in Pain? No/denies            [facility] PT Assessment - 10/06/14 0001    AROM   AROM Assessment Site Knee   Right/Left Knee Left   Left Knee Flexion 100  max effort and much encouragement                     [facility] Adult PT Treatment/Exercise - 10/06/14 0001    Knee/Hip Exercises:  Aerobic   Elliptical I10 R3 x 3'   Tread Mill Nu Step x 6' L6   Knee/Hip Exercises: Machines for Strengthening   Cybex Knee Extension 5# x20 LLE only   Cybex Knee Flexion 10# x 20 LLE only   Cybex Leg Press 20# left only with assistance the independant eccentric lowering    Knee/Hip Exercises: Plyometrics   Other Plyometric Exercises 6 inch step up Left 15 times   Other Plyometric Exercises  4 inch step down 10 times   Knee/Hip Exercises: Standing   Other Standing Knee Exercises HS curl with knee on stool 10 times 2 sets   Manual Therapy   Manual Therapy Joint mobilization;Passive ROM   Passive ROM flexion   Kinesiotex Edema;Create Space  scar mob, Museum/gallery conservatorlantern tech                  PT Short Term Goals - 09/24/14 1048    PT SHORT TERM GOAL #1   Title I with initial HEP   Status Achieved   PT SHORT TERM GOAL #2   Title improve left knee flexion to 90 degrees  Status Achieved   PT SHORT TERM GOAL #3   Title able to perform a SLR on left   Status Achieved           PT Long Term Goals - 10/06/14 1150    PT LONG TERM GOAL #1   Title I with advanced HEP   Baseline at gym   Status Achieved   PT LONG TERM GOAL #2   Title improve left knee flexion to 120 degrees   Baseline 100 with max effort   Status On-going   PT LONG TERM GOAL #3   Title able to ambulate without AD with normalized gait pattern   Status Achieved   PT LONG TERM GOAL #4   Title demo 4+/5 LLE strength   Status On-going               Plan - 10/06/14 1149    Clinical Impression Statement pt will improved ROM with max effort ,functional improvements note dwith gait and ther ex and functional bend but still very limkited when measured and painful with MT. Quad and HS weakness.   PT Next Visit Plan assess use of tape, quad and HS strength esp. end range        Problem List Patient Active Problem List   Diagnosis Date Noted  . OA (osteoarthritis) of knee 08/16/2014  . Postoperative  anemia due to acute blood loss 11/12/2013  . OA (osteoarthritis) of hip 11/11/2013  . Palpitations 03/11/2012    PAYSEUR,ANGIE PTA 10/06/2014, 11:52 AM  [name] [place] [facility] [address] [place], [place], [address] Phone: [phone]   Fax:  [phone]

## 2014-10-08 ENCOUNTER — Ambulatory Visit: Payer: Medicare Other | Admitting: Rehabilitation

## 2014-10-08 DIAGNOSIS — M25562 Pain in left knee: Secondary | ICD-10-CM

## 2014-10-08 DIAGNOSIS — M25662 Stiffness of left knee, not elsewhere classified: Secondary | ICD-10-CM | POA: Diagnosis not present

## 2014-10-08 DIAGNOSIS — R269 Unspecified abnormalities of gait and mobility: Secondary | ICD-10-CM

## 2014-10-08 NOTE — Therapy (Signed)
[facility] [place] [address] [place], [place], [address] Phone: [phone]   Fax:  [phone]  Physical Therapy Treatment  Patient Details  Name: Kimberly Mccullough MRN: [id number] Date of Birth: 14-Feb-1946 Referring Provider:  Ollen Gross, MD  Encounter Date: 10/08/2014      PT End of Session - 10/08/14 0953    Visit Number 13   PT Start Time 0940   PT Stop Time 1050   PT Time Calculation (min) 70 min      Past Medical History  Diagnosis Date  . Hyperlipidemia   . PTSD (post-traumatic stress disorder)   . No blood products   . Arthritis   . Rapid heart beat     takes metoprolol to treat / denies HBP  . Hypertension   . Osteopenia   . Tinnitus     Past Surgical History  Procedure Laterality Date  . Abdominal hysterectomy    . Cholecystectomy    . Bunionectomy      BILATERAL  . Total hip arthroplasty Left 11/11/2013    Procedure: LEFT TOTAL HIP ARTHROPLASTY ANTERIOR APPROACH;  Surgeon: Loanne Drilling, MD;  [facility]: [facility];  Service: Orthopedics;  Laterality: Left;  . Total knee arthroplasty Left 08/16/2014    Procedure: LEFT TOTAL KNEE ARTHROPLASTY;  Surgeon: Ollen Gross, MD;  [facility]: [facility];  Service: Orthopedics;  Laterality: Left;  . Joint replacement      There were no vitals filed for this visit.  Visit Diagnosis:  Pain in left knee  Stiffness of knee joint, left  Abnormality of gait      Subjective Assessment - 10/08/14 1000    Subjective States kineseotape was "fine."    Feels a little more sore today, but overall doing well.     Went to gym yesterday and did bicycle.     Currently in Pain? Yes   Pain Score 3    Pain Location Knee   Pain Orientation Left   Pain Descriptors / Indicators Sore                         [facility] Adult PT Treatment/Exercise - 10/08/14 0001    Knee/Hip Exercises: Aerobic   Elliptical I10 R6 x 3'F/3"B   Tread Mill TM x 5' @ 2.68mph/1  incline   Knee/Hip Exercises: Machines for Strengthening   Cybex Knee Extension 5# x20   Cybex Knee Flexion 10# x 20   Cybex Leg Press 30# x 20 LLE only   Knee/Hip Exercises: Plyometrics   Other Plyometric Exercises 6" step up and across forward,  x 15 LLE   Other Plyometric Exercises 4" step over x 20   Knee/Hip Exercises: Supine   Other Supine Knee Exercises wall slide knee flexion stretch x 4'                PT Education - 10/08/14 1010    Education provided Yes   Education Details keeping L knee flexed for transfers and when sitting   Person(s) Educated Patient   Methods Explanation          PT Short Term Goals - 09/24/14 1048    PT SHORT TERM GOAL #1   Title I with initial HEP   Status Achieved   PT SHORT TERM GOAL #2   Title improve left knee flexion to 90 degrees   Status Achieved   PT SHORT TERM GOAL #3   Title  able to perform a SLR on left   Status Achieved           PT Long Term Goals - 10/06/14 1150    PT LONG TERM GOAL #1   Title I with advanced HEP   Baseline at gym   Status Achieved   PT LONG TERM GOAL #2   Title improve left knee flexion to 120 degrees   Baseline 100 with max effort   Status On-going   PT LONG TERM GOAL #3   Title able to ambulate without AD with normalized gait pattern   Status Achieved   PT LONG TERM GOAL #4   Title demo 4+/5 LLE strength   Status On-going               Problem List Patient Active Problem List   Diagnosis Date Noted  . OA (osteoarthritis) of knee 08/16/2014  . Postoperative anemia due to acute blood loss 11/12/2013  . OA (osteoarthritis) of hip 11/11/2013  . Palpitations 03/11/2012    Rumaysa Sabatino, PT 10/08/2014, 11:12 AM  [facility] [place] [facility] [address] [facility] [address] [facility] [address] [place], [place], [address] Phone: [phone]   Fax:  [phone]

## 2014-10-12 ENCOUNTER — Ambulatory Visit: Payer: Medicare Other | Admitting: Rehabilitation

## 2014-10-12 DIAGNOSIS — M25662 Stiffness of left knee, not elsewhere classified: Secondary | ICD-10-CM

## 2014-10-12 DIAGNOSIS — M25562 Pain in left knee: Secondary | ICD-10-CM

## 2014-10-12 NOTE — Therapy (Signed)
[facility] [place] [address] [place], [place], [address] Phone: [phone]   Fax:  [phone]  Physical Therapy Treatment  Patient Details  Name: Kimberly Mccullough MRN: [id number] Date of Birth: 1945/12/24 Referring Provider:  Ollen Gross, MD  Encounter Date: 10/12/2014      PT End of Session - 10/12/14 0857    Visit Number 14   PT Start Time 0850   PT Stop Time 0950   PT Time Calculation (min) 60 min      Past Medical History  Diagnosis Date  . Hyperlipidemia   . PTSD (post-traumatic stress disorder)   . No blood products   . Arthritis   . Rapid heart beat     takes metoprolol to treat / denies HBP  . Hypertension   . Osteopenia   . Tinnitus     Past Surgical History  Procedure Laterality Date  . Abdominal hysterectomy    . Cholecystectomy    . Bunionectomy      BILATERAL  . Total hip arthroplasty Left 11/11/2013    Procedure: LEFT TOTAL HIP ARTHROPLASTY ANTERIOR APPROACH;  Surgeon: Loanne Drilling, MD;  Location: [facility];  Service: Orthopedics;  Laterality: Left;  . Total knee arthroplasty Left 08/16/2014    Procedure: LEFT TOTAL KNEE ARTHROPLASTY;  Surgeon: Ollen Gross, MD;  Location: [facility];  Service: Orthopedics;  Laterality: Left;  . Joint replacement      There were no vitals filed for this visit.  Visit Diagnosis:  Stiffness of knee joint, left  Pain in left knee      Subjective Assessment - 10/12/14 0857    Subjective States feels better this week.   Currently in Pain? Yes   Pain Score 3    Pain Location Knee   Pain Orientation Left   Pain Descriptors / Indicators Sore                         [facility] Adult PT Treatment/Exercise - 10/12/14 0001    Bed Mobility   Bed Mobility --   Posture/Postural Control   Posture Comments Floor to chair transfer via crawling functional training with appropriate positioning on floor for wall slide stretch   Knee/Hip Exercises:  Aerobic   Elliptical I10 R6 3' F/3' B   Tread Mill TM x 5' @ 2.0   Knee/Hip Exercises: Machines for Strengthening   Cybex Knee Extension # x 20   Cybex Knee Flexion 10# x 20   Cybex Leg Press 30# x 20   Total Gym Leg Press LLE only all above   Knee/Hip Exercises: Plyometrics   Other Plyometric Exercises 6" step up and across x 15   Other Plyometric Exercises 6" side step up   Knee/Hip Exercises: Supine   Other Supine Knee Exercises wall slide flexion stretch   Cryotherapy   Number Minutes Cryotherapy 15 Minutes   Cryotherapy Location Knee   Type of Cryotherapy Ice pack                  PT Short Term Goals - 09/24/14 1048    PT SHORT TERM GOAL #1   Title I with initial HEP   Status Achieved   PT SHORT TERM GOAL #2   Title improve left knee flexion to 90 degrees   Status Achieved   PT SHORT TERM GOAL #3   Title able to perform a SLR on left   Status Achieved  PT Long Term Goals - 10/06/14 1150    PT LONG TERM GOAL #1   Title I with advanced HEP   Baseline at gym   Status Achieved   PT LONG TERM GOAL #2   Title improve left knee flexion to 120 degrees   Baseline 100 with max effort   Status On-going   PT LONG TERM GOAL #3   Title able to ambulate without AD with normalized gait pattern   Status Achieved   PT LONG TERM GOAL #4   Title demo 4+/5 LLE strength   Status On-going               Plan - 10/12/14 0858    Clinical Impression Statement Pt. is able to get 100 degrees L knee flexion consistently with supine wall slide stretch.     However, she can't get on the floor at home as she is too weak per her report.    Worked today on functional floor mobility and floor to chair t'fres.   Gait is still abnormal with velocity, step length, but steadily improving.     PT Next Visit Plan if pt reports still not comfortable getting onto floor for wall slide flexion stretching, then review floor transfers and floor mobility   PT Home Exercise Plan  continue with tape, wall slide stretching,        Problem List Patient Active Problem List   Diagnosis Date Noted  . OA (osteoarthritis) of knee 08/16/2014  . Postoperative anemia due to acute blood loss 11/12/2013  . OA (osteoarthritis) of hip 11/11/2013  . Palpitations 03/11/2012    Emera Bussie, PT 10/12/2014, 9:27 AM  [facility] [place] [address] [place], [place], [address] Phone: [phone]   Fax:  [phone]

## 2014-10-14 ENCOUNTER — Ambulatory Visit: Payer: Medicare Other | Admitting: Rehabilitation

## 2014-10-14 DIAGNOSIS — M25662 Stiffness of left knee, not elsewhere classified: Secondary | ICD-10-CM

## 2014-10-14 DIAGNOSIS — M25562 Pain in left knee: Secondary | ICD-10-CM

## 2014-10-14 NOTE — Therapy (Signed)
[facility] [place] [address] [place], [place], [address] Phone: [phone]   Fax:  [phone]  Physical Therapy Treatment  Patient Details  Name: Kimberly Mccullough MRN: [id number] Date of Birth: 07/25/1945 Referring Provider:  Ollen Gross, MD  Encounter Date: 10/14/2014      PT End of Session - 10/14/14 0809    Visit Number 15   PT Start Time 0807      Past Medical History  Diagnosis Date  . Hyperlipidemia   . PTSD (post-traumatic stress disorder)   . No blood products   . Arthritis   . Rapid heart beat     takes metoprolol to treat / denies HBP  . Hypertension   . Osteopenia   . Tinnitus     Past Surgical History  Procedure Laterality Date  . Abdominal hysterectomy    . Cholecystectomy    . Bunionectomy      BILATERAL  . Total hip arthroplasty Left 11/11/2013    Procedure: LEFT TOTAL HIP ARTHROPLASTY ANTERIOR APPROACH;  Surgeon: Loanne Drilling, MD;  [facility];  Service: Orthopedics;  Laterality: Left;  . Total knee arthroplasty Left 08/16/2014    Procedure: LEFT TOTAL KNEE ARTHROPLASTY;  Surgeon: Ollen Gross, MD;  [facility]: [facility];  Service: Orthopedics;  Laterality: Left;  . Joint replacement      There were no vitals filed for this visit.  Visit Diagnosis:  Stiffness of knee joint, left  Pain in left knee      Subjective Assessment - 10/14/14 0810    Subjective States doing ok today.   Took pain meds prior to PT (tramadol and robaxin) so is less painful today.    Advised her to be weaning off of these meds as much as possible.   Currently in Pain? Yes   Pain Score 2    Pain Location Knee   Pain Orientation Left   Pain Descriptors / Indicators Aching;Sore            [facility] PT Assessment - 10/14/14 0001    AROM   AROM Assessment Site Knee   Right/Left Knee Left   PROM   PROM Assessment Site Knee   Right/Left Knee Left   Left Knee Extension -4   Left Knee Flexion 100   Strength   Left Knee Flexion 4/5   Left Knee Extension 4/5                     [facility] Adult PT Treatment/Exercise - 10/14/14 0001    Knee/Hip Exercises: Aerobic   Elliptical I10 R6 x 3' F/3' B   Tread Mill TM x 7' @ 2.0   Knee/Hip Exercises: Machines for Strengthening   Cybex Knee Extension 5# x 30   Cybex Knee Flexion 10# x 30   Cybex Leg Press 40# x 30   Total Gym Leg Press LLE only   Knee/Hip Exercises: Plyometrics   Other Plyometric Exercises 6" step up and across forward;     side step up/across x 15 ea   Knee/Hip Exercises: Supine   Other Supine Knee Exercises wall slide flexion stretch x 4-5'   Cryotherapy   Number Minutes Cryotherapy 15 Minutes   Cryotherapy Location Knee   Type of Cryotherapy Ice pack                  PT Short Term Goals - 09/24/14 1048    PT SHORT TERM GOAL #1  Title I with initial HEP   Status Achieved   PT SHORT TERM GOAL #2   Title improve left knee flexion to 90 degrees   Status Achieved   PT SHORT TERM GOAL #3   Title able to perform a SLR on left   Status Achieved           PT Long Term Goals - 10/06/14 1150    PT LONG TERM GOAL #1   Title I with advanced HEP   Baseline at gym   Status Achieved   PT LONG TERM GOAL #2   Title improve left knee flexion to 120 degrees   Baseline 100 with max effort   Status On-going   PT LONG TERM GOAL #3   Title able to ambulate without AD with normalized gait pattern   Status Achieved   PT LONG TERM GOAL #4   Title demo 4+/5 LLE strength   Status On-going               Plan - 10/14/14 0814    Clinical Impression Statement Pt.  reports was able to get on floor for wall slide flexion stretch and transfer back to stand without difficulty.  Still has weak L knee flexion and extension at 4/5.     Needs continued work on this as well as gait speed and quality   PT Next Visit Plan Continue to work on L knee strength, gait speed and quality.    Try to improve ROM as  much as possible     Patient has been seen x 15 visits over the last 1 month following L TKA.     She is making good overall progress, albeit slowly.     Gait has improved and she is no longer using a device.    She still has a slow, guarded gait with decreased step length/stance time LLE.     Strength has improved to 4/5 L knee.    ROM has been the slowest and is -3 to 100 degrees.    We would generally recommend a JAS static progressive stretch brace for the ROM, but I don't think the patient would push flexion enough to improve ROM.   She has a low pain tolerance.      The knee remains moderately edematous as well, and she is advised to continue with ice program.    Plan to continue PT 2-4 more weeks with transition to independent gym program.        Problem List Patient Active Problem List   Diagnosis Date Noted  . OA (osteoarthritis) of knee 08/16/2014  . Postoperative anemia due to acute blood loss 11/12/2013  . OA (osteoarthritis) of hip 11/11/2013  . Palpitations 03/11/2012    Symphany Fleissner, PT 10/14/2014, 8:47 AM  [facility] [place] [address] [place], [place], [address] Phone: [phone]   Fax:  [phone]

## 2014-10-18 ENCOUNTER — Ambulatory Visit: Payer: Medicare Other | Admitting: Physical Therapy

## 2014-10-18 DIAGNOSIS — M7989 Other specified soft tissue disorders: Secondary | ICD-10-CM

## 2014-10-18 DIAGNOSIS — M6281 Muscle weakness (generalized): Secondary | ICD-10-CM

## 2014-10-18 DIAGNOSIS — M25662 Stiffness of left knee, not elsewhere classified: Secondary | ICD-10-CM

## 2014-10-18 DIAGNOSIS — R269 Unspecified abnormalities of gait and mobility: Secondary | ICD-10-CM

## 2014-10-18 DIAGNOSIS — M25562 Pain in left knee: Secondary | ICD-10-CM

## 2014-10-18 NOTE — Therapy (Signed)
[facility] [place] [address] [place], [place], [address] Phone: [phone]   Fax:  [phone]  Physical Therapy Treatment  Patient Details  Name: Kimberly Mccullough MRN: [id number] Date of Birth: Jul 10, 1945 Referring Provider:  Ollen Gross, MD  Encounter Date: 10/18/2014      PT End of Session - 10/18/14 1109    Visit Number 16   Number of Visits 12   Date for PT Re-Evaluation 11/05/14   PT Start Time 1103   PT Stop Time 1159   PT Time Calculation (min) 56 min   Activity Tolerance Patient tolerated treatment well;Patient limited by pain   Behavior During Therapy [facility] for tasks assessed/performed      Past Medical History  Diagnosis Date  . Hyperlipidemia   . PTSD (post-traumatic stress disorder)   . No blood products   . Arthritis   . Rapid heart beat     takes metoprolol to treat / denies HBP  . Hypertension   . Osteopenia   . Tinnitus     Past Surgical History  Procedure Laterality Date  . Abdominal hysterectomy    . Cholecystectomy    . Bunionectomy      BILATERAL  . Total hip arthroplasty Left 11/11/2013    Procedure: LEFT TOTAL HIP ARTHROPLASTY ANTERIOR APPROACH;  Surgeon: Loanne Drilling, MD;  [facility];  Service: Orthopedics;  Laterality: Left;  . Total knee arthroplasty Left 08/16/2014    Procedure: LEFT TOTAL KNEE ARTHROPLASTY;  Surgeon: Ollen Gross, MD;  Location: [facility];  Service: Orthopedics;  Laterality: Left;  . Joint replacement      There were no vitals filed for this visit.  Visit Diagnosis:  Stiffness of knee joint, left  Pain in left knee  Abnormality of gait  Swelling of limb  Generalized muscle weakness      Subjective Assessment - 10/18/14 1110    Subjective doing ok today.  did not take meds prior to PT today as she is trying to wean herself off meds.   Limitations Walking;Standing;Lifting   How long can you sit comfortably? 20 min   Patient Stated Goals  walk with a normal gait pattern and without AD and that she can bend her knee.   Currently in Pain? Yes   Pain Score 3    Pain Location Knee   Pain Orientation Left   Pain Descriptors / Indicators Aching   Pain Onset More than a month ago   Aggravating Factors  sitting and bending   Pain Relieving Factors standing up and sitting down   Multiple Pain Sites No                         [facility] Adult PT Treatment/Exercise - 10/18/14 0001    Knee/Hip Exercises: Aerobic   Elliptical 10R6 71f/3b    Knee/Hip Exercises: Machines for Strengthening   Cybex Knee Extension 5# X30 eccentrically   Cybex Knee Flexion 15#X30   Cybex Leg Press 40# both X 15  20# Left eccentric   Knee/Hip Exercises: Plyometrics   Other Plyometric Exercises 8" step overs   Modalities   Modalities Cryotherapy   Cryotherapy   Number Minutes Cryotherapy 15 Minutes   Cryotherapy Location Knee   Type of Cryotherapy Ice pack   Manual Therapy   Manual Therapy Passive ROM                  PT Short  Term Goals - 09/24/14 1048    PT SHORT TERM GOAL #1   Title I with initial HEP   Status Achieved   PT SHORT TERM GOAL #2   Title improve left knee flexion to 90 degrees   Status Achieved   PT SHORT TERM GOAL #3   Title able to perform a SLR on left   Status Achieved           PT Long Term Goals - 10/06/14 1150    PT LONG TERM GOAL #1   Title I with advanced HEP   Baseline at gym   Status Achieved   PT LONG TERM GOAL #2   Title improve left knee flexion to 120 degrees   Baseline 100 with max effort   Status On-going   PT LONG TERM GOAL #3   Title able to ambulate without AD with normalized gait pattern   Status Achieved   PT LONG TERM GOAL #4   Title demo 4+/5 LLE strength   Status On-going               Plan - 10/18/14 1141    Clinical Impression Statement patinet a little apprehensive with all ther ex. She had difficulty with increasing resistance with cybex extension.     Pt will benefit from skilled therapeutic intervention in order to improve on the following deficits Abnormal gait;Decreased range of motion;Increased edema;Decreased activity tolerance;Decreased scar mobility;Pain;Decreased mobility;Decreased strength   PT Frequency 3x / week   PT Duration 4 weeks   PT Next Visit Plan Assess next visit. MD apt on Thursday. Cont. ROM   PT Home Exercise Plan did not tape today. Cont next visit.    Consulted and Agree with Plan of Care Patient        Problem List Patient Active Problem List   Diagnosis Date Noted  . OA (osteoarthritis) of knee 08/16/2014  . Postoperative anemia due to acute blood loss 11/12/2013  . OA (osteoarthritis) of hip 11/11/2013  . Palpitations 03/11/2012     Lady Deutscher, PTA  10/18/2014, 11:55 AM  [facility] [place] [place] [address] [place], [place], [address] Phone: [phone]   Fax:  [phone]

## 2014-10-20 ENCOUNTER — Ambulatory Visit: Payer: Medicare Other | Admitting: Physical Therapy

## 2014-10-20 DIAGNOSIS — M25662 Stiffness of left knee, not elsewhere classified: Secondary | ICD-10-CM | POA: Diagnosis not present

## 2014-10-20 DIAGNOSIS — R269 Unspecified abnormalities of gait and mobility: Secondary | ICD-10-CM

## 2014-10-20 DIAGNOSIS — M6281 Muscle weakness (generalized): Secondary | ICD-10-CM

## 2014-10-20 DIAGNOSIS — M25562 Pain in left knee: Secondary | ICD-10-CM

## 2014-10-20 DIAGNOSIS — M7989 Other specified soft tissue disorders: Secondary | ICD-10-CM

## 2014-10-20 NOTE — Therapy (Signed)
[facility] [place] [address] [place], [place], [address] Phone: [phone]   Fax:  [phone]  Physical Therapy Treatment  Patient Details  Name: Kimberly Mccullough MRN: [id number] Date of Birth: 09-Feb-1946 Referring Provider:  Ollen Gross, MD  Encounter Date: 10/20/2014      PT End of Session - 10/20/14 1448    Visit Number 17   Date for PT Re-Evaluation 11/05/14   PT Start Time 1315   PT Stop Time 1409   PT Time Calculation (min) 54 min   Activity Tolerance Patient tolerated treatment well;Patient limited by pain   Behavior During Therapy [facility] for tasks assessed/performed      Past Medical History  Diagnosis Date  . Hyperlipidemia   . PTSD (post-traumatic stress disorder)   . No blood products   . Arthritis   . Rapid heart beat     takes metoprolol to treat / denies HBP  . Hypertension   . Osteopenia   . Tinnitus     Past Surgical History  Procedure Laterality Date  . Abdominal hysterectomy    . Cholecystectomy    . Bunionectomy      BILATERAL  . Total hip arthroplasty Left 11/11/2013    Procedure: LEFT TOTAL HIP ARTHROPLASTY ANTERIOR APPROACH;  Surgeon: Loanne Drilling, MD;  [facility]: [facility];  Service: Orthopedics;  Laterality: Left;  . Total knee arthroplasty Left 08/16/2014    Procedure: LEFT TOTAL KNEE ARTHROPLASTY;  Surgeon: Ollen Gross, MD;  [facility]: [facility];  Service: Orthopedics;  Laterality: Left;  . Joint replacement      There were no vitals filed for this visit.  Visit Diagnosis:  Stiffness of knee joint, left  Pain in left knee  Abnormality of gait  Swelling of limb  Generalized muscle weakness      Subjective Assessment - 10/20/14 1320    Subjective doing well today; concerned that knee isn't bending when she's walking.   Limitations Walking;Standing;Lifting   How long can you walk comfortably? 30 min   Patient Stated Goals walk with a normal gait pattern and without AD  and that she can bend her knee.   Currently in Pain? Yes   Pain Score 3    Pain Location Knee   Pain Orientation Left   Pain Descriptors / Indicators Aching   Pain Type Surgical pain   Pain Onset More than a month ago   Pain Frequency Intermittent   Aggravating Factors  sitting and bending   Pain Relieving Factors standing up and sitting down            [facility] PT Assessment - 10/20/14 0001    AROM   AROM Assessment Site Knee   Right/Left Knee Left   Left Knee Extension 2   Left Knee Flexion 94   PROM   PROM Assessment Site Knee   Right/Left Knee Left   Left Knee Extension 2   Left Knee Flexion 100                     [facility] Adult PT Treatment/Exercise - 10/20/14 1321    Knee/Hip Exercises: Aerobic   Stationary Bike NuStep Level 6 x 6 min   Elliptical 10R6 44f/3b   noticable fatigue after elliptical   Knee/Hip Exercises: Machines for Strengthening   Cybex Knee Extension 5# X30 eccentrically   Cybex Knee Flexion 15#X30   Modalities   Modalities Cryotherapy   Cryotherapy   Number Minutes  Cryotherapy 15 Minutes   Cryotherapy Location Knee   Type of Cryotherapy Ice pack   Manual Therapy   Manual Therapy Passive ROM   Passive ROM flexion and extension                  PT Short Term Goals - 09/24/14 1048    PT SHORT TERM GOAL #1   Title I with initial HEP   Status Achieved   PT SHORT TERM GOAL #2   Title improve left knee flexion to 90 degrees   Status Achieved   PT SHORT TERM GOAL #3   Title able to perform a SLR on left   Status Achieved           PT Long Term Goals - 10/20/14 1451    PT LONG TERM GOAL #1   Title I with advanced HEP   Status Achieved   PT LONG TERM GOAL #2   Title improve left knee flexion to 120 degrees   Status On-going   PT LONG TERM GOAL #3   Title able to ambulate without AD with normalized gait pattern   Status Achieved   PT LONG TERM GOAL #4   Title demo 4+/5 LLE strength   Status On-going   PT LONG  TERM GOAL #5   Title decreased pain by 50% with ADLS   Status On-going   PT LONG TERM GOAL #6   Title improve FOTO to 44%   Status On-going               Plan - 10/20/14 1449    Clinical Impression Statement Pt continues to demonstrate decreased ROM 2-100 passive and 4-94 active.  Pt with limited tolerance to manual therapy for ROM due to pain.  Continues to demonstrate decreased strength in LLE, especially eccentric quad control.  Will continue to benefit from PT to maximize function.   PT Next Visit Plan cont ROM and strengthening   Consulted and Agree with Plan of Care Patient        Problem List Patient Active Problem List   Diagnosis Date Noted  . OA (osteoarthritis) of knee 08/16/2014  . Postoperative anemia due to acute blood loss 11/12/2013  . OA (osteoarthritis) of hip 11/11/2013  . Palpitations 03/11/2012   Clarita Crane, PT, DPT 10/20/2014 3:17 PM  [facility] [address] [place] [address] [place], [place], [address] Phone: [phone]   Fax:  [phone]

## 2014-10-21 ENCOUNTER — Ambulatory Visit: Payer: Medicare Other | Admitting: Physical Therapy

## 2014-10-26 ENCOUNTER — Encounter: Payer: Self-pay | Admitting: Physical Therapy

## 2014-10-26 ENCOUNTER — Ambulatory Visit: Payer: Medicare Other | Admitting: Physical Therapy

## 2014-10-26 DIAGNOSIS — M25662 Stiffness of left knee, not elsewhere classified: Secondary | ICD-10-CM | POA: Diagnosis not present

## 2014-10-26 DIAGNOSIS — M25562 Pain in left knee: Secondary | ICD-10-CM

## 2014-10-26 NOTE — Therapy (Signed)
[facility] [place] [address] [place], [place], [address] Phone: [phone]   Fax:  [phone]  Physical Therapy Treatment  Patient Details  Name: Kimberly Mccullough MRN: [id number] Date of Birth: [birth year] Referring Provider:  Ollen GrossAluisio, Frank, MD  Encounter Date: 10/26/2014      PT End of Session - 10/26/14 1306    Visit Number 18   Date for PT Re-Evaluation 11/05/14   PT Start Time 1230   PT Stop Time 1315   PT Time Calculation (min) 45 min      Past Medical History  Diagnosis Date  . Hyperlipidemia   . PTSD (post-traumatic stress disorder)   . No blood products   . Arthritis   . Rapid heart beat     takes metoprolol to treat / denies HBP  . Hypertension   . Osteopenia   . Tinnitus     Past Surgical History  Procedure Laterality Date  . Abdominal hysterectomy    . Cholecystectomy    . Bunionectomy      BILATERAL  . Total hip arthroplasty Left 11/11/2013    Procedure: LEFT TOTAL HIP ARTHROPLASTY ANTERIOR APPROACH;  Surgeon: Loanne DrillingFrank Aluisio V, MD;  [facility]: [facility];  Service: Orthopedics;  Laterality: Left;  . Total knee arthroplasty Left 08/16/2014    Procedure: LEFT TOTAL KNEE ARTHROPLASTY;  Surgeon: Ollen GrossFrank Aluisio, MD;  Location: [facility];  Service: Orthopedics;  Laterality: Left;  . Joint replacement      There were no vitals filed for this visit.  Visit Diagnosis:  Stiffness of knee joint, left  Pain in left knee      Subjective Assessment - 10/26/14 1239    Subjective saw MD this morning, he measured 105 flexion,very pleased and okay to D/C PT this week   Currently in Pain? No/denies                         [facility] - 10/26/14 0001    Ambulation/Gait   Stairs Yes   Stairs Assistance 6: Modified independent (Device/Increase time)   Stair Management Technique One rail Right;Alternating pattern   Number of Stairs 12   Knee/Hip Exercises: Aerobic   Stationary Bike NuStep Level 6 x 8 min   Elliptical 10R6 5974f/3b   noticable fatigue after elliptical   Knee/Hip Exercises: Machines for Strengthening   Cybex Knee Extension 5#  3 sets 10  left only   Cybex Knee Flexion 15# 3 sets 10  Left only   Cybex Leg Press 40# both X 15  20# left only 15 times   Knee/Hip Exercises: Plyometrics   Other Plyometric Exercises 8" step overs  2 sets 10                  PT Short Term Goals - 09/24/14 1048    PT SHORT TERM GOAL #1   Title I with initial HEP   Status Achieved   PT SHORT TERM GOAL #2   Title improve left knee flexion to 90 degrees   Status Achieved   PT SHORT TERM GOAL #3   Title able to perform a SLR on left   Status Achieved           PT Long Term Goals - 10/26/14 1308    PT LONG TERM GOAL #1   Title I with advanced HEP   Status Achieved   PT LONG TERM GOAL #2  Title improve left knee flexion to 120 degrees   Status On-going   PT LONG TERM GOAL #3   Title able to ambulate without AD with normalized gait pattern   Status Achieved   PT LONG TERM GOAL #4   Title demo 4+/5 LLE strength   Status Achieved   PT LONG TERM GOAL #5   Title decreased pain by 50% with ADLS   Status Achieved               Plan - 10/26/14 1307    Clinical Impression Statement pt with improved gait and ability to do steps, pt independant at gym. ROM still limited but pt and MD very pleased   PT Next Visit Plan DISCHARGE        Problem List Patient Active Problem List   Diagnosis Date Noted  . OA (osteoarthritis) of knee 08/16/2014  . Postoperative anemia due to acute blood loss 11/12/2013  . OA (osteoarthritis) of hip 11/11/2013  . Palpitations 03/11/2012    Dannya Pitkin,ANGIE PTA 10/26/2014, 1:09 PM  [facility] [place] [address] [place], [place], [address] Phone: [phone]   Fax:  [phone]

## 2014-10-28 ENCOUNTER — Ambulatory Visit: Payer: Medicare Other | Admitting: Physical Therapy

## 2014-10-28 ENCOUNTER — Encounter: Payer: Self-pay | Admitting: Physical Therapy

## 2014-10-28 DIAGNOSIS — M6281 Muscle weakness (generalized): Secondary | ICD-10-CM

## 2014-10-28 DIAGNOSIS — M25662 Stiffness of left knee, not elsewhere classified: Secondary | ICD-10-CM | POA: Diagnosis not present

## 2014-10-28 NOTE — Therapy (Signed)
[address] [place], [place], [address] Phone: [phone]   Fax:  [phone]  Physical Therapy Treatment  Patient Details  Name: Kimberly Mccullough MRN: [id number] Date of Birth: 13-Nov-1945 Referring Provider:  Gaynelle Arabian, MD  Encounter Date: 10/28/2014      PT End of Session - 10/28/14 1109    Visit Number 19   Date for PT Re-Evaluation 11/05/14   PT Start Time 1110   PT Stop Time 1150   PT Time Calculation (min) 40 min      Past Medical History  Diagnosis Date  . Hyperlipidemia   . PTSD (post-traumatic stress disorder)   . No blood products   . Arthritis   . Rapid heart beat     takes metoprolol to treat / denies HBP  . Hypertension   . Osteopenia   . Tinnitus     Past Surgical History  Procedure Laterality Date  . Abdominal hysterectomy    . Cholecystectomy    . Bunionectomy      BILATERAL  . Total hip arthroplasty Left 11/11/2013    Procedure: LEFT TOTAL HIP ARTHROPLASTY ANTERIOR APPROACH;  Surgeon: Gearlean Alf, MD;  Location: [facility];  Service: Orthopedics;  Laterality: Left;  . Total knee arthroplasty Left 08/16/2014    Procedure: LEFT TOTAL KNEE ARTHROPLASTY;  Surgeon: Gaynelle Arabian, MD;  Location: [facility];  Service: Orthopedics;  Laterality: Left;  . Joint replacement      There were no vitals filed for this visit.  Visit Diagnosis:  Generalized muscle weakness      Subjective Assessment - 10/28/14 1108    Subjective i feel good about D/C and okay with ex at gym   Currently in Pain? No/denies                         [facility] Adult PT Treatment/Exercise - 10/28/14 0001    Knee/Hip Exercises: Aerobic   Stationary Bike NuStep Level 6 x 8 min   Elliptical 10R6 [id number]   noticable fatigue after elliptical   Knee/Hip Exercises: Machines for Strengthening   Cybex Knee Extension 5#  3 sets 10  left only   Cybex Knee Flexion 15# 3 sets 10  Left only   Cybex Leg Press 40# both X 15  20# left only 15 times   Total Gym Leg Press LLE only   Knee/Hip Exercises: Plyometrics   Other Plyometric Exercises 8" step overs  2 sets 10                PT Education - 10/28/14 1108    Education provided Yes   Education Details gym safety adn progression   Person(s) Educated Patient   Methods Explanation   Comprehension Verbalized understanding          PT Short Term Goals - 10/28/14 1109    PT SHORT TERM GOAL #1   Title I with initial HEP   Status Achieved   PT SHORT TERM GOAL #2   Title improve left knee flexion to 90 degrees   Status Achieved   PT SHORT TERM GOAL #3   Title able to perform a SLR on left   Status Achieved           PT Long Term Goals - 10/28/14 1109    PT LONG TERM GOAL #1   Title I with advanced HEP   Status Achieved   PT LONG  TERM GOAL #2   Title improve left knee flexion to 120 degrees   Status On-going   PT LONG TERM GOAL #3   Title able to ambulate without AD with normalized gait pattern   Status Achieved   PT LONG TERM GOAL #4   Title demo 4+/5 LLE strength   Status Achieved   PT LONG TERM GOAL #5   Title decreased pain by 50% with ADLS   Status Achieved   PT LONG TERM GOAL #6   Title improve FOTO to 44%   Status Achieved               Plan - 10/28/14 1111    Clinical Impression Statement goals met-discharge   PT Next Visit Plan DISCHARGE        Problem List Patient Active Problem List   Diagnosis Date Noted  . OA (osteoarthritis) of knee 08/16/2014  . Postoperative anemia due to acute blood loss 11/12/2013  . OA (osteoarthritis) of hip 11/11/2013  . Palpitations 03/11/2012    Jarl Sellitto,ANGIE PTA 10/28/2014, 11:33 AM  [address] [place], [place], [address] Phone: [phone]   Fax:  [phone]

## 2014-10-28 NOTE — Therapy (Signed)
[address] [place], [place], [address] Phone: [phone]   Fax:  [phone]  October 28, 2014   @CCLISTADDRESS @  Physical Therapy Discharge Summary  Patient: Kimberly Mccullough  MRN: [id number]  Date of Birth: 1946-01-13   Diagnosis: Generalized muscle weakness Referring Provider:  Gaynelle Arabian, MD  The above patient had been seen in Physical Therapy   19 times   The patient is: Improved  Subjective: I am pleased    Functional Status at Discharge: Ambulating all distances without assistive device, AROM past 100 degrees flexion Goals were met      Plan - 10/28/14 1111    Clinical Impression Statement goals met-discharge   PT Next Visit Plan DISCHARGE      Sincerely,   Sumner Boast, PT   CC @[email or web address] @  [place], [place], [address] Phone: [phone]   Fax:  [phone]

## 2014-10-29 ENCOUNTER — Telehealth: Payer: Self-pay | Admitting: Nurse Practitioner

## 2014-10-29 NOTE — Telephone Encounter (Signed)
I spoke with pt & she has orders for a CBCD & TSH. She is aware she does not need to be fasting. She has appointment July 5th with Norma FredricksonLori Gerhardt NP  Mylo Redebbie Alixandrea Milleson RN

## 2014-10-29 NOTE — Telephone Encounter (Signed)
New message    Pt had information stating lab work would be done at appt on July 5.  Pt wants to verify she needs to fast for possible lab work. Please advise

## 2014-11-02 ENCOUNTER — Ambulatory Visit (INDEPENDENT_AMBULATORY_CARE_PROVIDER_SITE_OTHER): Payer: Medicare Other | Admitting: Nurse Practitioner

## 2014-11-02 ENCOUNTER — Encounter: Payer: Self-pay | Admitting: Nurse Practitioner

## 2014-11-02 VITALS — BP 122/76 | HR 62 | Resp 18 | Ht 70.0 in | Wt 176.0 lb

## 2014-11-02 DIAGNOSIS — R5382 Chronic fatigue, unspecified: Secondary | ICD-10-CM | POA: Diagnosis not present

## 2014-11-02 DIAGNOSIS — I10 Essential (primary) hypertension: Secondary | ICD-10-CM

## 2014-11-02 DIAGNOSIS — R002 Palpitations: Secondary | ICD-10-CM | POA: Diagnosis not present

## 2014-11-02 DIAGNOSIS — F419 Anxiety disorder, unspecified: Secondary | ICD-10-CM | POA: Diagnosis not present

## 2014-11-02 DIAGNOSIS — F4310 Post-traumatic stress disorder, unspecified: Secondary | ICD-10-CM

## 2014-11-02 NOTE — Patient Instructions (Addendum)
We will be checking the following labs today - NONE   Medication Instructions:    Continue with your current medicines.     Testing/Procedures To Be Arranged:  N/A  Follow-Up:   See me in one year    Other Special Instructions:   N/A  Call the [facility] office at [phone] if you have any questions, problems or concerns.      

## 2014-11-02 NOTE — Progress Notes (Signed)
CARDIOLOGY OFFICE NOTE  Date:  11/02/2014    Kimberly Mccullough Date of Birth: 11-28-1945 Medical Record #[id number]  PCP:  Kimberly Nine, MD  Cardiologist:  [facility]    Chief Complaint  Patient presents with  . Hypertension    Follow up visit - seen for Dr. Clifton Mccullough    History of Present Illness: Kimberly Mccullough is a 69 y.o. female who presents today for a follow up visit. Seen for Dr. Clifton Mccullough. She has HTN, HLD, PTSD and palpitations. Has had lots of stress/anxiety/guilt associated with death of her husband. She is a Jehovah Witness.   I saw her back in the summer of 2015 with palpitations - I advised her to cut back on her caffeine (was drinking up to 7 cups of coffee per day). Stress echo from 2014 looked ok at that time except for LVH. She has been hesitant to try statin therapy.   Was at the Urgent Care thru [facility] back in October of 2015 - was complaining of shortness of breath. Seemed nervous. Advised to go to the ER. EKG negative. Most of her issues centered around her anxiety. She had a GXT in November of 2015- negative for ischemia but had poor exercise tolerance and BP was up - she had not taken her medicine for that study. Recommended to see Behavioral Health for counseling but she has been very resistant to see anyone.   I last saw her in January. Continued to have issues that seem to center around anxiety/PTSD. Was getting ready to have TKR.   Comes back today. Here alone. Gets a little tired - but she is "working out". She has had her knee surgery and that went well. No chest pain. Not short of breath. Says she feels good.   Past Medical History  Diagnosis Date  . Hyperlipidemia   . PTSD (post-traumatic stress disorder)   . No blood products   . Arthritis   . Rapid heart beat     takes metoprolol to treat / denies HBP  . Hypertension   . Osteopenia   . Tinnitus     Past Surgical History  Procedure Laterality Date  . Abdominal hysterectomy      . Cholecystectomy    . Bunionectomy      BILATERAL  . Total hip arthroplasty Left 11/11/2013    Procedure: LEFT TOTAL HIP ARTHROPLASTY ANTERIOR APPROACH;  Surgeon: Loanne Drilling, MD;  Location: [facility];  Service: Orthopedics;  Laterality: Left;  . Total knee arthroplasty Left 08/16/2014    Procedure: LEFT TOTAL KNEE ARTHROPLASTY;  Surgeon: Ollen Gross, MD;  Location: [facility];  Service: Orthopedics;  Laterality: Left;  . Joint replacement       Medications: Current Outpatient Prescriptions  Medication Sig Dispense Refill  . meloxicam (MOBIC) 15 MG tablet Take 15 mg by mouth daily.    . metoprolol tartrate (LOPRESSOR) 25 MG tablet Take 1 tablet (25 mg total) by mouth 2 (two) times daily. 14 tablet 0  . traMADol (ULTRAM) 50 MG tablet Take 1-2 tablets (50-100 mg total) by mouth every 6 (six) hours as needed (mild to moderate pain). 80 tablet 1   No current facility-administered medications for this visit.    Allergies: Allergies  Allergen Reactions  . Other     Pt is Jehovas Witness and does not want any blood products    Social History: The patient  reports that she has never smoked. She does not have any smokeless tobacco history  on file. She reports that she does not drink alcohol or use illicit drugs.   Family History: The patient's family history includes Dementia in her mother; Liver disease in her father. There is no history of CAD.   Review of Systems: Please see the history of present illness.   Otherwise, the review of systems is positive for none.   All other systems are reviewed and negative.   Physical Exam: VS:  BP 122/76 mmHg  Pulse 62  Resp 18  Ht 5\' 10"  (1.778 m)  Wt 176 lb (79.833 kg)  BMI 25.25 kg/m2  SpO2 99% .  BMI Body mass index is 25.25 kg/(m^2).  Wt Readings from Last 3 Encounters:  11/02/14 176 lb (79.833 kg)  08/16/14 182 lb (82.555 kg)  08/05/14 182 lb (82.555 kg)    General: Pleasant. Well developed, well nourished and in no acute  distress. She has lost 6 pounds. She actually looks like she feels better today.  HEENT: Normal. Neck: Supple, no JVD, carotid bruits, or masses noted.  Cardiac: Regular rate and rhythm. No murmurs, rubs, or gallops. No edema.  Respiratory:  Lungs are clear to auscultation bilaterally with normal work of breathing.  GI: Soft and nontender.  MS: No deformity or atrophy. Gait and ROM intact. Skin: Warm and dry. Color is normal.  Neuro:  Strength and sensation are intact and no gross focal deficits noted.  Psych: Alert, appropriate and with normal affect.   LABORATORY DATA:  EKG:  EKG is not ordered today.   Lab Results  Component Value Date   WBC 7.6 08/19/2014   HGB 9.5* 08/19/2014   HCT 28.4* 08/19/2014   PLT 170 08/19/2014   GLUCOSE 110* 08/18/2014   CHOL 222* 11/14/2012   TRIG 61.0 11/14/2012   HDL 53.70 11/14/2012   LDLDIRECT 167.1 11/14/2012   ALT 16 08/05/2014   AST 24 08/05/2014   NA 138 08/18/2014   K 4.0 08/18/2014   CL 106 08/18/2014   CREATININE 0.77 08/18/2014   BUN 19 08/18/2014   CO2 25 08/18/2014   TSH 1.63 10/29/2013   INR 0.96 08/05/2014    BNP (last 3 results) No results for input(s): BNP in the last 8760 hours.  ProBNP (last 3 results)  Recent Labs  02/16/14 1131  PROBNP 39.0     Other Studies Reviewed Today:  GXT from November 2015 Patient presents today for routine GXT. Has had HTN, HLD, PTSD and palpitations. Negative stress echo from summer of 2014. Has had issues with anxiety, dyspnea, and chest heaviness.   Today the patient exercised on the standard Bruce protocol for a total of 3 minutes.  Reduced exercise tolerance.  Hypertensive blood pressure response - She did not take her BP medicines today.  Clinically negative for chest pain. Test was stopped due to BP response.  EKG negative for ischemia. No significant arrhythmia noted except for a rare PVC and a short run of PAT.   Recommendations: Continue with beta blocker -  needs to take her dose for today  See back as planned  Still encouraged to seek counseling - advised to call Behavioral Health with [facility].   Stress Echo Study Conclusions from June 2014  - Stress ECG conclusions: There were no stress arrhythmias or conduction abnormalities. The stress ECG was negative for ischemia. - Staged echo: There was no echocardiographic evidence for stress-induced ischemia. - Impressions: Moderate LVH with septal thickness 14.5 mm Impressions:  - Moderate LVH with septal thickness 14.5 mm  ------------------------------------------------------------  ASSESSMENT AND PLAN: 1. HTN - great control on her current regimen.  2. Anxiety/stress/PTSD - does not seem to be an issue today.   Current medicines are reviewed with the patient today.  The patient does not have concerns regarding medicines other than what has been noted above.  The following changes have been made:  See above.  Labs/ tests ordered today include:   No orders of the defined types were placed in this encounter.     Disposition:   FU with me in 12  months.   Patient is agreeable to this plan and will call if any problems develop in the interim.   Signed: Rosalio Macadamia, RN, ANP-C 11/02/2014 9:44 AM  [facility] [address] [place], [place]  [address] Phone: [phone] Fax: [phone]

## 2014-11-15 ENCOUNTER — Ambulatory Visit: Payer: Medicare Other | Admitting: Nurse Practitioner

## 2014-12-10 ENCOUNTER — Other Ambulatory Visit: Payer: Self-pay | Admitting: Cardiovascular Disease

## 2015-01-28 IMAGING — CR DG CHEST 2V
2 series · 2 of 2 positions shown · non-contrast
Comparison: 11/11/2013

CLINICAL DATA: Chest pressure for about 1 week

EXAM:
CHEST  2 VIEW

[view not recorded (1 of 2)]
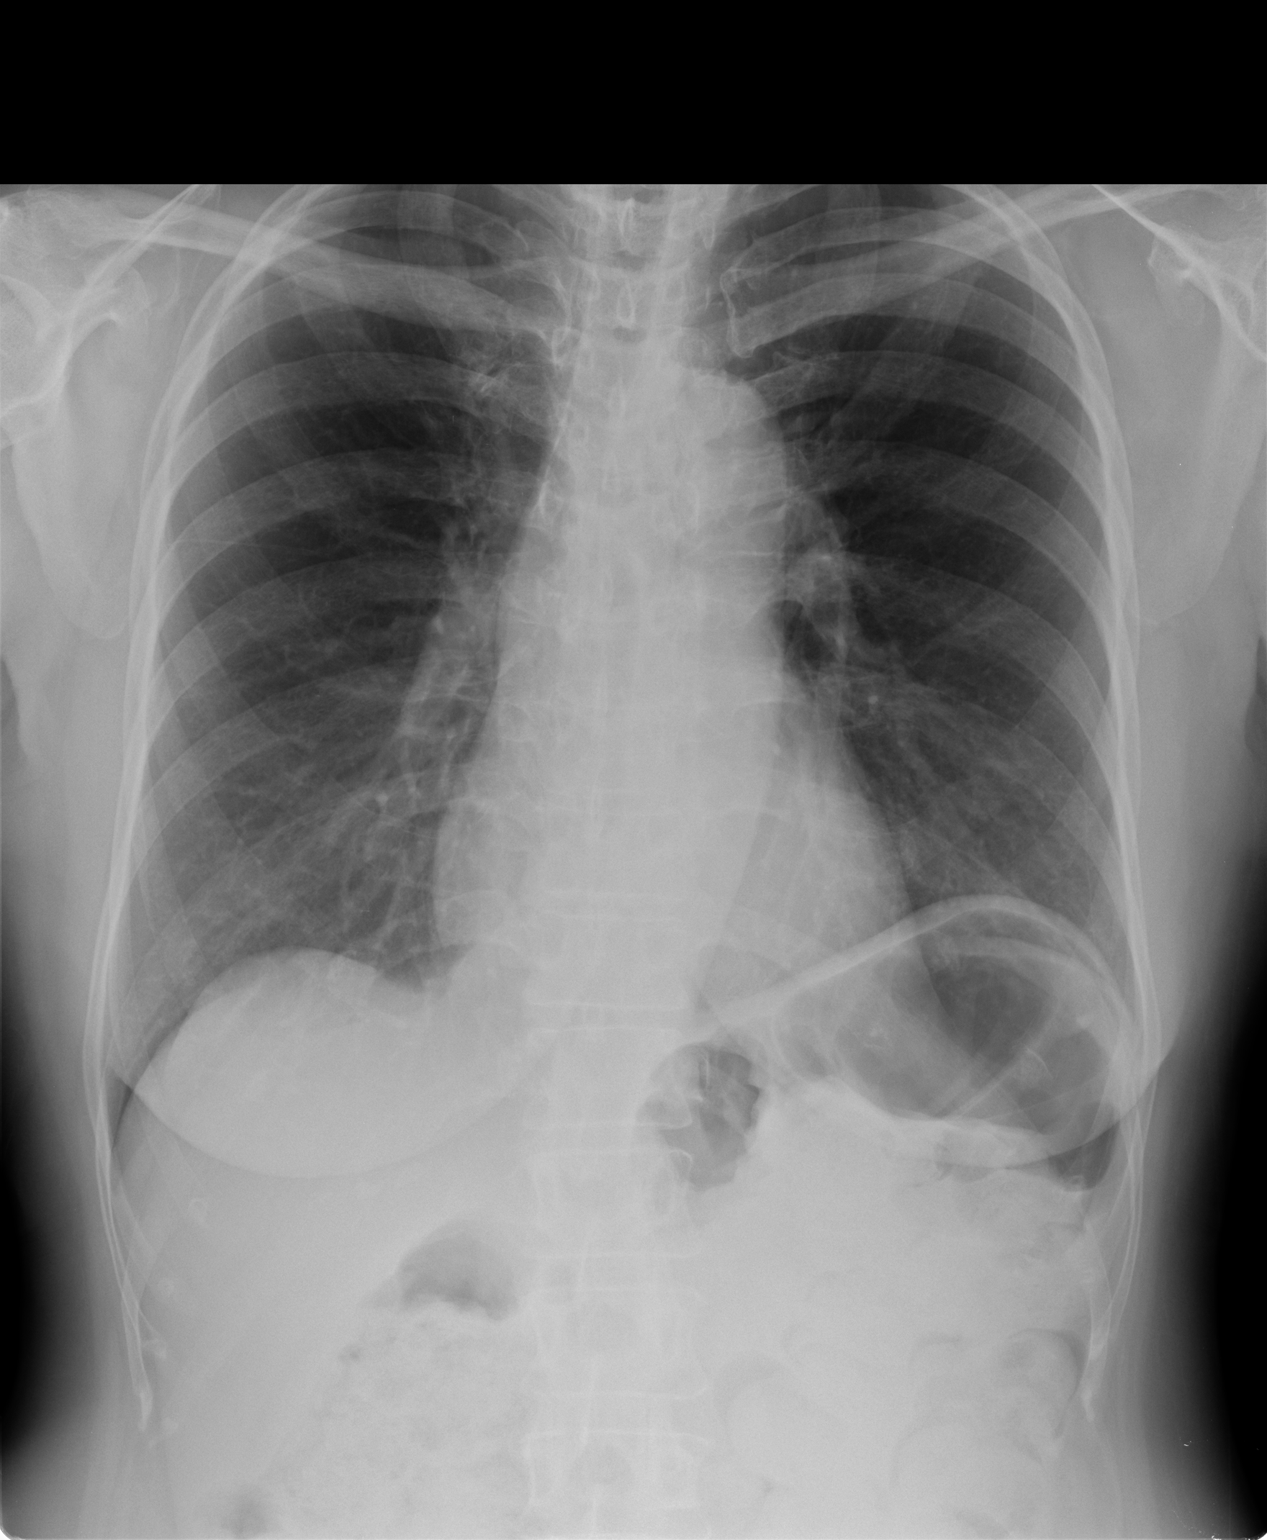

[view not recorded (2 of 2)]
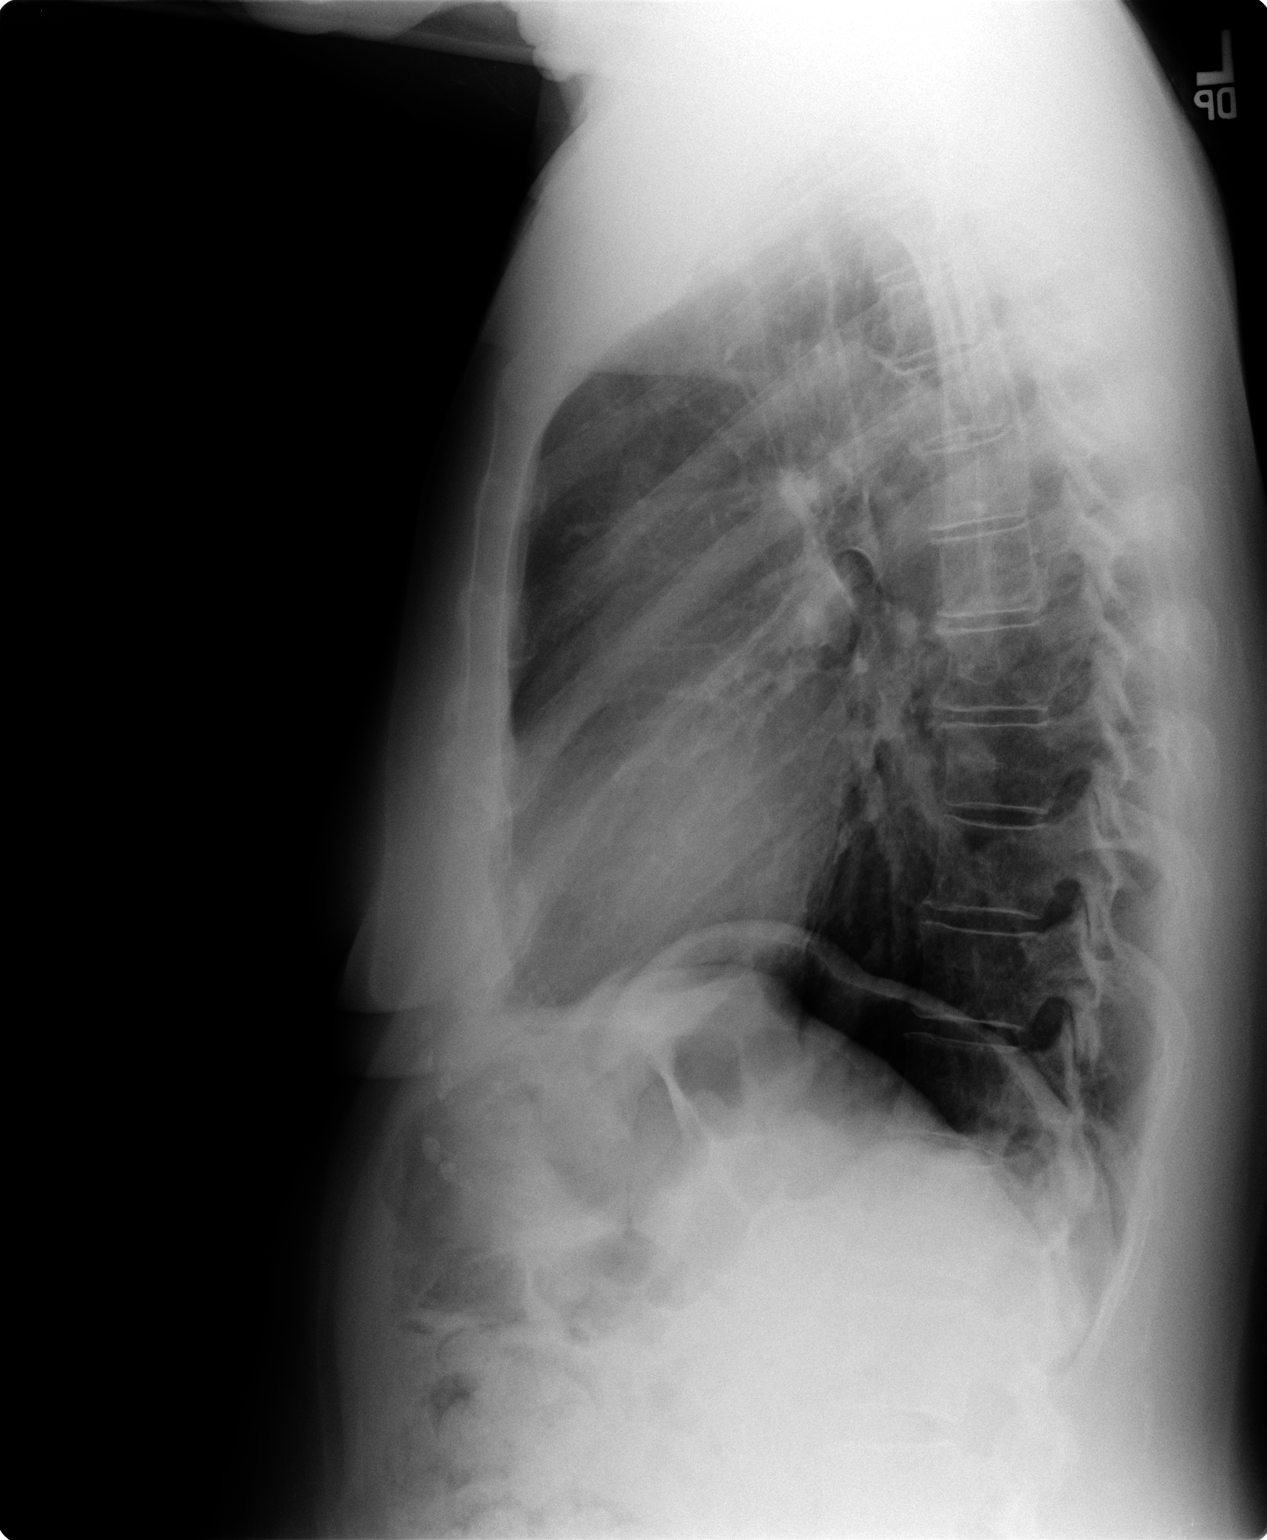

[2 of 2 positions shown; findings below may reference images not displayed]

FINDINGS: The heart size and mediastinal contours are within normal limits.
Both lungs are clear. The visualized skeletal structures are
unremarkable.
IMPRESSION: No active cardiopulmonary disease.

## 2015-02-08 ENCOUNTER — Telehealth: Payer: Self-pay | Admitting: Nurse Practitioner

## 2015-02-08 NOTE — Telephone Encounter (Signed)
New message      Talk to Kimberly Mccullough Fiscal about her metoprolol.  She has questions about the medication

## 2015-02-09 NOTE — Telephone Encounter (Signed)
Can stop the metoprolol and try Verapamil ER 180 mg daily.

## 2015-02-09 NOTE — Telephone Encounter (Signed)
S/w pt is concerned pt is losing more hair.  Stated has been researching and stated metoprolol could cause hair loss.  would like to come off of metoprolol and try something new to see if the medication is causing pt to lose hair.  Will route to Norma FredricksonLori Gerhardt, NP, to advise.

## 2015-02-09 NOTE — Telephone Encounter (Signed)
S/w pt has been off metoprolol for three weeks and hasn't had any symptoms, started back yesterday but has not taken medication today.  Would like to research the verapamil and will call us back.

## 2015-02-09 NOTE — Telephone Encounter (Signed)
I was more in favor with switching. She is on metoprolol for both palpitations and her elevated BP.   If we stop the metoprolol, she will need to closely monitor her BP for us.   I will let her try this.

## 2015-02-09 NOTE — Telephone Encounter (Signed)
When I s/w pt about changing medications stated, Oh I thought I could just get off medication and not take anything.  Pt stated is working out 5 days a weeks and pt feels she doesn't need medication.  Stated I cannot just take pt off of medication pt  would have to be reevaluated. Also pt asked if this medication has the same side effect as metoprolol of losing hair.  Stated I do not research medications pt would have to research herself. Pt stated would have to research medication and get back to me. [place]

## 2015-02-14 ENCOUNTER — Telehealth: Payer: Self-pay | Admitting: Nurse Practitioner

## 2015-02-14 ENCOUNTER — Other Ambulatory Visit: Payer: Self-pay | Admitting: *Deleted

## 2015-02-14 MED ORDER — VERAPAMIL HCL ER 180 MG PO TBCR: 180.0000 mg | EXTENDED_RELEASE_TABLET | Freq: Every day | ORAL | Status: DC

## 2015-02-14 NOTE — Telephone Encounter (Signed)
Pt decided to try new medication verapamil er ( 180 mg ) daily.  Checked bp over the last several days stated was all over the place but did get up to 157/104 HR 97 down to 102/71 HR 61. Sent script in to [facility] in [place] per pt's request.  Pt will keep a check on bp with new medication.  Will call if any problems.

## 2015-02-14 NOTE — Telephone Encounter (Signed)
New message  ° ° °Patient calling back to speak with nurse from Friday.   °

## 2015-03-09 ENCOUNTER — Other Ambulatory Visit: Payer: Self-pay

## 2015-03-09 NOTE — Telephone Encounter (Signed)
Kimberly Mccullough at 02/14/2015 11:05 AM    Status: Signed      Expand All Collapse All    Pt decided to try new medication verapamil er ( 180 mg ) daily. Checked bp over the last several days stated was all over the place but did get up to 157/104 HR 97 down to 102/71 HR 61. Sent script [facility] to [facility] [place] per pt's request. Pt will keep a check on bp with new medication. Will call if any problems.

## 2015-03-11 ENCOUNTER — Other Ambulatory Visit: Payer: Self-pay | Admitting: *Deleted

## 2015-03-11 MED ORDER — VERAPAMIL HCL ER 180 MG PO TBCR: 180.0000 mg | EXTENDED_RELEASE_TABLET | Freq: Every day | ORAL | Status: DC

## 2015-03-15 ENCOUNTER — Other Ambulatory Visit: Payer: Self-pay

## 2015-06-09 ENCOUNTER — Telehealth: Payer: Self-pay | Admitting: Nurse Practitioner

## 2015-06-09 NOTE — Telephone Encounter (Signed)
New message   Pt calling to speak to rn to determine is issue is cardiac issues

## 2015-06-09 NOTE — Telephone Encounter (Signed)
Pt stated heart beat feels harder like beating out of chest.  Was dizzy once on Friday when getting up from a chair after working out.  Stated needs to get up slower and drink some fluids. Pt is anxious.  Denies pre-syncope, sob, does have a little chest pressure.  Was at gyno on Monday bp 122/78 but pt states on Feb 3 bp was 193/95 HR 61.  Stated if pt has chest pain over the weekend to call 911 or go to ER. Pt stated verbal understanding.  Appointment was made for pt on February 15 @ 11.45, will send to [place] to [place]

## 2015-06-09 NOTE — Telephone Encounter (Signed)
New message    Pt wants rn to call her to see if her issues are cardiac issues

## 2015-06-13 NOTE — Telephone Encounter (Signed)
Kimberly Mccullough, s/w pt this am and pt stated is keeping appointment made on February 14 other appointment cancelled.

## 2015-06-13 NOTE — Telephone Encounter (Signed)
-----   Message from Rosalio Macadamia, NP sent at 06/12/2015  8:56 PM EST ----- She has two appointments with me this week - can we find out which one is valid????  lori

## 2015-06-14 ENCOUNTER — Encounter: Payer: Self-pay | Admitting: Nurse Practitioner

## 2015-06-14 ENCOUNTER — Ambulatory Visit (INDEPENDENT_AMBULATORY_CARE_PROVIDER_SITE_OTHER): Payer: Medicare Other | Admitting: Nurse Practitioner

## 2015-06-14 VITALS — BP 154/90 | HR 52 | Ht 70.0 in | Wt 170.8 lb

## 2015-06-14 DIAGNOSIS — R002 Palpitations: Secondary | ICD-10-CM

## 2015-06-14 DIAGNOSIS — R0789 Other chest pain: Secondary | ICD-10-CM | POA: Diagnosis not present

## 2015-06-14 NOTE — Progress Notes (Signed)
CARDIOLOGY OFFICE NOTE  Date:  06/14/2015    Kimberly Mccullough Date of Birth: 1945/06/10 Medical Record #[id number]  PCP:  Kimberly Nine, MD  Cardiologist:  [facility]    Chief Complaint  Patient presents with  . Chest Pain  . Palpitations    Work in visit - seen for Dr. Clifton James    History of Present Illness: Kimberly Mccullough is a 70 y.o. female who presents today for a work in visit. Seen for Dr. Clifton James. She has HTN, HLD, PTSD and palpitations. Remote negative Holter from 2013. Has had lots of stress/anxiety/guilt associated with death of her husband. She is a Jehovah Witness.   I saw her back in the summer of 2015 with palpitations - I advised her to cut back on her caffeine (was drinking up to 7 cups of coffee per day). Stress echo from 2014 looked ok at that time except for LVH. She has been hesitant to try statin therapy.   Was at the Urgent Care thru [facility] back in October of 2015 - was complaining of shortness of breath. Seemed nervous. Advised to go to the ER. EKG negative. Most of her issues centered around her anxiety. She had a GXT in November of 2015- negative for ischemia but had poor exercise tolerance and BP was up - she had not taken her medicine for that study. Recommended to see Behavioral Health for counseling but she has been very resistant to see anyone.   Last seen by me back in July - cardiac status was stable.   Phone call last week - BP labile. Having palpitations and some chest pressure. Thus added to my schedule.   Comes back today. Here alone. She has numerous complaints. The primary issues is - she nervous and jittery. She has never sought counseling for her anxieties/stress/PTSD. She says her son feels like she is becoming a hypochondriac. She is worried she has Parkinson's. She is worried that something is wrong with her heart. She continues to have palpitations with associated chest pressure. Notes that BP is typically reflective of  her level of anxiety. She is able to exercise for an hour 5 days a week with no real issue. She says she is ready to see someone for treatment for her anxiety.   Past Medical History  Diagnosis Date  . Hyperlipidemia   . PTSD (post-traumatic stress disorder)   . No blood products   . Arthritis   . Rapid heart beat     takes metoprolol to treat / denies HBP  . Hypertension   . Osteopenia   . Tinnitus     Past Surgical History  Procedure Laterality Date  . Abdominal hysterectomy    . Cholecystectomy    . Bunionectomy      BILATERAL  . Total hip arthroplasty Left 11/11/2013    Procedure: LEFT TOTAL HIP ARTHROPLASTY ANTERIOR APPROACH;  Surgeon: Loanne Drilling, MD;  Location: [facility];  Service: Orthopedics;  Laterality: Left;  . Total knee arthroplasty Left 08/16/2014    Procedure: LEFT TOTAL KNEE ARTHROPLASTY;  Surgeon: Ollen Gross, MD;  Location: [facility];  Service: Orthopedics;  Laterality: Left;  . Joint replacement       Medications: Current Outpatient Prescriptions  Medication Sig Dispense Refill  . aspirin EC 81 MG tablet Take 81 mg by mouth daily.     . Biotin 1000 MCG tablet Take 1,000 mcg by mouth daily.    . calcium carbonate (OSCAL) 1500 (600 Ca) MG  TABS tablet Take 600 mg by mouth daily with breakfast.     . meloxicam (MOBIC) 15 MG tablet Take 15 mg by mouth daily.    . verapamil (CALAN-SR) 180 MG CR tablet Take 1 tablet (180 mg total) by mouth at bedtime. 90 tablet 3  . rosuvastatin (CRESTOR) 10 MG tablet Take 10 mg by mouth. Reported on 06/14/2015     No current facility-administered medications for this visit.    Allergies: Allergies  Allergen Reactions  . Other     Pt is Jehovas Witness and does not want any blood products    Social History: The patient  reports that she has never smoked. She does not have any smokeless tobacco history on file. She reports that she does not drink alcohol or use illicit drugs.   Family History: The patient's family history  includes Dementia in her mother; Liver disease in her father. There is no history of CAD.   Review of Systems: Please see the history of present illness.   Otherwise, the review of systems is positive for none.   All other systems are reviewed and negative.   Physical Exam: VS:  BP 154/90 mmHg  Pulse 52  Ht  (1.778 m)  Wt 170 lb 12.8 oz (77.474 kg)  BMI 24.51 kg/m2 .  BMI Body mass index is 24.51 kg/(m^2).  Wt Readings from Last 3 Encounters:  06/14/15 170 lb 12.8 oz (77.474 kg)  11/02/14 176 lb (79.833 kg)  08/16/14 182 lb (82.555 kg)   BP recheck by me is 140/80.   General: Pleasant. Well developed, well nourished and in no acute distress.  HEENT: Normal. Neck: Supple, no JVD, carotid bruits, or masses noted.  Cardiac: Regular rate and rhythm. No murmurs, rubs, or gallops. No edema.  Respiratory:  Lungs are clear to auscultation bilaterally with normal work of breathing.  GI: Soft and nontender.  MS: No deformity or atrophy. Gait and ROM intact. Skin: Warm and dry. Color is normal.  Neuro:  Strength and sensation are intact and no gross focal deficits noted.  Psych: Alert, appropriate and with normal affect.   LABORATORY DATA:  EKG:  EKG is ordered today. This demonstrates sinus bradycardia.  Lab Results  Component Value Date   WBC 7.6 08/19/2014   HGB 9.5* 08/19/2014   HCT 28.4* 08/19/2014   PLT 170 08/19/2014   GLUCOSE 110* 08/18/2014   CHOL 222* 11/14/2012   TRIG 61.0 11/14/2012   HDL 53.70 11/14/2012   LDLDIRECT 167.1 11/14/2012   ALT 16 08/05/2014   AST 24 08/05/2014   NA 138 08/18/2014   K 4.0 08/18/2014   CL 106 08/18/2014   CREATININE 0.77 08/18/2014   BUN 19 08/18/2014   CO2 25 08/18/2014   TSH 1.63 10/29/2013   INR 0.96 08/05/2014    BNP (last 3 results) No results for input(s): BNP in the last 8760 hours.  ProBNP (last 3 results) No results for input(s): PROBNP in the last 8760 hours.   Other Studies Reviewed Today:  GXT from  November 2015 Patient presents today for routine GXT. Has had HTN, HLD, PTSD and palpitations. Negative stress echo from summer of 2014. Has had issues with anxiety, dyspnea, and chest heaviness.   Today the patient exercised on the standard Bruce protocol for a total of 3 minutes.  Reduced exercise tolerance.  Hypertensive blood pressure response - She did not take her BP medicines today.  Clinically negative for chest pain. Test was stopped due to  BP response.  EKG negative for ischemia. No significant arrhythmia noted except for a rare PVC and a short run of PAT.   Recommendations: Continue with beta blocker - needs to take her dose for today  See back as planned  Still encouraged to seek counseling - advised to call Behavioral Health with [facility].   Stress Echo Study Conclusions from June 2014  - Stress ECG conclusions: There were no stress arrhythmias or conduction abnormalities. The stress ECG was negative for ischemia. - Staged echo: There was no echocardiographic evidence for stress-induced ischemia. - Impressions: Moderate LVH with septal thickness 14.5 mm Impressions:  - Moderate LVH with septal thickness 14.5 mm  ------------------------------------------------------------  ASSESSMENT AND PLAN: 1. HTN - fair control here today - very hesitant to take additional medicine.   2. Anxiety/stress/PTSD - this continues to be her most pressing issue - I have given her numbers for Dr. Evelene Croon and [facility].   3. Palpitations and chest pain - will repeat her Holter and GXT - I feel like these will be ok and if so, can be reassuring for her.   4. HLD - now on Crestor by PCP. Labs are by PCP  Current medicines are reviewed with the patient today.  The patient does not have concerns regarding medicines other than what has been noted above.  The following changes have been made:  See above.  Labs/ tests ordered today include:    Orders Placed This Encounter    Procedures  . Exercise Tolerance Test  . Holter monitor - 48 hour  . EKG 12-Lead     Disposition:   FU    Patient is agreeable to this plan and will call if any problems develop in the interim.   Signed: Rosalio Macadamia, RN, ANP-C 06/14/2015 12:59 PM  [facility] [address] [place], [place]  [address] Phone: [phone] Fax: [phone]

## 2015-06-14 NOTE — Patient Instructions (Addendum)
We will be checking the following labs today - NONE   Medication Instructions:    Continue with your current medicines.     Testing/Procedures To Be Arranged:  GXT  48 Holter  Follow-Up:   See me in July as planned.     Other Special Instructions:   Call either [facility] - [phone]  Dr. Milagros Evener [phone]    If you need a refill on your cardiac medications before your next appointment, please call your pharmacy.   Call the [facility] office at [phone] if you have any questions, problems or concerns.

## 2015-06-17 ENCOUNTER — Ambulatory Visit: Payer: Medicare Other | Admitting: Nurse Practitioner

## 2015-06-21 ENCOUNTER — Telehealth: Payer: Self-pay | Admitting: Nurse Practitioner

## 2015-06-21 NOTE — Telephone Encounter (Signed)
S/w pt to let pt know she can take her medications the morning of test.

## 2015-06-21 NOTE — Telephone Encounter (Signed)
New message     Pt is having a stress test on Friday.  Should she take her medications?

## 2015-06-24 ENCOUNTER — Ambulatory Visit (INDEPENDENT_AMBULATORY_CARE_PROVIDER_SITE_OTHER): Payer: Medicare Other

## 2015-06-24 DIAGNOSIS — R002 Palpitations: Secondary | ICD-10-CM

## 2015-06-24 DIAGNOSIS — R0789 Other chest pain: Secondary | ICD-10-CM

## 2015-06-24 LAB — EXERCISE TOLERANCE TEST
Estimated workload: 7.3 METS
Exercise duration (min): 6 min
Exercise duration (sec): 34 s
MPHR: 152 {beats}/min
Peak HR: 129 {beats}/min
Percent HR: 85 %
Percent of predicted max HR: 84 %
RPE: 15
Rest HR: 64 {beats}/min
Stage 1 DBP: 81 mmHg
Stage 1 Grade: 0 %
Stage 1 HR: 63 {beats}/min
Stage 1 SBP: 142 mmHg
Stage 1 Speed: 0 mph
Stage 2 Grade: 0 %
Stage 2 HR: 68 {beats}/min
Stage 2 Speed: 1 mph
Stage 3 Grade: 0 %
Stage 3 HR: 68 {beats}/min
Stage 3 Speed: 1 mph
Stage 4 DBP: 84 mmHg
Stage 4 Grade: 10 %
Stage 4 HR: 105 {beats}/min
Stage 4 SBP: 168 mmHg
Stage 4 Speed: 1.7 mph
Stage 5 DBP: 85 mmHg
Stage 5 Grade: 12 %
Stage 5 HR: 126 {beats}/min
Stage 5 SBP: 200 mmHg
Stage 5 Speed: 2.5 mph
Stage 6 Grade: 14.3 %
Stage 6 HR: 129 {beats}/min
Stage 6 Speed: 2.7 mph
Stage 7 DBP: 82 mmHg
Stage 7 Grade: 0 %
Stage 7 HR: 125 {beats}/min
Stage 7 SBP: 160 mmHg
Stage 7 Speed: 0 mph
Stage 8 DBP: 82 mmHg
Stage 8 Grade: 0 %
Stage 8 HR: 71 {beats}/min
Stage 8 SBP: 152 mmHg
Stage 8 Speed: 0 mph

## 2015-06-27 ENCOUNTER — Telehealth: Payer: Self-pay | Admitting: Nurse Practitioner

## 2015-06-27 NOTE — Telephone Encounter (Signed)
New message ° ° ° ° °Returning a call to the nurse °

## 2015-06-30 ENCOUNTER — Telehealth: Payer: Self-pay | Admitting: *Deleted

## 2015-06-30 NOTE — Telephone Encounter (Signed)
Spoke with pt and reviewed monitor results with her.  She reports one episode of dizziness a couple of weeks ago. She had been to gym and then to hairdresser.  Felt dizzy upon standing.  Will forward to Dr Clifton James to see if she should continue Verapamil. Pt reports she was started on ASA 81 mg daily by primary care.  She is also on Mobic and is asking if OK to take ASA daily.

## 2015-06-30 NOTE — Telephone Encounter (Signed)
Follow up ° ° ° ° ° °Returning a call to the nurse °

## 2015-06-30 NOTE — Telephone Encounter (Signed)
I placed call to pt to review holter results. Left message to call back

## 2015-07-01 NOTE — Telephone Encounter (Signed)
Left message to call back  

## 2015-07-01 NOTE — Telephone Encounter (Signed)
Spoke with pt and gave her instructions from Dr. McAlhany.  

## 2015-07-01 NOTE — Telephone Encounter (Signed)
OK to take ASA. I would continue verapamil for now but if any significant dizziness or near syncopal events, she will need to hold the verapamil. chris

## 2015-11-08 ENCOUNTER — Ambulatory Visit (INDEPENDENT_AMBULATORY_CARE_PROVIDER_SITE_OTHER): Payer: Medicare Other | Admitting: Nurse Practitioner

## 2015-11-08 ENCOUNTER — Ambulatory Visit: Payer: Medicare Other | Admitting: Nurse Practitioner

## 2015-11-08 ENCOUNTER — Encounter: Payer: Self-pay | Admitting: Nurse Practitioner

## 2015-11-08 ENCOUNTER — Encounter (INDEPENDENT_AMBULATORY_CARE_PROVIDER_SITE_OTHER): Payer: Self-pay

## 2015-11-08 VITALS — BP 148/86 | HR 55 | Ht 70.0 in | Wt 165.8 lb

## 2015-11-08 DIAGNOSIS — R5382 Chronic fatigue, unspecified: Secondary | ICD-10-CM | POA: Diagnosis not present

## 2015-11-08 DIAGNOSIS — R0789 Other chest pain: Secondary | ICD-10-CM

## 2015-11-08 DIAGNOSIS — I10 Essential (primary) hypertension: Secondary | ICD-10-CM | POA: Diagnosis not present

## 2015-11-08 DIAGNOSIS — R002 Palpitations: Secondary | ICD-10-CM | POA: Diagnosis not present

## 2015-11-08 NOTE — Progress Notes (Signed)
CARDIOLOGY OFFICE NOTE  Date:  11/08/2015    Kimberly Mccullough Date of Birth: 25-Oct-1945 Medical Record #[id number]  PCP:  Eliott Nine, MD  Cardiologist:  Titus Mould    Chief Complaint  Patient presents with  . Chest Pain  . Palpitations    Follow up visit - seen for Dr. Clifton James    History of Present Illness: Kimberly Mccullough is a 70 y.o. female who presents today for a follow up visit. Seen for Dr. Clifton James.   She has HTN, HLD, PTSD and palpitations. Remote negative Holter from 2013. Has had lots of stress/anxiety/guilt associated with death of her husband. She is a Jehovah Witness.   I saw her back in the summer of 2015 with palpitations - I advised her to cut back on her caffeine (was drinking up to 7 cups of coffee per day). Stress echo from 2014 looked ok at that time except for LVH. She has not wanted to take much in the way of medicines.    Last seen by me back in February - numerous complaints again. Holter and GXT updated. Never sought the counseling that I have suggested multiple times - I have gotten the feeling that she wants something to happen to her as a form of punishment for her.    Comes back today. Here alone. She says she is ok but "not ok". Remains very anxious and jittery. Some occasional palpitation. She actually saw a psychologist for about 2 months - diagnosed with adjustment disorder, depression, anxiety and PTSD - then stopped going. Does not wish to be on any medicines but says she wants to feel better. She continues to exercise regularly without issue. BP cuff running about 10 points higher and she has better control at home. She is off her statin - for repeat labs with PCP in just a few months.   Past Medical History  Diagnosis Date  . Hyperlipidemia   . PTSD (post-traumatic stress disorder)   . No blood products   . Arthritis   . Rapid heart beat     takes metoprolol to treat / denies HBP  . Hypertension   .  Osteopenia   . Tinnitus     Past Surgical History  Procedure Laterality Date  . Abdominal hysterectomy    . Cholecystectomy    . Bunionectomy      BILATERAL  . Total hip arthroplasty Left 11/11/2013    Procedure: LEFT TOTAL HIP ARTHROPLASTY ANTERIOR APPROACH;  Surgeon: Loanne Drilling, MD;  Location: [facility];  Service: Orthopedics;  Laterality: Left;  . Total knee arthroplasty Left 08/16/2014    Procedure: LEFT TOTAL KNEE ARTHROPLASTY;  Surgeon: Ollen Gross, MD;  Location: [facility];  Service: Orthopedics;  Laterality: Left;  . Joint replacement       Medications: Current Outpatient Prescriptions  Medication Sig Dispense Refill  . aspirin EC 81 MG tablet Take 81 mg by mouth daily.     . calcium carbonate (OSCAL) 1500 (600 Ca) MG TABS tablet Take 600 mg by mouth daily with breakfast.     . GLUCOSA-CHONDR-NA CHONDR-MSM PO Take 1,500 mcg by mouth 2 (two) times daily.    . meloxicam (MOBIC) 15 MG tablet Take 15 mg by mouth daily.    Marland Kitchen omega-3 acid ethyl esters (LOVAZA) 1 g capsule Take 1 g by mouth 2 (two) times daily.    . verapamil (CALAN-SR) 180 MG CR tablet Take 1 tablet (180 mg total) by mouth at  bedtime. 90 tablet 3  . vitamin E 400 UNIT capsule Take 400 Units by mouth daily.     No current facility-administered medications for this visit.    Allergies: Allergies  Allergen Reactions  . Other     Pt is Jehovas Witness and does not want any blood products    Social History: The patient  reports that she has never smoked. She does not have any smokeless tobacco history on file. She reports that she does not drink alcohol or use illicit drugs.   Family History: The patient's family history includes Dementia in her mother; Liver disease in her father. There is no history of CAD.   Review of Systems: Please see the history of present illness.   Otherwise, the review of systems is positive for none.   All other systems are reviewed and negative.   Physical Exam: VS:  BP 148/86  mmHg  Pulse 55  Ht 5\' 10"  (1.778 m)  Wt 165 lb 12.8 oz (75.206 kg)  BMI 23.79 kg/m2  SpO2  .  BMI Body mass index is 23.79 kg/(m^2).  Wt Readings from Last 3 Encounters:  11/08/15 165 lb 12.8 oz (75.206 kg)  06/14/15 170 lb 12.8 oz (77.474 kg)  11/02/14 176 lb (79.833 kg)    General:  Anxious. She is alert and in no acute distress. Weight is down 5 pounds.  HEENT: Normal. Neck: Supple, no JVD, carotid bruits, or masses noted.  Cardiac: Regular rate and rhythm. No murmurs, rubs, or gallops. No edema.  Respiratory:  Lungs are clear to auscultation bilaterally with normal work of breathing.  GI: Soft and nontender.  MS: No deformity or atrophy. Gait and ROM intact. Skin: Warm and dry. Color is normal.  Neuro:  Strength and sensation are intact and no gross focal deficits noted.  Psych: Alert, appropriate and with normal affect.   LABORATORY DATA:  EKG:  EKG is not ordered today.  Lab Results  Component Value Date   WBC 7.6 08/19/2014   HGB 9.5* 08/19/2014   HCT 28.4* 08/19/2014   PLT 170 08/19/2014   GLUCOSE 110* 08/18/2014   CHOL 222* 11/14/2012   TRIG 61.0 11/14/2012   HDL 53.70 11/14/2012   LDLDIRECT 167.1 11/14/2012   ALT 16 08/05/2014   AST 24 08/05/2014   NA 138 08/18/2014   K 4.0 08/18/2014   CL 106 08/18/2014   CREATININE 0.77 08/18/2014   BUN 19 08/18/2014   CO2 25 08/18/2014   TSH 1.63 10/29/2013   INR 0.96 08/05/2014    BNP (last 3 results) No results for input(s): BNP in the last 8760 hours.  ProBNP (last 3 results) No results for input(s): PROBNP in the last 8760 hours.   Other Studies Reviewed Today:  Notes Recorded by Rosalio MacadamiaLori C Kazuko Clemence, NP on 07/03/2015 at 7:28 PM regarding Holter Ok to report. Dr. Clifton JamesMcAlhany has reviewed.  She did not mention to me during our visit about having any dizziness or near syncope - please confirm.  If she is, she will need to stop her Verapamil and I would like for her to see Dr. Clifton JamesMcAlhany for a follow up visit.  If  she is not - stay on current regimen with follow up as planned.  GXT Study Highlights from 06/2015     There was no ST segment deviation noted during stress.  Blood pressure demonstrated a hypertensive response to exercise.  Negative exercise treadmill test at an adequate workload and heart rate achieved.  Stress Echo Study Conclusions from June 2014  - Stress ECG conclusions: There were no stress arrhythmias or conduction abnormalities. The stress ECG was negative for ischemia. - Staged echo: There was no echocardiographic evidence for stress-induced ischemia. - Impressions: Moderate LVH with septal thickness 14.5 mm Impressions:  - Moderate LVH with septal thickness 14.5 mm  ------------------------------------------------------------  ASSESSMENT AND PLAN: 1. HTN -BP better at home. Her cuff runs about 10 points higher than here. I have left her on her current regimen.   2. Anxiety/stress/PTSD - this continues to be her most pressing issue - I have given her numbers for Dr. Evelene Croon and [facility] in the past - she is asking again what she should do. I really can't help her with these issues and I have asked to start with PCP but may need psychiatry to prescribe medicines and I encouraged her to be open to medical therapy along with counseling.   3. Palpitations and chest pain - negative Holter and GXT just 5 months ago.   4. HLD - no longer on statin therapy. Labs are done with PCP  Current medicines are reviewed with the patient today.  The patient does not have concerns regarding medicines other than what has been noted above.  The following changes have been made:  See above.  Labs/ tests ordered today include:   No orders of the defined types were placed in this encounter.     Disposition:   FU with Dr. Clifton James in one year.    Patient is agreeable to this plan and will call if any problems develop in the interim.   Signed: Rosalio Macadamia, RN,  ANP-C 11/08/2015 10:19 AM  [facility] [address] [place], [place]  [address] Phone: [phone] Fax: [phone]

## 2015-11-08 NOTE — Patient Instructions (Addendum)
We will be checking the following labs today - NONE   Medication Instructions:    Continue with your current medicines.     Testing/Procedures To Be Arranged:  N/A  Follow-Up:   See Dr. Clifton JamesMcAlhany in one year    Other Special Instructions:   See your primary care doctor as we discussed today.    If you need a refill on your cardiac medications before your next appointment, please call your pharmacy.   Call the [facility] office at [phone] if you have any questions, problems or concerns.

## 2016-01-08 ENCOUNTER — Other Ambulatory Visit: Payer: Self-pay | Admitting: Nurse Practitioner

## 2016-01-09 ENCOUNTER — Other Ambulatory Visit: Payer: Self-pay

## 2016-01-09 MED ORDER — VERAPAMIL HCL ER 180 MG PO TBCR: 180.0000 mg | EXTENDED_RELEASE_TABLET | Freq: Every day | ORAL | 3 refills | Status: DC

## 2016-03-28 ENCOUNTER — Telehealth: Payer: Self-pay | Admitting: Physician Assistant

## 2016-03-28 NOTE — Telephone Encounter (Signed)
Patient patient after hour answering service with concern of fast heart rate. She has a history of palpitation and takes verapamil at home. Normally her heart rate is in the 50s, however she has been noticing that her heart rate ranging around 84-99 in the last few days. She denies any other discomfort including chest pain, shortness of breath, dizziness. She says she has been very anxious lately. I have asked her to continue to observe the heart rate along with blood pressure twice a day, if she does develop symptom or heart rate continued to increase, she has been advised to see her PCP. Note patient has a history of anemia based on her previous lab in April 2016 which could potentially be an another cause for her tachycardia assume she is now anemic, however again she does not have any symptoms.  Ramond DialSigned, Reba Hulett PA Pager: [phone]

## 2016-08-27 NOTE — Progress Notes (Signed)
CARDIOLOGY OFFICE NOTE  Date:  08/28/2016    [name] Date of Birth: [birth year] Medical Record #[id number]  PCP:  Eliott Nine, MD  Cardiologist:  Titus Mould    Chief Complaint  Patient presents with  . Pre-op Exam    Work in visit - seen for Dr. Clifton James    History of Present Illness: [name] is a 71 y.o. female who presents today for a pre op visit. Seen for Dr. Clifton James.   She has HTN, HLD, PTSD and palpitations. Remote negative Holter from 2013 and 2017. Has had lots of stress/anxiety/guilt associated with death of her husband. She is a Jehovah Witness. No known CAD.   I saw her back in the summer of 2015 with palpitations - I advised her to cut back on her caffeine (was drinking up to 7 cups of coffee per day). Stress echo from 2014 looked ok at that time except for LVH. She has not wanted to take much in the way of medicines.    I saw her back in February of 2017 - numerous complaints again. Holter and GXT updated. Never sought the counseling that I have suggested multiple times - I have gotten the feeling that she wants something to happen to her as a form of punishment for the guilt she has with her husband's death.   Last seen back in Nov 12, 2022 - very anxious. She had seen a psychologist for a short while but then stopped going - cardiac status felt to be stable. She does not have known CAD.   Comes back today. Here alone. Wanting to have total knee replacement - this is planned for June. Notes some elevation in her heart rate at times - with exercise - sometimes with where she is standing in her kitchen. She has a FitBit on. She says she is doing ok - still with lots of anxiety. Her hands will tingle at times. No chest pain. Not short of breath. Feels tired. This is chronic. She can exercises about 3 times a week without issue. Getting more limited by her knee. Can ride the bike for about 30 minutes without issue. No syncope. Her  palpitations are chronic - she feels like this makes her heart "heavy" - this is chronic as well. She is worried about her "elevated HR".   Past Medical History:  Diagnosis Date  . Arthritis   . Hyperlipidemia   . Hypertension   . No blood products   . Osteopenia   . PTSD (post-traumatic stress disorder)   . Rapid heart beat    takes metoprolol to treat / denies HBP  . Tinnitus     Past Surgical History:  Procedure Laterality Date  . ABDOMINAL HYSTERECTOMY    . BUNIONECTOMY     BILATERAL  . CHOLECYSTECTOMY    . JOINT REPLACEMENT    . TOTAL HIP ARTHROPLASTY Left 11/11/2013   Procedure: LEFT TOTAL HIP ARTHROPLASTY ANTERIOR APPROACH;  Surgeon: Loanne Drilling, MD;  Location: [facility];  Service: Orthopedics;  Laterality: Left;  . TOTAL KNEE ARTHROPLASTY Left 08/16/2014   Procedure: LEFT TOTAL KNEE ARTHROPLASTY;  Surgeon: Ollen Gross, MD;  Location: [facility];  Service: Orthopedics;  Laterality: Left;     Medications: Current Outpatient Prescriptions  Medication Sig Dispense Refill  . aspirin EC 81 MG tablet Take 81 mg by mouth daily.     . B Complex Vitamins (VITAMIN-B COMPLEX) TABS Take 2 tablets by mouth daily.     Marland Kitchen  Biotin 1 MG CAPS Take 3 capsules by mouth daily.     . calcium carbonate (OSCAL) 1500 (600 Ca) MG TABS tablet Take 600 mg by mouth daily with breakfast.     . FIBER ADULT GUMMIES PO Take 2 tablets by mouth daily.    Marland Kitchen GLUCOSA-CHONDR-NA CHONDR-MSM PO Take 1,500 mcg by mouth 2 (two) times daily.    . meloxicam (MOBIC) 15 MG tablet Take 15 mg by mouth daily.    . Multiple Vitamin (MULTIVITAMIN WITH MINERALS) TABS tablet Take 2 tablets by mouth daily.    Marland Kitchen omega-3 acid ethyl esters (LOVAZA) 1 g capsule Take 1 g by mouth 2 (two) times daily.    . verapamil (CALAN-SR) 120 MG CR tablet Take 120 mg by mouth 2 (two) times daily.     . vitamin E 400 UNIT capsule Take 400 Units by mouth daily.     No current facility-administered medications for this visit.      Allergies: Allergies  Allergen Reactions  . Other     Pt is Jehovas Witness and does not want any blood products Pt is Jehovas Witness and does not want any blood products  . Statins Other (See Comments)    myalgia    Social History: The patient  reports that she has never smoked. She has never used smokeless tobacco. She reports that she does not drink alcohol or use drugs.   Family History: The patient's family history includes Dementia in her mother; Liver disease in her father.   Review of Systems: Please see the history of present illness.   Otherwise, the review of systems is positive for none.   All other systems are reviewed and negative.   Physical Exam: VS:  BP 128/90 (BP Location: Left Arm, Patient Position: Sitting, Cuff Size: Normal)   Ht  (1.778 m)   Wt 164 lb 1.9 oz (74.4 kg)   BMI 23.55 kg/m  .  BMI Body mass index is 23.55 kg/m.  Wt Readings from Last 3 Encounters:  08/28/16 164 lb 1.9 oz (74.4 kg)  11/08/15 165 lb 12.8 oz (75.2 kg)  06/14/15 170 lb 12.8 oz (77.5 kg)    General: Pleasant. Well developed, well nourished and in no acute distress. She is thin and tall.   HEENT: Normal.  Neck: Supple, no JVD, carotid bruits, or masses noted.  Cardiac: Regular rate and rhythm. No murmurs, rubs, or gallops. No edema.  Respiratory:  Lungs are clear to auscultation bilaterally with normal work of breathing.  GI: Soft and nontender.  MS: No deformity or atrophy. Gait and ROM intact.  Skin: Warm and dry. Color is normal.  Neuro:  Strength and sensation are intact and no gross focal deficits noted.  Psych: Alert, appropriate and with normal affect.   LABORATORY DATA:  EKG:  EKG is ordered today. This demonstrates NSR.  Lab Results  Component Value Date   WBC 7.6 08/19/2014   HGB 9.5 (L) 08/19/2014   HCT 28.4 (L) 08/19/2014   PLT 170 08/19/2014   GLUCOSE 110 (H) 08/18/2014   CHOL 222 (H) 11/14/2012   TRIG 61.0 11/14/2012   HDL 53.70  11/14/2012   LDLDIRECT 167.1 11/14/2012   ALT 16 08/05/2014   AST 24 08/05/2014   NA 138 08/18/2014   K 4.0 08/18/2014   CL 106 08/18/2014   CREATININE 0.77 08/18/2014   BUN 19 08/18/2014   CO2 25 08/18/2014   TSH 1.63 10/29/2013   INR 0.96 08/05/2014  BNP (last 3 results) No results for input(s): BNP in the last 8760 hours.  ProBNP (last 3 results) No results for input(s): PROBNP in the last 8760 hours.   Other Studies Reviewed Today:  Notes Recorded by Rosalio Macadamia, NP on 07/03/2015 at 7:28 PM regarding Holter Ok to report. Dr. Clifton James has reviewed.  She did not mention to me during our visit about having any dizziness or near syncope - please confirm.  If she is, she will need to stop her Verapamil and I would like for her to see Dr. Clifton James for a follow up visit.  If she is not - stay on current regimen with follow up as planned.     GXT Study Highlights from 06/2015     There was no ST segment deviation noted during stress.  Blood pressure demonstrated a hypertensive response to exercise.  Negative exercise treadmill test at an adequate workload and heart rate achieved.    Stress Echo Study Conclusions from June 2014  - Stress ECG conclusions: There were no stress arrhythmias or conduction abnormalities. The stress ECG was negative for ischemia. - Staged echo: There was no echocardiographic evidence for stress-induced ischemia. - Impressions: Moderate LVH with septal thickness 14.5 mm Impressions:  - Moderate LVH with septal thickness 14.5 mm  ------------------------------------------------------------  ASSESSMENT AND PLAN:  1. Pre op clearance - she should be a satisfactory candidate for her surgery. No active cardiac symptoms. She does not have known coronary disease. Negative GXT from a year ago. Would be available as needed if problems arise.   2. HTN -BP ok. I have left her on her current regimen.   3. Anxiety/stress/PTSD - this  has been her most pressing issue - I have given her numbers for Dr. Evelene Croon and [facility] in the past repeatedly -  I really can't help her with these issues and I have encouraged her to be open to medical therapy along with counseling. We have had this discussion again today.   4. Palpitations and chest pain - negative Holter and GXT from 2017 noted. I think this is driven from anxiety. Explained that a HR in the 80's is totally fine. Explained that the FitBit and other tracking devices not really all that accurate. Do not see need to repeat her studies at this time.   5. HLD - no longer on statin therapy. Labs are done with PCP. Most recent LDL is 142. She is going to speak to her PCP about possible Zetia.    Current medicines are reviewed with the patient today.  The patient does not have concerns regarding medicines other than what has been noted above.  The following changes have been made:  See above.  Labs/ tests ordered today include:    Orders Placed This Encounter  Procedures  . EKG 12-Lead     Disposition:   FU with Dr. Clifton James as planned in July.   Patient is agreeable to this plan and will call if any problems develop in the interim.   SignedNorma Fredrickson, NP  08/28/2016 9:33 AM  [facility] [address] [place], [place]  [address] Phone: [phone] Fax: [phone]

## 2016-08-28 ENCOUNTER — Encounter (INDEPENDENT_AMBULATORY_CARE_PROVIDER_SITE_OTHER): Payer: Self-pay

## 2016-08-28 ENCOUNTER — Encounter: Payer: Self-pay | Admitting: Nurse Practitioner

## 2016-08-28 ENCOUNTER — Ambulatory Visit (INDEPENDENT_AMBULATORY_CARE_PROVIDER_SITE_OTHER): Payer: Medicare Other | Admitting: Nurse Practitioner

## 2016-08-28 VITALS — BP 128/90 | HR 85 | Ht 70.0 in | Wt 164.1 lb

## 2016-08-28 DIAGNOSIS — R002 Palpitations: Secondary | ICD-10-CM

## 2016-08-28 DIAGNOSIS — I10 Essential (primary) hypertension: Secondary | ICD-10-CM | POA: Diagnosis not present

## 2016-08-28 DIAGNOSIS — Z01818 Encounter for other preprocedural examination: Secondary | ICD-10-CM

## 2016-08-28 DIAGNOSIS — F419 Anxiety disorder, unspecified: Secondary | ICD-10-CM

## 2016-08-28 DIAGNOSIS — R5382 Chronic fatigue, unspecified: Secondary | ICD-10-CM

## 2016-08-28 DIAGNOSIS — F4310 Post-traumatic stress disorder, unspecified: Secondary | ICD-10-CM | POA: Diagnosis not present

## 2016-08-28 NOTE — Patient Instructions (Addendum)
We will be checking the following labs today - NONE   Medication Instructions:    Continue with your current medicines.   Ok to talk with your PCP about possibly trying Zetia    Testing/Procedures To Be Arranged:  N/A  Follow-Up:   See Dr. Clifton James as planned in July    Other Special Instructions:   I will send a note to Dr. Lequita Halt - you are felt to be an acceptable candidate for knee surgery from our standpoint.     If you need a refill on your cardiac medications before your next appointment, please call your pharmacy.   Call the [facility] office at [phone] if you have any questions, problems or concerns.

## 2016-09-18 ENCOUNTER — Ambulatory Visit: Payer: Self-pay | Admitting: Orthopedic Surgery

## 2016-10-12 ENCOUNTER — Other Ambulatory Visit (HOSPITAL_COMMUNITY): Payer: Self-pay | Admitting: Emergency Medicine

## 2016-10-12 NOTE — Progress Notes (Signed)
LOV/ cardiology clearance Gerhardt NP 08-28-16 epic EKG 08-28-16 epic Stress test 06-24-15 epic

## 2016-10-12 NOTE — Patient Instructions (Signed)
Kimberly Mccullough  10/12/2016   Your procedure is scheduled on: 10-22-16  [facility]     Report to admitting at 12:20PM   Call this number if you have problems the morning of surgery  [phone]   Remember: ONLY 1 PERSON MAY GO WITH YOU TO SHORT STAY TO GET  READY MORNING OF YOUR SURGERY.  Do not eat food :After Midnight. You may have clear liquids from midnight until 850am day of surgery. Nothing by mouth after 850am!!     Take these medicines the morning of surgery with A SIP OF WATER: verapamil                                You may not have any metal on your body including hair pins and              piercings  Do not wear jewelry, make-up, lotions, powders or perfumes, deodorant             Do not wear nail polish.  Do not shave  48 hours prior to surgery.      Do not bring valuables to the hospital.  IS NOT             RESPONSIBLE   FOR VALUABLES.  Contacts, dentures or bridgework may not be worn into surgery.  Leave suitcase in the car. After surgery it may be brought to your room.               Please read over the following fact sheets you were given: _____________________________________________________________________          CLEAR LIQUID DIET   Foods Allowed                                                                     Foods Excluded  Coffee and tea, regular and decaf                             liquids that you cannot  Plain Jell-O in any flavor                                             see through such as: Fruit ices (not with fruit pulp)                                     milk, soups, orange juice  Iced Popsicles                                    All solid food Carbonated beverages, regular and diet  Cranberry, grape and apple juices Sports drinks like Gatorade Lightly seasoned clear broth or consume(fat free) Sugar, honey syrup  Sample  Menu Breakfast                                Lunch                                     Supper Cranberry juice                    Beef broth                            Chicken broth Jell-O                                     Grape juice                           Apple juice Coffee or tea                        Jell-O                                      Popsicle                                                Coffee or tea                        Coffee or tea  _____________________________________________________________________  [facility] - Preparing for Surgery Before surgery, you can play an important role.  Because skin is not sterile, your skin needs to be as free of germs as possible.  You can reduce the number of germs on your skin by washing with CHG (chlorahexidine gluconate) soap before surgery.  CHG is an antiseptic cleaner which kills germs and bonds with the skin to continue killing germs even after washing. Please DO NOT use if you have an allergy to CHG or antibacterial soaps.  If your skin becomes reddened/irritated stop using the CHG and inform your nurse when you arrive at Short Stay. Do not shave (including legs and underarms) for at least 48 hours prior to the first CHG shower.  You may shave your face/neck. Please follow these instructions carefully:  1.  Shower with CHG Soap the night before surgery and the  morning of Surgery.  2.  If you choose to wash your hair, wash your hair first as usual with your  normal  shampoo.  3.  After you shampoo, rinse your hair and body thoroughly to remove the  shampoo.                           4.  Use CHG as you would any other liquid soap.  You can apply chg directly  to the skin and wash  Gently with a scrungie or clean washcloth.  5.  Apply the CHG Soap to your body ONLY FROM THE NECK DOWN.   Do not use on face/ open                           Wound or open sores. Avoid contact with eyes, ears mouth and genitals  (private parts).                       Wash face,  Genitals (private parts) with your normal soap.             6.  Wash thoroughly, paying special attention to the area where your surgery  will be performed.  7.  Thoroughly rinse your body with warm water from the neck down.  8.  DO NOT shower/wash with your normal soap after using and rinsing off  the CHG Soap.                9.  Pat yourself dry with a clean towel.            10.  Wear clean pajamas.            11.  Place clean sheets on your bed the night of your first shower and do not  sleep with pets. Day of Surgery : Do not apply any lotions/deodorants the morning of surgery.  Please wear clean clothes to the hospital/surgery center.  FAILURE TO FOLLOW THESE INSTRUCTIONS MAY RESULT IN THE CANCELLATION OF YOUR SURGERY PATIENT SIGNATURE_________________________________  NURSE SIGNATURE__________________________________  ________________________________________________________________________   [name]  An incentive spirometer is a tool that can help keep your lungs clear and active. This tool measures how well you are filling your lungs with each breath. Taking long deep breaths may help reverse or decrease the chance of developing breathing (pulmonary) problems (especially infection) following:  A long period of time when you are unable to move or be active. BEFORE THE PROCEDURE   If the spirometer includes an indicator to show your best effort, your nurse or respiratory therapist will set it to a desired goal.  If possible, sit up straight or lean slightly forward. Try not to slouch.  Hold the incentive spirometer in an upright position. INSTRUCTIONS FOR USE  1. Sit on the edge of your bed if possible, or sit up as far as you can in bed or on a chair. 2. Hold the incentive spirometer in an upright position. 3. Breathe out normally. 4. Place the mouthpiece in your mouth and seal your lips tightly around  it. 5. Breathe in slowly and as deeply as possible, raising the piston or the ball toward the top of the column. 6. Hold your breath for 3-5 seconds or for as long as possible. Allow the piston or ball to fall to the bottom of the column. 7. Remove the mouthpiece from your mouth and breathe out normally. 8. Rest for a few seconds and repeat Steps 1 through 7 at least 10 times every 1-2 hours when you are awake. Take your time and take a few normal breaths between deep breaths. 9. The spirometer may include an indicator to show your best effort. Use the indicator as a goal to work toward during each repetition. 10. After each set of 10 deep breaths, practice coughing to be sure your lungs are clear. If you have an incision (the cut made at the time of  surgery), support your incision when coughing by placing a pillow or rolled up towels firmly against it. Once you are able to get out of bed, walk around indoors and cough well. You may stop using the incentive spirometer when instructed by your caregiver.  RISKS AND COMPLICATIONS  Take your time so you do not get dizzy or light-headed.  If you are in pain, you may need to take or ask for pain medication before doing incentive spirometry. It is harder to take a deep breath if you are having pain. AFTER USE  Rest and breathe slowly and easily.  It can be helpful to keep track of a log of your progress. Your caregiver can provide you with a simple table to help with this. If you are using the spirometer at home, follow these instructions: [place] IF:   You are having difficultly using the spirometer.  You have trouble using the spirometer as often as instructed.  Your pain medication is not giving enough relief while using the spirometer.  You develop fever of 100.5 F (38.1 C) or higher. SEEK IMMEDIATE MEDICAL CARE IF:   You cough up bloody sputum that had not been present before.  You develop fever of 102 F (38.9 C) or  greater.  You develop worsening pain at or near the incision site. MAKE SURE YOU:   Understand these instructions.  Will watch your condition.  Will get help right away if you are not doing well or get worse. Document Released: 08/27/2006 Document Revised: 07/09/2011 Document Reviewed: 10/28/2006 [facility] Patient Information 2014 [place], [place].   ________________________________________________________________________

## 2016-10-16 ENCOUNTER — Encounter (INDEPENDENT_AMBULATORY_CARE_PROVIDER_SITE_OTHER): Payer: Self-pay

## 2016-10-16 ENCOUNTER — Encounter (HOSPITAL_COMMUNITY)
Admission: RE | Admit: 2016-10-16 | Discharge: 2016-10-16 | Disposition: A | Discharge status: Home / Self Care | Payer: Medicare Other | Source: Ambulatory Visit | Attending: Orthopedic Surgery | Admitting: Orthopedic Surgery

## 2016-10-16 ENCOUNTER — Encounter (HOSPITAL_COMMUNITY): Payer: Self-pay

## 2016-10-16 DIAGNOSIS — Z01812 Encounter for preprocedural laboratory examination: Secondary | ICD-10-CM | POA: Diagnosis present

## 2016-10-16 DIAGNOSIS — M1711 Unilateral primary osteoarthritis, right knee: Secondary | ICD-10-CM | POA: Diagnosis not present

## 2016-10-16 LAB — CBC
HEMATOCRIT: 40.5 % (ref 36.0–46.0)
HEMOGLOBIN: 13.8 g/dL (ref 12.0–15.0)
MCH: 29.6 pg (ref 26.0–34.0)
MCHC: 34.1 g/dL (ref 30.0–36.0)
MCV: 86.7 fL (ref 78.0–100.0)
Platelets: 221 10*3/uL (ref 150–400)
RBC: 4.67 MIL/uL (ref 3.87–5.11)
RDW: 14.2 % (ref 11.5–15.5)
WBC: 4.4 10*3/uL (ref 4.0–10.5)

## 2016-10-16 LAB — PROTIME-INR
INR: 0.93
Prothrombin Time: 12.5 seconds (ref 11.4–15.2)

## 2016-10-16 LAB — COMPREHENSIVE METABOLIC PANEL
ALK PHOS: 84 U/L (ref 38–126)
ALT: 19 U/L (ref 14–54)
AST: 26 U/L (ref 15–41)
Albumin: 4 g/dL (ref 3.5–5.0)
Anion gap: 7 (ref 5–15)
BILIRUBIN TOTAL: 0.5 mg/dL (ref 0.3–1.2)
BUN: 17 mg/dL (ref 6–20)
CALCIUM: 9.3 mg/dL (ref 8.9–10.3)
CO2: 26 mmol/L (ref 22–32)
CREATININE: 0.79 mg/dL (ref 0.44–1.00)
Chloride: 108 mmol/L (ref 101–111)
GFR calc Af Amer: >60 mL/min (ref >60)
GFR calc non Af Amer: >60 mL/min (ref >60)
GLUCOSE: 128 mg/dL — AB (ref 65–99)
Potassium: 3.8 mmol/L (ref 3.5–5.1)
Sodium: 141 mmol/L (ref 135–145)
TOTAL PROTEIN: 6.9 g/dL (ref 6.5–8.1)

## 2016-10-16 LAB — SURGICAL PCR SCREEN
MRSA, PCR: NEGATIVE
Staphylococcus aureus: NEGATIVE

## 2016-10-16 LAB — APTT: APTT: 32 s (ref 24–36)

## 2016-10-16 NOTE — Progress Notes (Addendum)
At pre-op appt, Jehovah's Witness patient had questions about Albumin. RN explained to best ability what albumin is; however patient insisted that she would like to do her own personal research on albumin and defer signing blood refusal from until then. RN explained that she would need to know by day of surgery whether or not she would accept albumin as she would need to complete the form at that time. Pt verbalized understanding . RN will make notation for short stay team to be ware patient needs to complete form. No Blood Products in FYI and allergy list was already documented from previous joint surgery in 2016 . RN has made no new updates since form has not been completed/updated by patient for upcoming surgery .

## 2016-10-20 ENCOUNTER — Ambulatory Visit: Payer: Self-pay | Admitting: Orthopedic Surgery

## 2016-10-20 NOTE — H&P (Signed)
Kimberly Mccullough DOB: 1945-12-21 Widowed / Language: English / Race: Black or African American Female Date of Admission:  10/22/2016 CC:  Right Knee Pain History of Present Illness The patient is a 71 year old female who comes in  for a preoperative History and Physical. The patient is scheduled for a right total knee arthroplasty to be performed by Dr. Gus RankinFrank V. Aluisio, MD at [facility] on 10-22-2016. The patient is a 71 year old female who presented for follow up of their knee. The patient is being followed for their right knee pain and osteoarthritis. Symptoms reported include: pain, pain at night, aching, stiffness, giving way, instability, pain with weightbearing and difficulty ambulating. The patient feels that they are doing poorly and report their pain level to be mild to moderate. Current treatment includes: NSAIDs (Mobic). The following medication has been used for pain control: antiinflammatory medication (Meloxicam). Patient had left TKA done in 2016. She does have occasional pain in this knee but overall doing well. Unfortunately, her right knee is getting progressively worse. The injections are no longer beneficial. The knee is hurting her at all times. It is as bad or worse than the left knee was when she had that replaced. She is at a stage now where she feels like she is going to need to get something done with it. She has tried injections and exercise without benefit. The altered gait pattern is affecting her back. She is ready to proceed with the right side at this time. They have been treated conservatively in the past for the above stated problem and despite conservative measures, they continue to have progressive pain and severe functional limitations and dysfunction. They have failed non-operative management including home exercise, medications, and injections. It is felt that they would benefit from undergoing total joint replacement. Risks and benefits of the procedure  have been discussed with the patient and they elect to proceed with surgery. There are no active contraindications to surgery such as ongoing infection or rapidly progressive neurological disease.  Problem List/Past Medical  Left knee pain (M25.562)  Primary osteoarthritis of one knee, right (M17.11)  Osteopenia  Varicose veins  Hypercholesterolemia  Menopause  Bronchitis  Tinnitus  Other disease, cancer, significant illness  Urinary tract infection, bacterial (N39.0)  Anxiety Disorder  Shingles  Hypertension   Allergies  Statins   Family History Cancer  Mother. Heart Disease  Maternal Grandmother.  Social History  Not under pain contract  Number of flights of stairs before winded  2-3 Children  3 Tobacco use  Never smoker. 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 Verapamil HCl ER (120MG  Tablet ER, Oral) Active. Aspirin EC (81MG  Tablet DR, Oral two times daily) Active. Calcium (500MG  Capsule, Oral) Active. Vitamin E (100U Capsule, Oral) Active. Glucosamine HCl (1000MG  Tablet, Oral) Active. Fish Oil (1000MG  Capsule, Oral) Active. Vitamin B12 (1000MCG Tablet ER, Oral) Active. Multi Vitamin (Oral) Active.  Past Surgical History Total Hip Replacement - Left  anterior approach Dilation and Curettage of Uterus  Foot Surgery  bilateral Hysterectomy  complete (non-cancerous) Gallbladder Surgery  open Total Knee Replacement - Left   Review of Systems  General Not Present- Chills, Fatigue, Fever, Memory Loss, Night Sweats, Weight Gain and Weight Loss. Skin  Present- Rash. Not Present- Eczema, Hives, Itching and Lesions. HEENT Not Present-  Dentures, Double Vision, Headache, Hearing Loss, Tinnitus and Visual Loss. Respiratory Not Present- Allergies, Chronic Cough, Coughing up blood, Shortness of breath at rest and Shortness of breath with exertion. Cardiovascular Not Present- Chest Pain, Difficulty Breathing Lying Down, Murmur, Palpitations, Racing/skipping heartbeats and Swelling. Gastrointestinal Not Present- Abdominal Pain, Bloody Stool, Constipation, Diarrhea, Difficulty Swallowing, Heartburn, Jaundice, Loss of appetitie, Nausea and Vomiting. Female Genitourinary Not Present- Blood in Urine, Discharge, Flank Pain, Incontinence, Painful Urination, Urgency, Urinary frequency, Urinary Retention, Urinating at Night and Weak urinary stream. Musculoskeletal Present- Back Pain, Joint Pain, Joint Swelling and Muscle Weakness. Not Present- Morning Stiffness, Muscle Pain and Spasms. Neurological Not Present- Blackout spells, Difficulty with balance, Dizziness, Paralysis, Tremor and Weakness. Psychiatric Not Present- Insomnia.  Vitals  Weight: 165 lb Height: 70in Weight was reported by patient. Height was reported by patient. Body Surface Area: 1.92 m Body Mass Index: 23.67 kg/m  Pulse: 84 (Regular)  BP: 132/84 (Sitting, Right Arm, Standard)   Physical Exam General Mental Status -Alert, cooperative and good historian. General Appearance-pleasant, Not in acute distress. Orientation-Oriented X3. Build & Nutrition-Well nourished and Well developed.  Head and Neck Head-normocephalic, atraumatic . Neck Global Assessment - supple, no bruit auscultated on the right, no bruit auscultated on the left.  Eye Pupil - Bilateral-Regular and Round. Motion - Bilateral-EOMI.  Chest and Lung Exam Auscultation Breath sounds - clear at anterior chest wall and clear at posterior chest wall. Adventitious sounds - No Adventitious sounds.  Cardiovascular Auscultation Rhythm - Regular rate and rhythm. Heart Sounds - S1  WNL and S2 WNL. Murmurs & Other Heart Sounds - Auscultation of the heart reveals - No Murmurs.  Abdomen Palpation/Percussion Tenderness - Abdomen is non-tender to palpation. Rigidity (guarding) - Abdomen is soft. Auscultation Auscultation of the abdomen reveals - Bowel sounds normal.  Female Genitourinary Note: Not done, not pertinent to present illness   Musculoskeletal Note: On exam, she is in no distress. Her right knee shows no effusion. Range is 5 to 120. She has marked crepitus on range of motion. She is tender medial and lateral with no instability.  RADIOGRAPHS Her x-rays show tricompartmental bone-on-bone disease, worse lateral and patellofemoral.  Assessment & Plan  Primary osteoarthritis of one knee, right (M17.11)  Note:Surgical Plans: Right Total Knee Replacement  Disposition: Home with son  PCP: Dr. Eliott Nine  IV TXA  Anesthesia Issues: None  Patient was instructed on what medications to stop prior to surgery.  Signed electronically by Lauraine Rinne, III PA-C

## 2016-10-22 ENCOUNTER — Inpatient Hospital Stay (HOSPITAL_COMMUNITY)
Admission: RE | Admit: 2016-10-22 | Discharge: 2016-10-24 | DRG: 470 | Disposition: A | Discharge status: Home Health | Payer: Medicare Other | Source: Ambulatory Visit | Attending: Orthopedic Surgery | Admitting: Orthopedic Surgery

## 2016-10-22 ENCOUNTER — Inpatient Hospital Stay (HOSPITAL_COMMUNITY): Payer: Medicare Other | Admitting: Anesthesiology

## 2016-10-22 ENCOUNTER — Encounter (HOSPITAL_COMMUNITY): Payer: Self-pay

## 2016-10-22 ENCOUNTER — Encounter (HOSPITAL_COMMUNITY)
Admission: RE | Disposition: A | Discharge status: Home Health | Payer: Self-pay | Source: Ambulatory Visit | Attending: Orthopedic Surgery

## 2016-10-22 DIAGNOSIS — Z7982 Long term (current) use of aspirin: Secondary | ICD-10-CM

## 2016-10-22 DIAGNOSIS — Z8249 Family history of ischemic heart disease and other diseases of the circulatory system: Secondary | ICD-10-CM | POA: Diagnosis not present

## 2016-10-22 DIAGNOSIS — M1710 Unilateral primary osteoarthritis, unspecified knee: Secondary | ICD-10-CM | POA: Diagnosis present

## 2016-10-22 DIAGNOSIS — Z96652 Presence of left artificial knee joint: Secondary | ICD-10-CM | POA: Diagnosis present

## 2016-10-22 DIAGNOSIS — M179 Osteoarthritis of knee, unspecified: Secondary | ICD-10-CM | POA: Diagnosis present

## 2016-10-22 DIAGNOSIS — Z79899 Other long term (current) drug therapy: Secondary | ICD-10-CM | POA: Diagnosis not present

## 2016-10-22 DIAGNOSIS — M1711 Unilateral primary osteoarthritis, right knee: Secondary | ICD-10-CM | POA: Diagnosis present

## 2016-10-22 DIAGNOSIS — Z90710 Acquired absence of both cervix and uterus: Secondary | ICD-10-CM | POA: Diagnosis not present

## 2016-10-22 DIAGNOSIS — I10 Essential (primary) hypertension: Secondary | ICD-10-CM | POA: Diagnosis present

## 2016-10-22 DIAGNOSIS — Z96642 Presence of left artificial hip joint: Secondary | ICD-10-CM | POA: Diagnosis present

## 2016-10-22 DIAGNOSIS — M8580 Other specified disorders of bone density and structure, unspecified site: Secondary | ICD-10-CM | POA: Diagnosis present

## 2016-10-22 DIAGNOSIS — Z791 Long term (current) use of non-steroidal anti-inflammatories (NSAID): Secondary | ICD-10-CM

## 2016-10-22 HISTORY — PX: TOTAL KNEE ARTHROPLASTY: SHX125

## 2016-10-22 SURGERY — ARTHROPLASTY, KNEE, TOTAL
Anesthesia: Spinal | Site: Knee | Laterality: Right

## 2016-10-22 MED ORDER — GABAPENTIN 300 MG PO CAPS
300.0000 mg | ORAL_CAPSULE | Freq: Once | ORAL | Status: AC
  Administered 2016-10-22: 300 mg via ORAL

## 2016-10-22 MED ORDER — BUPIVACAINE LIPOSOME 1.3 % IJ SUSP
20.0000 mL | Freq: Once | INTRAMUSCULAR | Status: DC
  Filled 2016-10-22: qty 20

## 2016-10-22 MED ORDER — SODIUM CHLORIDE 0.9 % IJ SOLN
INTRAMUSCULAR | Status: DC | PRN
  Administered 2016-10-22 (×2): 30 mL

## 2016-10-22 MED ORDER — ACETAMINOPHEN 500 MG PO TABS
1000.0000 mg | ORAL_TABLET | Freq: Four times a day (QID) | ORAL | Status: AC
  Administered 2016-10-22 – 2016-10-23 (×4): 1000 mg via ORAL
  Filled 2016-10-22 (×4): qty 2

## 2016-10-22 MED ORDER — POLYETHYLENE GLYCOL 3350 17 G PO PACK: 17.0000 g | PACK | Freq: Every day | ORAL | Status: DC | PRN

## 2016-10-22 MED ORDER — FENTANYL CITRATE (PF) 100 MCG/2ML IJ SOLN
25.0000 ug | INTRAMUSCULAR | Status: DC | PRN
Start: 2016-10-22 — End: 2016-10-22

## 2016-10-22 MED ORDER — CHLORHEXIDINE GLUCONATE 4 % EX LIQD: 60.0000 mL | Freq: Once | CUTANEOUS | Status: DC

## 2016-10-22 MED ORDER — LIDOCAINE 2% (20 MG/ML) 5 ML SYRINGE
INTRAMUSCULAR | Status: DC | PRN
  Administered 2016-10-22: 60 mg via INTRAVENOUS

## 2016-10-22 MED ORDER — ONDANSETRON HCL 4 MG/2ML IJ SOLN
INTRAMUSCULAR | Status: AC
  Filled 2016-10-22: qty 2

## 2016-10-22 MED ORDER — ACETAMINOPHEN 10 MG/ML IV SOLN
INTRAVENOUS | Status: AC
  Filled 2016-10-22: qty 100

## 2016-10-22 MED ORDER — TRAMADOL HCL 50 MG PO TABS: 50.0000 mg | ORAL_TABLET | Freq: Four times a day (QID) | ORAL | Status: DC | PRN

## 2016-10-22 MED ORDER — ACETAMINOPHEN 650 MG RE SUPP: 650.0000 mg | Freq: Four times a day (QID) | RECTAL | Status: DC | PRN

## 2016-10-22 MED ORDER — MENTHOL 3 MG MT LOZG: 1.0000 | LOZENGE | OROMUCOSAL | Status: DC | PRN

## 2016-10-22 MED ORDER — DEXAMETHASONE SODIUM PHOSPHATE 10 MG/ML IJ SOLN
10.0000 mg | Freq: Once | INTRAMUSCULAR | Status: AC
  Administered 2016-10-22: 10 mg via INTRAVENOUS

## 2016-10-22 MED ORDER — ONDANSETRON HCL 4 MG/2ML IJ SOLN: 4.0000 mg | Freq: Four times a day (QID) | INTRAMUSCULAR | Status: DC | PRN

## 2016-10-22 MED ORDER — TRANEXAMIC ACID 1000 MG/10ML IV SOLN
1000.0000 mg | Freq: Once | INTRAVENOUS | Status: AC
  Administered 2016-10-22: 21:00:00 1000 mg via INTRAVENOUS
  Filled 2016-10-22: qty 1100
  Filled 2016-10-22: qty 10

## 2016-10-22 MED ORDER — DOCUSATE SODIUM 100 MG PO CAPS
100.0000 mg | ORAL_CAPSULE | Freq: Two times a day (BID) | ORAL | Status: DC
  Administered 2016-10-22 – 2016-10-24 (×4): 100 mg via ORAL
  Filled 2016-10-22 (×4): qty 1

## 2016-10-22 MED ORDER — ROPIVACAINE HCL 5 MG/ML IJ SOLN
INTRAMUSCULAR | Status: DC | PRN
  Administered 2016-10-22: 20 mL via PERINEURAL

## 2016-10-22 MED ORDER — MORPHINE SULFATE (PF) 4 MG/ML IV SOLN
1.0000 mg | INTRAVENOUS | Status: DC | PRN
  Administered 2016-10-24: 1 mg via INTRAVENOUS
  Filled 2016-10-22: qty 1

## 2016-10-22 MED ORDER — PROPOFOL 500 MG/50ML IV EMUL
INTRAVENOUS | Status: DC | PRN
  Administered 2016-10-22: 50 ug/kg/min via INTRAVENOUS

## 2016-10-22 MED ORDER — 0.9 % SODIUM CHLORIDE (POUR BTL) OPTIME
TOPICAL | Status: DC | PRN
  Administered 2016-10-22: 1000 mL

## 2016-10-22 MED ORDER — MIDAZOLAM HCL 2 MG/2ML IJ SOLN
2.0000 mg | Freq: Once | INTRAMUSCULAR | Status: AC
  Administered 2016-10-22: 2 mg via INTRAVENOUS

## 2016-10-22 MED ORDER — METOCLOPRAMIDE HCL 5 MG/ML IJ SOLN: 5.0000 mg | Freq: Three times a day (TID) | INTRAMUSCULAR | Status: DC | PRN

## 2016-10-22 MED ORDER — SODIUM CHLORIDE 0.9 % IV SOLN
INTRAVENOUS | Status: DC
  Administered 2016-10-22: 21:00:00 via INTRAVENOUS

## 2016-10-22 MED ORDER — METHOCARBAMOL 500 MG PO TABS
500.0000 mg | ORAL_TABLET | Freq: Four times a day (QID) | ORAL | Status: DC | PRN
  Administered 2016-10-23 – 2016-10-24 (×5): 500 mg via ORAL
  Filled 2016-10-22 (×5): qty 1

## 2016-10-22 MED ORDER — MEPERIDINE HCL 50 MG/ML IJ SOLN: 6.2500 mg | INTRAMUSCULAR | Status: DC | PRN

## 2016-10-22 MED ORDER — GABAPENTIN 300 MG PO CAPS
ORAL_CAPSULE | ORAL | Status: AC
  Administered 2016-10-22: 300 mg via ORAL
  Filled 2016-10-22: qty 1

## 2016-10-22 MED ORDER — CEFAZOLIN SODIUM-DEXTROSE 2-4 GM/100ML-% IV SOLN
2.0000 g | INTRAVENOUS | Status: AC
  Administered 2016-10-22: 2 g via INTRAVENOUS

## 2016-10-22 MED ORDER — PROMETHAZINE HCL 25 MG/ML IJ SOLN: 12.5000 mg | Freq: Once | INTRAMUSCULAR | Status: DC | PRN

## 2016-10-22 MED ORDER — FENTANYL CITRATE (PF) 100 MCG/2ML IJ SOLN
100.0000 ug | Freq: Once | INTRAMUSCULAR | Status: AC
  Administered 2016-10-22: 50 ug via INTRAVENOUS

## 2016-10-22 MED ORDER — ACETAMINOPHEN 10 MG/ML IV SOLN
1000.0000 mg | Freq: Once | INTRAVENOUS | Status: AC
  Administered 2016-10-22: 1000 mg via INTRAVENOUS

## 2016-10-22 MED ORDER — DEXAMETHASONE SODIUM PHOSPHATE 10 MG/ML IJ SOLN
INTRAMUSCULAR | Status: AC
  Filled 2016-10-22: qty 1

## 2016-10-22 MED ORDER — METHOCARBAMOL 1000 MG/10ML IJ SOLN
500.0000 mg | Freq: Four times a day (QID) | INTRAVENOUS | Status: DC | PRN
  Administered 2016-10-22: 500 mg via INTRAVENOUS
  Filled 2016-10-22: qty 550

## 2016-10-22 MED ORDER — MIDAZOLAM HCL 2 MG/2ML IJ SOLN
INTRAMUSCULAR | Status: AC
  Administered 2016-10-22: 2 mg via INTRAVENOUS
  Filled 2016-10-22: qty 2

## 2016-10-22 MED ORDER — LACTATED RINGERS IV SOLN
INTRAVENOUS | Status: DC
  Administered 2016-10-22: 1000 mL via INTRAVENOUS
  Administered 2016-10-22: 16:00:00 via INTRAVENOUS

## 2016-10-22 MED ORDER — ONDANSETRON HCL 4 MG/2ML IJ SOLN
INTRAMUSCULAR | Status: DC | PRN
  Administered 2016-10-22: 4 mg via INTRAVENOUS

## 2016-10-22 MED ORDER — TRANEXAMIC ACID 1000 MG/10ML IV SOLN
1000.0000 mg | INTRAVENOUS | Status: AC
  Administered 2016-10-22: 1000 mg via INTRAVENOUS
  Filled 2016-10-22: qty 1100

## 2016-10-22 MED ORDER — SODIUM CHLORIDE 0.9 % IJ SOLN
INTRAMUSCULAR | Status: AC
  Filled 2016-10-22: qty 10

## 2016-10-22 MED ORDER — FLEET ENEMA 7-19 GM/118ML RE ENEM: 1.0000 | ENEMA | Freq: Once | RECTAL | Status: DC | PRN

## 2016-10-22 MED ORDER — BUPIVACAINE LIPOSOME 1.3 % IJ SUSP
INTRAMUSCULAR | Status: DC | PRN
  Administered 2016-10-22 (×2): 10 mL

## 2016-10-22 MED ORDER — OXYCODONE HCL 5 MG PO TABS
5.0000 mg | ORAL_TABLET | ORAL | Status: DC | PRN
  Administered 2016-10-22: 21:00:00 5 mg via ORAL
  Administered 2016-10-23 – 2016-10-24 (×7): 10 mg via ORAL
  Filled 2016-10-22: qty 1
  Filled 2016-10-22 (×7): qty 2

## 2016-10-22 MED ORDER — CEFAZOLIN SODIUM-DEXTROSE 2-4 GM/100ML-% IV SOLN
INTRAVENOUS | Status: AC
  Filled 2016-10-22: qty 100

## 2016-10-22 MED ORDER — CEFAZOLIN SODIUM-DEXTROSE 2-4 GM/100ML-% IV SOLN
2.0000 g | Freq: Four times a day (QID) | INTRAVENOUS | Status: AC
  Administered 2016-10-22 – 2016-10-23 (×2): 2 g via INTRAVENOUS
  Filled 2016-10-22 (×2): qty 100

## 2016-10-22 MED ORDER — ACETAMINOPHEN 325 MG PO TABS
650.0000 mg | ORAL_TABLET | Freq: Four times a day (QID) | ORAL | Status: DC | PRN
Start: 2016-10-23 — End: 2016-10-24

## 2016-10-22 MED ORDER — METOCLOPRAMIDE HCL 5 MG PO TABS: 5.0000 mg | ORAL_TABLET | Freq: Three times a day (TID) | ORAL | Status: DC | PRN

## 2016-10-22 MED ORDER — LIDOCAINE 2% (20 MG/ML) 5 ML SYRINGE
INTRAMUSCULAR | Status: AC
  Filled 2016-10-22: qty 5

## 2016-10-22 MED ORDER — DEXAMETHASONE SODIUM PHOSPHATE 10 MG/ML IJ SOLN
10.0000 mg | Freq: Once | INTRAMUSCULAR | Status: AC
  Administered 2016-10-23: 10 mg via INTRAVENOUS
  Filled 2016-10-22: qty 1

## 2016-10-22 MED ORDER — EPHEDRINE SULFATE 50 MG/ML IJ SOLN
INTRAMUSCULAR | Status: DC | PRN
  Administered 2016-10-22 (×2): 10 mg via INTRAVENOUS

## 2016-10-22 MED ORDER — DIPHENHYDRAMINE HCL 12.5 MG/5ML PO ELIX: 12.5000 mg | ORAL_SOLUTION | ORAL | Status: DC | PRN

## 2016-10-22 MED ORDER — SODIUM CHLORIDE 0.9 % IR SOLN
Status: DC | PRN
  Administered 2016-10-22: 1000 mL

## 2016-10-22 MED ORDER — BISACODYL 10 MG RE SUPP: 10.0000 mg | Freq: Every day | RECTAL | Status: DC | PRN

## 2016-10-22 MED ORDER — PROPOFOL 10 MG/ML IV BOLUS
INTRAVENOUS | Status: DC | PRN
  Administered 2016-10-22: 30 mg via INTRAVENOUS

## 2016-10-22 MED ORDER — BUPIVACAINE IN DEXTROSE 0.75-8.25 % IT SOLN
INTRATHECAL | Status: DC | PRN
  Administered 2016-10-22: 12 mg via INTRATHECAL

## 2016-10-22 MED ORDER — APIXABAN 2.5 MG PO TABS
2.5000 mg | ORAL_TABLET | Freq: Two times a day (BID) | ORAL | Status: DC
  Administered 2016-10-23 – 2016-10-24 (×3): 2.5 mg via ORAL
  Filled 2016-10-22 (×3): qty 1

## 2016-10-22 MED ORDER — STERILE WATER FOR IRRIGATION IR SOLN
Status: DC | PRN
  Administered 2016-10-22: 2000 mL

## 2016-10-22 MED ORDER — ONDANSETRON HCL 4 MG PO TABS: 4.0000 mg | ORAL_TABLET | Freq: Four times a day (QID) | ORAL | Status: DC | PRN

## 2016-10-22 MED ORDER — EPHEDRINE 5 MG/ML INJ
INTRAVENOUS | Status: AC
  Filled 2016-10-22: qty 10

## 2016-10-22 MED ORDER — PHENOL 1.4 % MT LIQD
1.0000 | OROMUCOSAL | Status: DC | PRN
  Filled 2016-10-22: qty 177

## 2016-10-22 MED ORDER — FENTANYL CITRATE (PF) 100 MCG/2ML IJ SOLN
INTRAMUSCULAR | Status: AC
  Administered 2016-10-22: 50 ug via INTRAVENOUS
  Filled 2016-10-22: qty 2

## 2016-10-22 MED ORDER — VERAPAMIL HCL ER 120 MG PO TBCR
120.0000 mg | EXTENDED_RELEASE_TABLET | Freq: Two times a day (BID) | ORAL | Status: DC
  Administered 2016-10-22 – 2016-10-24 (×4): 120 mg via ORAL
  Filled 2016-10-22 (×7): qty 1

## 2016-10-22 MED ORDER — SODIUM CHLORIDE 0.9 % IJ SOLN
INTRAMUSCULAR | Status: AC
  Filled 2016-10-22: qty 50

## 2016-10-22 SURGICAL SUPPLY — 51 items
BAG DECANTER FOR FLEXI CONT (MISCELLANEOUS) ×1 IMPLANT
BAG SPEC THK2 15X12 ZIP CLS (MISCELLANEOUS) ×1
BAG ZIPLOCK 12X15 (MISCELLANEOUS) ×3 IMPLANT
BANDAGE ACE 6X5 VEL STRL LF (GAUZE/BANDAGES/DRESSINGS) ×3 IMPLANT
BLADE SAG 18X100X1.27 (BLADE) ×3 IMPLANT
BLADE SAW SGTL 11.0X1.19X90.0M (BLADE) ×3 IMPLANT
BOWL SMART MIX CTS (DISPOSABLE) ×3 IMPLANT
CAPT KNEE TOTAL 3 ATTUNE ×2 IMPLANT
CEMENT HV SMART SET (Cement) ×6 IMPLANT
CLOSURE WOUND 1/2 X4 (GAUZE/BANDAGES/DRESSINGS) ×2
COVER SURGICAL LIGHT HANDLE (MISCELLANEOUS) ×3 IMPLANT
CUFF TOURN SGL QUICK 34 (TOURNIQUET CUFF) ×3
CUFF TRNQT CYL 34X4X40X1 (TOURNIQUET CUFF) ×1 IMPLANT
DECANTER SPIKE VIAL GLASS SM (MISCELLANEOUS) ×3 IMPLANT
DRAPE U-SHAPE 47X51 STRL (DRAPES) ×3 IMPLANT
DRSG ADAPTIC 3X8 NADH LF (GAUZE/BANDAGES/DRESSINGS) ×3 IMPLANT
DRSG PAD ABDOMINAL 8X10 ST (GAUZE/BANDAGES/DRESSINGS) ×3 IMPLANT
DURAPREP 26ML APPLICATOR (WOUND CARE) ×3 IMPLANT
ELECT REM PT RETURN 15FT ADLT (MISCELLANEOUS) ×3 IMPLANT
EVACUATOR 1/8 PVC DRAIN (DRAIN) ×3 IMPLANT
GAUZE SPONGE 4X4 12PLY STRL (GAUZE/BANDAGES/DRESSINGS) ×3 IMPLANT
GLOVE BIO SURGEON STRL SZ7.5 (GLOVE) ×2 IMPLANT
GLOVE BIO SURGEON STRL SZ8 (GLOVE) ×3 IMPLANT
GLOVE BIOGEL PI IND STRL 6.5 (GLOVE) IMPLANT
GLOVE BIOGEL PI IND STRL 8 (GLOVE) ×1 IMPLANT
GLOVE BIOGEL PI INDICATOR 6.5 (GLOVE)
GLOVE BIOGEL PI INDICATOR 8 (GLOVE) ×4
GLOVE SURG SS PI 6.5 STRL IVOR (GLOVE) IMPLANT
GOWN STRL REUS W/TWL LRG LVL3 (GOWN DISPOSABLE) ×3 IMPLANT
GOWN STRL REUS W/TWL XL LVL3 (GOWN DISPOSABLE) ×2 IMPLANT
HANDPIECE INTERPULSE COAX TIP (DISPOSABLE) ×3
IMMOBILIZER KNEE 20 (SOFTGOODS) ×3
IMMOBILIZER KNEE 20 THIGH 36 (SOFTGOODS) ×1 IMPLANT
MANIFOLD NEPTUNE II (INSTRUMENTS) ×3 IMPLANT
NS IRRIG 1000ML POUR BTL (IV SOLUTION) ×3 IMPLANT
PACK TOTAL KNEE CUSTOM (KITS) ×3 IMPLANT
PADDING CAST COTTON 6X4 STRL (CAST SUPPLIES) ×7 IMPLANT
POSITIONER SURGICAL ARM (MISCELLANEOUS) ×3 IMPLANT
SET HNDPC FAN SPRY TIP SCT (DISPOSABLE) ×1 IMPLANT
STRIP CLOSURE SKIN 1/2X4 (GAUZE/BANDAGES/DRESSINGS) ×4 IMPLANT
SUT MNCRL AB 4-0 PS2 18 (SUTURE) ×3 IMPLANT
SUT STRATAFIX 0 PDS 27 VIOLET (SUTURE) ×3
SUT VIC AB 2-0 CT1 27 (SUTURE) ×9
SUT VIC AB 2-0 CT1 TAPERPNT 27 (SUTURE) ×3 IMPLANT
SUTURE STRATFX 0 PDS 27 VIOLET (SUTURE) ×1 IMPLANT
SYR 30ML LL (SYRINGE) ×6 IMPLANT
TRAY FOLEY CATH 14FRSI W/METER (CATHETERS) ×2 IMPLANT
TRAY FOLEY W/METER SILVER 16FR (SET/KITS/TRAYS/PACK) ×1 IMPLANT
WATER STERILE IRR 1000ML POUR (IV SOLUTION) ×6 IMPLANT
WRAP KNEE MAXI GEL POST OP (GAUZE/BANDAGES/DRESSINGS) ×3 IMPLANT
YANKAUER SUCT BULB TIP 10FT TU (MISCELLANEOUS) ×3 IMPLANT

## 2016-10-22 NOTE — Anesthesia Procedure Notes (Signed)
Anesthesia Regional Block: Adductor canal block   Pre-Anesthetic Checklist: ,, timeout performed, Correct Patient, Correct Site, Correct Laterality, Correct Procedure, Correct Position, site marked, Risks and benefits discussed,  Surgical consent,  Pre-op evaluation,  At surgeon's request and post-op pain management  Laterality: Left  Prep: Dura Prep       Needles:  Injection technique: Single-shot  Needle Type: Stimiplex     Needle Length: 9cm  Needle Gauge: 21     Additional Needles:   Procedures: ultrasound guided,,,,,,,,  Narrative:  Start time: 10/22/2016 2:50 PM End time: 10/22/2016 3:00 PM Injection made incrementally with aspirations every 5 mL. Anesthesiologist: Talan Gildner

## 2016-10-22 NOTE — Transfer of Care (Signed)
Immediate Anesthesia Transfer of Care Note  Patient: [place]  Procedure(s) Performed: Procedure(s): RIGHT TOTAL KNEE ARTHROPLASTY (Right)  Patient Location: PACU  Anesthesia Type:MAC, Regional and Spinal  Level of Consciousness: awake and patient cooperative  Airway & Oxygen Therapy: Patient Spontanous Breathing and Patient connected to face mask oxygen  Post-op Assessment: Report given to RN and Post -op Vital signs reviewed and stable  Post vital signs: Reviewed and stable  Last Vitals:  Vitals:   10/22/16 1453 10/22/16 1454  BP:    Pulse: 73 72  Resp: 13 14  Temp:      Last Pain:  Vitals:   10/22/16 1238  TempSrc: Oral         Complications: No apparent anesthesia complications

## 2016-10-22 NOTE — Progress Notes (Signed)
AssistedDr. Oddono with right, ultrasound guided, adductor canal block. Side rails up, monitors on throughout procedure. See vital signs in flow sheet. Tolerated Procedure well.  

## 2016-10-22 NOTE — Op Note (Signed)
OPERATIVE REPORT-TOTAL KNEE ARTHROPLASTY   Pre-operative diagnosis- Osteoarthritis  Right knee(s)  Post-operative diagnosis- Osteoarthritis Right knee(s)  Procedure-  Right  Total Knee Arthroplasty  Surgeon- Gus Rankin. Jevonte Clanton, MD  Assistant- Avel Peace, PA-C   Anesthesia-  Adductor canal block and spinal  EBL-* No blood loss amount entered *   Drains Hemovac  Tourniquet time- 34 minutes @ 300 mm Hg  Complications- None  Condition-PACU - hemodynamically stable.   Brief Clinical Note  Kimberly Mccullough is a 71 y.o. year old female with end stage OA of her right knee with progressively worsening pain and dysfunction. She has constant pain, with activity and at rest and significant functional deficits with difficulties even with ADLs. She has had extensive non-op management including analgesics, injections of cortisone and viscosupplements, and home exercise program, but remains in significant pain with significant dysfunction.Radiographs show bone on bone arthritis lateral and patellofemoral. She presents now for right Total Knee Arthroplasty.    Procedure in detail---   The patient is brought into the operating room and positioned supine on the operating table. After successful administration of  Adductor canal block and spinal,   a tourniquet is placed high on the  Right thigh(s) and the lower extremity is prepped and draped in the usual sterile fashion. Time out is performed by the operating team and then the  Right lower extremity is wrapped in Esmarch, knee flexed and the tourniquet inflated to 300 mmHg.       A midline incision is made with a ten blade through the subcutaneous tissue to the level of the extensor mechanism. A fresh blade is used to make a medial parapatellar arthrotomy. Soft tissue over the proximal medial tibia is subperiosteally elevated to the joint line with a knife and into the semimembranosus bursa with a Cobb elevator. Soft tissue over the proximal  lateral tibia is elevated with attention being paid to avoiding the patellar tendon on the tibial tubercle. The patella is everted, knee flexed 90 degrees and the ACL and PCL are removed. Findings are bone on bone lateral and patellofemoral with large lateral osteophytes.        The drill is used to create a starting hole in the distal femur and the canal is thoroughly irrigated with sterile saline to remove the fatty contents. The 5 degree Right  valgus alignment guide is placed into the femoral canal and the distal femoral cutting block is pinned to remove 9 mm off the distal femur. Resection is made with an oscillating saw.      The tibia is subluxed forward and the menisci are removed. The extramedullary alignment guide is placed referencing proximally at the medial aspect of the tibial tubercle and distally along the second metatarsal axis and tibial crest. The block is pinned to remove 2mm off the more deficient lateral  side. Resection is made with an oscillating saw. Size 6is the most appropriate size for the tibia and the proximal tibia is prepared with the modular drill and keel punch for that size.      The femoral sizing guide is placed and size 6 is most appropriate. Rotation is marked off the epicondylar axis and confirmed by creating a rectangular flexion gap at 90 degrees. The size 6 cutting block is pinned in this rotation and the anterior, posterior and chamfer cuts are made with the oscillating saw. The intercondylar block is then placed and that cut is made.      Trial size 6 tibial component,  trial size 6 posterior stabilized femur and a 10  mm posterior stabilized rotating platform insert trial is placed. Full extension is achieved with excellent varus/valgus and anterior/posterior balance throughout full range of motion. The patella is everted and thickness measured to be 22  mm. Free hand resection is taken to 12 mm, a 35 template is placed, lug holes are drilled, trial patella is placed,  and it tracks normally. Osteophytes are removed off the posterior femur with the trial in place. All trials are removed and the cut bone surfaces prepared with pulsatile lavage. Cement is mixed and once ready for implantation, the size 6 tibial implant, size  6 posterior stabilized femoral component, and the size 35 patella are cemented in place and the patella is held with the clamp. The trial insert is placed and the knee held in full extension. The Exparel (20 ml mixed with 60 ml saline) is injected into the extensor mechanism, posterior capsule, medial and lateral gutters and subcutaneous tissues.  All extruded cement is removed and once the cement is hard the permanent 10 mm posterior stabilized rotating platform insert is placed into the tibial tray.      The wound is copiously irrigated with saline solution and the extensor mechanism closed over a hemovac drain with #1 V-loc suture. The tourniquet is released for a total tourniquet time of 34  minutes. Flexion against gravity is 140 degrees and the patella tracks normally. Subcutaneous tissue is closed with 2.0 vicryl and subcuticular with running 4.0 Monocryl. The incision is cleaned and dried and steri-strips and a bulky sterile dressing are applied. The limb is placed into a knee immobilizer and the patient is awakened and transported to recovery in stable condition.      Please note that a surgical assistant was a medical necessity for this procedure in order to perform it in a safe and expeditious manner. Surgical assistant was necessary to retract the ligaments and vital neurovascular structures to prevent injury to them and also necessary for proper positioning of the limb to allow for anatomic placement of the prosthesis.   Gus RankinFrank V. Jonet Mathies, MD    10/22/2016, 4:25 PM

## 2016-10-22 NOTE — Anesthesia Procedure Notes (Signed)
Spinal  Patient location during procedure: OR Start time: 10/22/2016 3:20 PM End time: 10/22/2016 3:30 PM Staffing Anesthesiologist: Myranda Pavone Preanesthetic Checklist Completed: patient identified, site marked, surgical consent, pre-op evaluation, timeout performed, IV checked, risks and benefits discussed and monitors and equipment checked Spinal Block Patient position: sitting Prep: DuraPrep Patient monitoring: heart rate and continuous pulse ox Approach: midline Location: L4-5 Injection technique: single-shot Needle Needle type: Sprotte  Needle gauge: 24 G Needle length: 9 cm Needle insertion depth: 6 cm Assessment Sensory level: T10

## 2016-10-22 NOTE — Interval H&P Note (Signed)
History and Physical Interval Note:  10/22/2016 2:16 PM  Kimberly Mccullough  has presented today for surgery, with the diagnosis of osteoarthritis Right Knee  The various methods of treatment have been discussed with the patient and family. After consideration of risks, benefits and other options for treatment, the patient has consented to  Procedure(s): RIGHT TOTAL KNEE ARTHROPLASTY (Right) as a surgical intervention .  The patient's history has been reviewed, patient examined, no change in status, stable for surgery.  I have reviewed the patient's chart and labs.  Questions were answered to the patient's satisfaction.     Loanne DrillingALUISIO,Gwin Eagon V

## 2016-10-22 NOTE — H&P (View-Only) (Signed)
Kimberly Mccullough DOB: 1945-12-21 Widowed / Language: English / Race: Black or African American Female Date of Admission:  10/22/2016 CC:  Right Knee Pain History of Present Illness The patient is a 71 year old female who comes in  for a preoperative History and Physical. The patient is scheduled for a right total knee arthroplasty to be performed by Dr. Gus RankinFrank V. Aluisio, MD at [facility] on 10-22-2016. The patient is a 71 year old female who presented for follow up of their knee. The patient is being followed for their right knee pain and osteoarthritis. Symptoms reported include: pain, pain at night, aching, stiffness, giving way, instability, pain with weightbearing and difficulty ambulating. The patient feels that they are doing poorly and report their pain level to be mild to moderate. Current treatment includes: NSAIDs (Mobic). The following medication has been used for pain control: antiinflammatory medication (Meloxicam). Patient had left TKA done in 2016. She does have occasional pain in this knee but overall doing well. Unfortunately, her right knee is getting progressively worse. The injections are no longer beneficial. The knee is hurting her at all times. It is as bad or worse than the left knee was when she had that replaced. She is at a stage now where she feels like she is going to need to get something done with it. She has tried injections and exercise without benefit. The altered gait pattern is affecting her back. She is ready to proceed with the right side at this time. They have been treated conservatively in the past for the above stated problem and despite conservative measures, they continue to have progressive pain and severe functional limitations and dysfunction. They have failed non-operative management including home exercise, medications, and injections. It is felt that they would benefit from undergoing total joint replacement. Risks and benefits of the procedure  have been discussed with the patient and they elect to proceed with surgery. There are no active contraindications to surgery such as ongoing infection or rapidly progressive neurological disease.  Problem List/Past Medical  Left knee pain (M25.562)  Primary osteoarthritis of one knee, right (M17.11)  Osteopenia  Varicose veins  Hypercholesterolemia  Menopause  Bronchitis  Tinnitus  Other disease, cancer, significant illness  Urinary tract infection, bacterial (N39.0)  Anxiety Disorder  Shingles  Hypertension   Allergies  Statins   Family History Cancer  Mother. Heart Disease  Maternal Grandmother.  Social History  Not under pain contract  Number of flights of stairs before winded  2-3 Children  3 Tobacco use  Never smoker. 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 Verapamil HCl ER (120MG  Tablet ER, Oral) Active. Aspirin EC (81MG  Tablet DR, Oral two times daily) Active. Calcium (500MG  Capsule, Oral) Active. Vitamin E (100U Capsule, Oral) Active. Glucosamine HCl (1000MG  Tablet, Oral) Active. Fish Oil (1000MG  Capsule, Oral) Active. Vitamin B12 (1000MCG Tablet ER, Oral) Active. Multi Vitamin (Oral) Active.  Past Surgical History Total Hip Replacement - Left  anterior approach Dilation and Curettage of Uterus  Foot Surgery  bilateral Hysterectomy  complete (non-cancerous) Gallbladder Surgery  open Total Knee Replacement - Left   Review of Systems  General Not Present- Chills, Fatigue, Fever, Memory Loss, Night Sweats, Weight Gain and Weight Loss. Skin  Present- Rash. Not Present- Eczema, Hives, Itching and Lesions. HEENT Not Present-  Dentures, Double Vision, Headache, Hearing Loss, Tinnitus and Visual Loss. Respiratory Not Present- Allergies, Chronic Cough, Coughing up blood, Shortness of breath at rest and Shortness of breath with exertion. Cardiovascular Not Present- Chest Pain, Difficulty Breathing Lying Down, Murmur, Palpitations, Racing/skipping heartbeats and Swelling. Gastrointestinal Not Present- Abdominal Pain, Bloody Stool, Constipation, Diarrhea, Difficulty Swallowing, Heartburn, Jaundice, Loss of appetitie, Nausea and Vomiting. Female Genitourinary Not Present- Blood in Urine, Discharge, Flank Pain, Incontinence, Painful Urination, Urgency, Urinary frequency, Urinary Retention, Urinating at Night and Weak urinary stream. Musculoskeletal Present- Back Pain, Joint Pain, Joint Swelling and Muscle Weakness. Not Present- Morning Stiffness, Muscle Pain and Spasms. Neurological Not Present- Blackout spells, Difficulty with balance, Dizziness, Paralysis, Tremor and Weakness. Psychiatric Not Present- Insomnia.  Vitals  Weight: 165 lb Height: 70in Weight was reported by patient. Height was reported by patient. Body Surface Area: 1.92 m Body Mass Index: 23.67 kg/m  Pulse: 84 (Regular)  BP: 132/84 (Sitting, Right Arm, Standard)   Physical Exam General Mental Status -Alert, cooperative and good historian. General Appearance-pleasant, Not in acute distress. Orientation-Oriented X3. Build & Nutrition-Well nourished and Well developed.  Head and Neck Head-normocephalic, atraumatic . Neck Global Assessment - supple, no bruit auscultated on the right, no bruit auscultated on the left.  Eye Pupil - Bilateral-Regular and Round. Motion - Bilateral-EOMI.  Chest and Lung Exam Auscultation Breath sounds - clear at anterior chest wall and clear at posterior chest wall. Adventitious sounds - No Adventitious sounds.  Cardiovascular Auscultation Rhythm - Regular rate and rhythm. Heart Sounds - S1  WNL and S2 WNL. Murmurs & Other Heart Sounds - Auscultation of the heart reveals - No Murmurs.  Abdomen Palpation/Percussion Tenderness - Abdomen is non-tender to palpation. Rigidity (guarding) - Abdomen is soft. Auscultation Auscultation of the abdomen reveals - Bowel sounds normal.  Female Genitourinary Note: Not done, not pertinent to present illness   Musculoskeletal Note: On exam, she is in no distress. Her right knee shows no effusion. Range is 5 to 120. She has marked crepitus on range of motion. She is tender medial and lateral with no instability.  RADIOGRAPHS Her x-rays show tricompartmental bone-on-bone disease, worse lateral and patellofemoral.  Assessment & Plan  Primary osteoarthritis of one knee, right (M17.11)  Note:Surgical Plans: Right Total Knee Replacement  Disposition: Home with son  PCP: Dr. Eliott Nine  IV TXA  Anesthesia Issues: None  Patient was instructed on what medications to stop prior to surgery.  Signed electronically by Lauraine Rinne, III PA-C

## 2016-10-22 NOTE — Anesthesia Preprocedure Evaluation (Signed)
Anesthesia Evaluation  Patient identified by MRN, date of birth, ID band Patient awake    Reviewed: Allergy & Precautions, NPO status , Patient's Chart, lab work & pertinent test results  Airway Mallampati: II  TM Distance: >3 FB Neck ROM: Full    Dental no notable dental hx.    Pulmonary neg pulmonary ROS,    Pulmonary exam normal breath sounds clear to auscultation       Cardiovascular hypertension, negative cardio ROS Normal cardiovascular exam Rhythm:Regular Rate:Normal     Neuro/Psych negative neurological ROS  negative psych ROS   GI/Hepatic negative GI ROS, Neg liver ROS,   Endo/Other  negative endocrine ROS  Renal/GU negative Renal ROS  negative genitourinary   Musculoskeletal negative musculoskeletal ROS (+)   Abdominal   Peds negative pediatric ROS (+)  Hematology negative hematology ROS (+)   Anesthesia Other Findings   Reproductive/Obstetrics negative OB ROS                             Anesthesia Physical Anesthesia Plan  ASA: III  Anesthesia Plan: Spinal   Post-op Pain Management:  Regional for Post-op pain   Induction:   PONV Risk Score and Plan: 2 and Ondansetron, Promethazine and Dexamethasone  Airway Management Planned: Nasal Cannula  Additional Equipment:   Intra-op Plan:   Post-operative Plan:   Informed Consent:   Dental advisory given  Plan Discussed with:   Anesthesia Plan Comments: (  )        Anesthesia Quick Evaluation

## 2016-10-22 NOTE — Anesthesia Postprocedure Evaluation (Signed)
Anesthesia Post Note  Patient: Kimberly Mccullough  Procedure(s) Performed: Procedure(s) (LRB): RIGHT TOTAL KNEE ARTHROPLASTY (Right)     Anesthesia Type: Spinal    Last Vitals:  Vitals:   10/22/16 1453 10/22/16 1454  BP:    Pulse: 73 72  Resp: 13 14  Temp:      Last Pain:  Vitals:   10/22/16 1238  TempSrc: Oral                 Janissa Bertram

## 2016-10-22 NOTE — Anesthesia Procedure Notes (Signed)
Procedure Name: MAC Date/Time: 10/22/2016 3:26 PM Performed by: Dione Booze Pre-anesthesia Checklist: Patient identified, Emergency Drugs available, Suction available and Patient being monitored Patient Re-evaluated:Patient Re-evaluated prior to inductionOxygen Delivery Method: Simple face mask Placement Confirmation: positive ETCO2

## 2016-10-23 ENCOUNTER — Encounter (HOSPITAL_COMMUNITY): Payer: Self-pay | Admitting: Orthopedic Surgery

## 2016-10-23 LAB — BASIC METABOLIC PANEL
ANION GAP: 6 (ref 5–15)
BUN: 21 mg/dL — AB (ref 6–20)
CHLORIDE: 108 mmol/L (ref 101–111)
CO2: 26 mmol/L (ref 22–32)
Calcium: 8.7 mg/dL — ABNORMAL LOW (ref 8.9–10.3)
Creatinine, Ser: 0.75 mg/dL (ref 0.44–1.00)
GFR calc Af Amer: >60 mL/min (ref >60)
Glucose, Bld: 175 mg/dL — ABNORMAL HIGH (ref 65–99)
POTASSIUM: 3.9 mmol/L (ref 3.5–5.1)
SODIUM: 140 mmol/L (ref 135–145)

## 2016-10-23 LAB — CBC
HCT: 34.4 % — ABNORMAL LOW (ref 36.0–46.0)
HEMOGLOBIN: 11.8 g/dL — AB (ref 12.0–15.0)
MCH: 29.2 pg (ref 26.0–34.0)
MCHC: 34.3 g/dL (ref 30.0–36.0)
MCV: 85.1 fL (ref 78.0–100.0)
PLATELETS: 201 10*3/uL (ref 150–400)
RBC: 4.04 MIL/uL (ref 3.87–5.11)
RDW: 13.4 % (ref 11.5–15.5)
WBC: 7.9 10*3/uL (ref 4.0–10.5)

## 2016-10-23 MED ORDER — OXYCODONE HCL 5 MG PO TABS: 5.0000 mg | ORAL_TABLET | ORAL | 0 refills | Status: DC | PRN

## 2016-10-23 MED ORDER — APIXABAN 2.5 MG PO TABS
2.5000 mg | ORAL_TABLET | Freq: Two times a day (BID) | ORAL | 0 refills | Status: DC
Start: 2016-10-24 — End: 2017-01-31

## 2016-10-23 MED ORDER — TRAMADOL HCL 50 MG PO TABS: 50.0000 mg | ORAL_TABLET | Freq: Four times a day (QID) | ORAL | 0 refills | Status: DC | PRN

## 2016-10-23 MED ORDER — SODIUM CHLORIDE 0.9 % IV BOLUS (SEPSIS)
250.0000 mL | Freq: Once | INTRAVENOUS | Status: AC
  Administered 2016-10-23: 07:00:00 250 mL via INTRAVENOUS

## 2016-10-23 MED ORDER — METHOCARBAMOL 500 MG PO TABS: 500.0000 mg | ORAL_TABLET | Freq: Four times a day (QID) | ORAL | 0 refills | Status: DC | PRN

## 2016-10-23 NOTE — Progress Notes (Signed)
Physical Therapy Treatment Patient Details Name: Kimberly Mccullough MRN: [id number] DOB: 08-Mar-1946 Today's Date: 10/23/2016    History of Present Illness [age] yo female s/p R TKA 10/22/16. Hx of L TKA 2016    PT Comments    Progressing with mobility.    Follow Up Recommendations  DC plan and follow up therapy as arranged by surgeon     Equipment Recommendations  None recommended by PT    Recommendations for Other Services       Precautions / Restrictions Precautions Precautions: Fall;Knee Required Braces or Orthoses: Knee Immobilizer - Right Knee Immobilizer - Right: Discontinue once straight leg raise with < 10 degree lag Restrictions Weight Bearing Restrictions: No RLE Weight Bearing: Weight bearing as tolerated    Mobility  Bed Mobility Overal bed mobility: Needs Assistance Bed Mobility: Sit to Supine       Sit to supine: Min assist   General bed mobility comments: Assist for R LE.   Transfers Overall transfer level: Needs assistance Equipment used: Rolling walker (2 wheeled) Transfers: Sit to/from Stand Sit to Stand: Min assist         General transfer comment: Assist to rise, stabilize, control desent. VCs safety, technique, hand/LE placement  Ambulation/Gait Ambulation/Gait assistance: Min guard Ambulation Distance (Feet): 115 Feet Assistive device: Rolling walker (2 wheeled) Gait Pattern/deviations: Step-to pattern;Antalgic;Trunk flexed     General Gait Details: Assist to stabilize. VCs safety, technique, sequence, posture, distance from RW, step length. Slow gait speed.    Stairs            Wheelchair Mobility    Modified Rankin (Stroke Patients Only)       Balance                                            Cognition Arousal/Alertness: Awake/alert Behavior During Therapy: WFL for tasks assessed/performed Overall Cognitive Status: Within Functional Limits for tasks assessed                                         Exercises      General Comments        Pertinent Vitals/Pain Pain Assessment: 0-10 Pain Score: 7  Pain Location: R knee with activity Pain Descriptors / Indicators: Aching;Sore;Tightness Pain Intervention(s): Monitored during session;Repositioned;Ice applied    Home Living Family/patient expects to be discharged to:: Private residence Living Arrangements: Children (son for a few days) Available Help at Discharge: Family Type of Home: House Home Access: Level entry   Home Layout: One level Home Equipment: Environmental consultantWalker - 2 wheels Additional Comments: pt has a Sports administratorreacher and a sock aid    Prior Function Level of Independence: Independent          PT Goals (current goals can now be found in the care plan section) Acute Rehab PT Goals Patient Stated Goal: regain plof. less pain. Progress towards PT goals: Progressing toward goals    Frequency    7X/week      PT Plan Current plan remains appropriate    Co-evaluation              AM-PAC PT "6 Clicks" Daily Activity  Outcome Measure  Difficulty turning over in bed (including adjusting bedclothes, sheets and blankets)?: A Little Difficulty moving from lying on back  to sitting on the side of the bed? : Total Difficulty sitting down on and standing up from a chair with arms (e.g., wheelchair, bedside commode, etc,.)?: Total Help needed moving to and from a bed to chair (including a wheelchair)?: A Little Help needed walking in hospital room?: A Little Help needed climbing 3-5 steps with a railing? : A Little 6 Click Score: 14    End of Session Equipment Utilized During Treatment: Gait belt;Right knee immobilizer Activity Tolerance: Patient tolerated treatment well Patient left: in bed;with call bell/phone within reach   PT Visit Diagnosis: Difficulty in walking, not elsewhere classified (R26.2);Muscle weakness (generalized) (M62.81)     Time: 1610-9604 PT Time Calculation (min) (ACUTE  ONLY): 21 min  Charges:  $Gait Training: 8-22 mins                    G Codes:          Rebeca Alert, MPT Pager: [phone]

## 2016-10-23 NOTE — Evaluation (Signed)
Physical Therapy Evaluation Patient Details Name: Kimberly Mccullough MRN: [id number] DOB: 03/17/1946 Today's Date: 10/23/2016   History of Present Illness  71 yo female s/p R TKA 10/22/16. Hx of L TKA 2016  Clinical Impression  On eval, pt required Min assist for mobility. She walked ~70 feet with a RW. Pain rated 8/10 with activity. Will follow and progress activity as tolerated.     Follow Up Recommendations DC plan and follow up therapy as arranged by surgeon    Equipment Recommendations  None recommended by PT    Recommendations for Other Services       Precautions / Restrictions Precautions Precautions: Knee;Fall Required Braces or Orthoses: Knee Immobilizer - Right Knee Immobilizer - Right: Discontinue once straight leg raise with < 10 degree lag Restrictions Weight Bearing Restrictions: No RLE Weight Bearing: Weight bearing as tolerated      Mobility  Bed Mobility Overal bed mobility: Needs Assistance Bed Mobility: Supine to Sit     Supine to sit: Min assist;HOB elevated     General bed mobility comments: Assist for R LE. Increased time. Cues for technique.  Transfers Overall transfer level: Needs assistance Equipment used: Rolling walker (2 wheeled) Transfers: Sit to/from Stand Sit to Stand: Min assist;From elevated surface         General transfer comment: Assist to rise, stabilize, control desent. VCs safety, technique, hand/LE placement  Ambulation/Gait Ambulation/Gait assistance: Min assist Ambulation Distance (Feet): 70 Feet Assistive device: Rolling walker (2 wheeled) Gait Pattern/deviations: Step-to pattern;Antalgic;Trunk flexed     General Gait Details: Assist to stabilize. VCs safety, technique, sequence, posture, distance from RW, step length. Slow gait speed. Pt c/o some lightheadedness-followed with recliner for safety  Stairs            Wheelchair Mobility    Modified Rankin (Stroke Patients Only)       Balance                                              Pertinent Vitals/Pain Pain Assessment: 0-10 Pain Score: 8  Pain Location: R posterior knee with activity Pain Descriptors / Indicators: Sore;Aching;Tightness Pain Intervention(s): Monitored during session;Repositioned;Ice applied    Home Living Family/patient expects to be discharged to:: Private residence Living Arrangements: Children (son for a few days) Available Help at Discharge: Family Type of Home: House Home Access: Level entry     Home Layout: One level Home Equipment: Environmental consultant - 2 wheels      Prior Function Level of Independence: Independent               Hand Dominance        Extremity/Trunk Assessment   Upper Extremity Assessment Upper Extremity Assessment: Defer to OT evaluation    Lower Extremity Assessment Lower Extremity Assessment: Generalized weakness (s/p R TKA)    Cervical / Trunk Assessment Cervical / Trunk Assessment: Normal  Communication   Communication: No difficulties  Cognition Arousal/Alertness: Awake/alert Behavior During Therapy: WFL for tasks assessed/performed Overall Cognitive Status: Within Functional Limits for tasks assessed                                        General Comments      Exercises Total Joint Exercises Ankle Circles/Pumps: AROM;Both;10 reps;Supine Quad Sets: AROM;Both;10 reps;Supine  Hip ABduction/ADduction: AAROM;Right;10 reps;Supine Straight Leg Raises: AAROM;Right;10 reps;Supine Knee Flexion: AAROM;Right;10 reps;Supine Goniometric ROM: ~10-60 degrees   Assessment/Plan    PT Assessment Patient needs continued PT services  PT Problem List Decreased strength;Decreased mobility;Decreased range of motion;Decreased activity tolerance;Decreased balance;Decreased knowledge of use of DME;Pain;Decreased knowledge of precautions       PT Treatment Interventions DME instruction;Therapeutic activities;Gait training;Therapeutic  exercise;Patient/family education;Balance training;Functional mobility training    PT Goals (Current goals can be found in the Care Plan section)  Acute Rehab PT Goals Patient Stated Goal: regain plof. less pain. PT Goal Formulation: With patient Time For Goal Achievement: 11/06/16 Potential to Achieve Goals: Good    Frequency 7X/week   Barriers to discharge        Co-evaluation               AM-PAC PT "6 Clicks" Daily Activity  Outcome Measure Difficulty turning over in bed (including adjusting bedclothes, sheets and blankets)?: Total Difficulty moving from lying on back to sitting on the side of the bed? : Total Difficulty sitting down on and standing up from a chair with arms (e.g., wheelchair, bedside commode, etc,.)?: Total Help needed moving to and from a bed to chair (including a wheelchair)?: A Little Help needed walking in hospital room?: A Little Help needed climbing 3-5 steps with a railing? : A Little 6 Click Score: 12    End of Session Equipment Utilized During Treatment: Gait belt;Right knee immobilizer Activity Tolerance: Patient tolerated treatment well Patient left: in chair;with call bell/phone within reach   PT Visit Diagnosis: Muscle weakness (generalized) (M62.81);Difficulty in walking, not elsewhere classified (R26.2)    Time: [id number] PT Time Calculation (min) (ACUTE ONLY): 40 min   Charges:   PT Evaluation $PT Eval Low Complexity: 1 Procedure PT Treatments $Gait Training: 8-22 mins $Therapeutic Exercise: 8-22 mins   PT G Codes:          Rebeca AlertJannie Lincy Belles, MPT Pager: [phone]

## 2016-10-23 NOTE — Discharge Summary (Signed)
Physician Discharge Summary   Patient ID: Kimberly Mccullough MRN: [id number] DOB/AGE: [birth year] 71 y.o.  Admit date: 10/22/2016 Discharge date: 10/24/2016  Primary Diagnosis: Osteoarthritis  Right knee(s) Admission Diagnoses:  Past Medical History:  Diagnosis Date  . Arthritis   . Hyperlipidemia   . Hypertension   . No blood products   . Osteopenia   . PTSD (post-traumatic stress disorder)   . Rapid heart beat    takes metoprolol to treat / denies HBP  . Tinnitus    VERY MILD; DOES NOT AFFECT HEARING    Discharge Diagnoses:   Active Problems:   OA (osteoarthritis) of knee  Estimated body mass index is 24.16 kg/m as calculated from the following:   Height as of this encounter: 5' 9.5" (1.765 m).   Weight as of this encounter: 75.3 kg (166 lb).  Procedure:  Procedure(s) (LRB): RIGHT TOTAL KNEE ARTHROPLASTY (Right)   Consults: None  HPI: Kimberly Mccullough is a 71 y.o. year old female with end stage OA of her right knee with progressively worsening pain and dysfunction. She has constant pain, with activity and at rest and significant functional deficits with difficulties even with ADLs. She has had extensive non-op management including analgesics, injections of cortisone and viscosupplements, and home exercise program, but remains in significant pain with significant dysfunction.Radiographs show bone on bone arthritis lateral and patellofemoral. She presents now for right Total Knee Arthroplasty.   Laboratory Data: Admission on 10/22/2016  Component Date Value Ref Range Status  . WBC 10/23/2016 7.9  4.0 - 10.5 K/uL Final  . RBC 10/23/2016 4.04  3.87 - 5.11 MIL/uL Final  . Hemoglobin 10/23/2016 11.8* 12.0 - 15.0 g/dL Final  . HCT 10/23/2016 34.4* 36.0 - 46.0 % Final  . MCV 10/23/2016 85.1  78.0 - 100.0 fL Final  . MCH 10/23/2016 29.2  26.0 - 34.0 pg Final  . MCHC 10/23/2016 34.3  30.0 - 36.0 g/dL Final  . RDW 10/23/2016 13.4  11.5 - 15.5 % Final  . Platelets 10/23/2016  201  150 - 400 K/uL Final  . Sodium 10/23/2016 140  135 - 145 mmol/L Final  . Potassium 10/23/2016 3.9  3.5 - 5.1 mmol/L Final  . Chloride 10/23/2016 108  101 - 111 mmol/L Final  . CO2 10/23/2016 26  22 - 32 mmol/L Final  . Glucose, Bld 10/23/2016 175* 65 - 99 mg/dL Final  . BUN 10/23/2016 21* 6 - 20 mg/dL Final  . Creatinine, Ser 10/23/2016 0.75  0.44 - 1.00 mg/dL Final  . Calcium 10/23/2016 8.7* 8.9 - 10.3 mg/dL Final  . GFR calc non Af Amer 10/23/2016 >60  >60 mL/min Final  . GFR calc Af Amer 10/23/2016 >60  >60 mL/min Final   Comment: (NOTE) The eGFR has been calculated using the CKD EPI equation. This calculation has not been validated in all clinical situations. eGFR's persistently <60 mL/min signify possible Chronic Kidney Disease.   [name] gap 10/23/2016 6  5 - 15 Final  Hospital Outpatient Visit on 10/16/2016  Component Date Value Ref Range Status  . aPTT 10/16/2016 32  24 - 36 seconds Final  . WBC 10/16/2016 4.4  4.0 - 10.5 K/uL Final  . RBC 10/16/2016 4.67  3.87 - 5.11 MIL/uL Final  . Hemoglobin 10/16/2016 13.8  12.0 - 15.0 g/dL Final  . HCT 10/16/2016 40.5  36.0 - 46.0 % Final  . MCV 10/16/2016 86.7  78.0 - 100.0 fL Final  . MCH 10/16/2016 29.6  26.0 -  34.0 pg Final  . MCHC 10/16/2016 34.1  30.0 - 36.0 g/dL Final  . RDW 10/16/2016 14.2  11.5 - 15.5 % Final  . Platelets 10/16/2016 221  150 - 400 K/uL Final  . Sodium 10/16/2016 141  135 - 145 mmol/L Final  . Potassium 10/16/2016 3.8  3.5 - 5.1 mmol/L Final  . Chloride 10/16/2016 108  101 - 111 mmol/L Final  . CO2 10/16/2016 26  22 - 32 mmol/L Final  . Glucose, Bld 10/16/2016 128* 65 - 99 mg/dL Final  . BUN 10/16/2016 17  6 - 20 mg/dL Final  . Creatinine, Ser 10/16/2016 0.79  0.44 - 1.00 mg/dL Final  . Calcium 10/16/2016 9.3  8.9 - 10.3 mg/dL Final  . Total Protein 10/16/2016 6.9  6.5 - 8.1 g/dL Final  . Albumin 10/16/2016 4.0  3.5 - 5.0 g/dL Final  . AST 10/16/2016 26  15 - 41 U/L Final  . ALT 10/16/2016 19  14 -  54 U/L Final  . Alkaline Phosphatase 10/16/2016 84  38 - 126 U/L Final  . Total Bilirubin 10/16/2016 0.5  0.3 - 1.2 mg/dL Final  . GFR calc non Af Amer 10/16/2016 >60  >60 mL/min Final  . GFR calc Af Amer 10/16/2016 >60  >60 mL/min Final   Comment: (NOTE) The eGFR has been calculated using the CKD EPI equation. This calculation has not been validated in all clinical situations. eGFR's persistently <60 mL/min signify possible Chronic Kidney Disease.   . Anion gap 10/16/2016 7  5 - 15 Final  . Prothrombin Time 10/16/2016 12.5  11.4 - 15.2 seconds Final  . INR 10/16/2016 0.93   Final  . MRSA, PCR 10/16/2016 NEGATIVE  NEGATIVE Final  . Staphylococcus aureus 10/16/2016 NEGATIVE  NEGATIVE Final   Comment:        The Xpert SA Assay (FDA approved for NASAL specimens in patients over 37 years of age), is one component of a comprehensive surveillance program.  Test performance has been validated by [facility] for patients greater than or equal to 84 year old. It is not intended to diagnose infection nor to guide or monitor treatment.      X-Rays:No results found.  EKG: Orders placed or performed in visit on 08/28/16  . EKG 12-Lead     Hospital Course: Kimberly Mccullough is a 71 y.o. who was admitted to [facility]. They were brought to the operating room on 10/22/2016 and underwent Procedure(s): RIGHT TOTAL KNEE ARTHROPLASTY.  Patient tolerated the procedure well and was later transferred to the recovery room and then to the orthopaedic floor for postoperative care.  They were given PO and IV analgesics for pain control following their surgery.  They were given 24 hours of postoperative antibiotics of  Anti-infectives    Start     Dose/Rate Route Frequency Ordered Stop   10/23/16 0600  ceFAZolin (ANCEF) IVPB 2g/100 mL premix     2 g 200 mL/hr over 30 Minutes Intravenous On call to O.R. 10/22/16 1252 10/22/16 1547   10/22/16 2200  ceFAZolin (ANCEF) IVPB 2g/100 mL premix       2 g 200 mL/hr over 30 Minutes Intravenous Every 6 hours 10/22/16 1905 10/23/16 0452   10/22/16 1300  ceFAZolin (ANCEF) 2-4 GM/100ML-% IVPB    Comments:  [place] Session   : cabinet override      10/22/16 1300 10/22/16 1532     and started on DVT prophylaxis in the form of Eliquis.   PT  and OT were ordered for total joint protocol.  Discharge planning consulted to help with postop disposition and equipment needs.  Patient had a good night on the evening of surgery.  They started to get up OOB with therapy on day one. Hemovac drain was pulled without difficulty.  Continued to work with therapy into day two.  Dressing was changed on day two and the incision was healing and looked good.   Patient was seen in rounds and was ready to go home.   Diet: Cardiac diet Activity:WBAT Follow-up:in 2 weeks Disposition - Home Discharged Condition: good   Discharge Instructions    Call MD / Call 911    Complete by:  As directed    If you experience chest pain or shortness of breath, CALL 911 and be transported to the hospital emergency room.  If you develope a fever above 101 F, pus (white drainage) or increased drainage or redness at the wound, or calf pain, call your surgeon's office.   Change dressing    Complete by:  As directed    Change dressing daily with sterile 4 x 4 inch gauze dressing and apply TED hose. Do not submerge the incision under water.   Constipation Prevention    Complete by:  As directed    Drink plenty of fluids.  Prune juice may be helpful.  You may use a stool softener, such as Colace (over the counter) 100 mg twice a day.  Use MiraLax (over the counter) for constipation as needed.   Diet - low sodium heart healthy    Complete by:  As directed    Discharge instructions    Complete by:  As directed    Take Eliquis twice a day for three weeks following discharge from the hospital, then discontinue Eliquis. Once the patient has completed the Eliquis, they may resume the 81 mg  Aspirin.  Pick up stool softner and laxative for home use following surgery while on pain medications. Do not submerge incision under water. Please use good hand washing techniques while changing dressing each day. May shower starting three days after surgery. Please use a clean towel to pat the incision dry following showers. Continue to use ice for pain and swelling after surgery. Do not use any lotions or creams on the incision until instructed by your surgeon.  Wear both TED hose on both legs during the day every day for three weeks, but may remove the TED hose at night at home.  Postoperative Constipation Protocol  Constipation - defined medically as fewer than three stools per week and severe constipation as less than one stool per week.  One of the most common issues patients have following surgery is constipation.  Even if you have a regular bowel pattern at home, your normal regimen is likely to be disrupted due to multiple reasons following surgery.  Combination of anesthesia, postoperative narcotics, change in appetite and fluid intake all can affect your bowels.  In order to avoid complications following surgery, here are some recommendations in order to help you during your recovery period.  Colace (docusate) - Pick up an over-the-counter form of Colace or another stool softener and take twice a day as long as you are requiring postoperative pain medications.  Take with a full glass of water daily.  If you experience loose stools or diarrhea, hold the colace until you stool forms back up.  If your symptoms do not get better within 1 week or if they get worse, check  with your doctor.  Dulcolax (bisacodyl) - Pick up over-the-counter and take as directed by the product packaging as needed to assist with the movement of your bowels.  Take with a full glass of water.  Use this product as needed if not relieved by Colace only.   MiraLax (polyethylene glycol) - Pick up over-the-counter to  have on hand.  MiraLax is a solution that will increase the amount of water in your bowels to assist with bowel movements.  Take as directed and can mix with a glass of water, juice, soda, coffee, or tea.  Take if you go more than two days without a movement. Do not use MiraLax more than once per day. Call your doctor if you are still constipated or irregular after using this medication for 7 days in a row.  If you continue to have problems with postoperative constipation, please contact the office for further assistance and recommendations.  If you experience "the worst abdominal pain ever" or develop nausea or vomiting, please contact the office immediatly for further recommendations for treatment.   Do not put a pillow under the knee. Place it under the heel.    Complete by:  As directed    Do not sit on low chairs, stoools or toilet seats, as it may be difficult to get up from low surfaces    Complete by:  As directed    Driving restrictions    Complete by:  As directed    No driving until released by the physician.   Increase activity slowly as tolerated    Complete by:  As directed    Lifting restrictions    Complete by:  As directed    No lifting until released by the physician.   Patient may shower    Complete by:  As directed    You may shower without a dressing once there is no drainage.  Do not wash over the wound.  If drainage remains, do not shower until drainage stops.   TED hose    Complete by:  As directed    Use stockings (TED hose) for 3 weeks on both leg(s).  You may remove them at night for sleeping.   Weight bearing as tolerated    Complete by:  As directed    Laterality:  right   Extremity:  Lower     Allergies as of 10/23/2016      Reactions   Statins Other (See Comments)   myalgia   Other    Pt is Jehovas Witness and does not want any blood products Pt is Jehovas Witness and does not want any blood products      Medication List    STOP taking these  medications   aspirin EC 81 MG tablet   Biotin 1 MG Caps   Calcium Carbonate 500 (200 Ca) MG Wafr   GLUCOSA-CHONDR-NA CHONDR-MSM PO   meloxicam 15 MG tablet Commonly known as:  MOBIC   multivitamin with minerals Tabs tablet   omega-3 acid ethyl esters 1 g capsule Commonly known as:  LOVAZA   PRESCRIPTION MEDICATION   vitamin E 400 UNIT capsule     TAKE these medications   apixaban 2.5 MG Tabs tablet Commonly known as:  ELIQUIS Take 1 tablet (2.5 mg total) by mouth every 12 (twelve) hours. Take Eliquis twice a day for three weeks following discharge from the hospital, then discontinue Eliquis. Once the patient has completed the Eliquis, they may resume the 81 mg Aspirin. Start  taking on:  10/24/2016   FIBER ADULT GUMMIES PO Take 2 tablets by mouth daily.   methocarbamol 500 MG tablet Commonly known as:  ROBAXIN Take 1 tablet (500 mg total) by mouth every 6 (six) hours as needed for muscle spasms.   oxyCODONE 5 MG immediate release tablet Commonly known as:  Oxy IR/ROXICODONE Take 1-2 tablets (5-10 mg total) by mouth every 4 (four) hours as needed for moderate pain or severe pain.   traMADol 50 MG tablet Commonly known as:  ULTRAM Take 1-2 tablets (50-100 mg total) by mouth every 6 (six) hours as needed for moderate pain.   verapamil 120 MG CR tablet Commonly known as:  CALAN-SR Take 120 mg by mouth 2 (two) times daily.      Follow-up Information    [facility] Follow up.   Specialty:  [place] Why:  [facility] physical therapy Contact information: [address] [place] [address] [phone]        Gaynelle Arabian, MD. Schedule an appointment as soon as possible for a visit on 11/06/2016.   Specialty:  Orthopedic Surgery Contact information: [address] [place] [address] [phone]           Signed: Arlee Muslim, PA-C Orthopaedic Surgery 10/23/2016, 8:17 PM

## 2016-10-23 NOTE — Evaluation (Signed)
Occupational Therapy Evaluation Patient Details Name: Kimberly Mccullough MRN: [id number] DOB: [birth year] Today's Date: 10/23/2016    History of Present Illness 71 yo female s/p R TKA 10/22/16. Hx of L TKA 2016   Clinical Impression   Pt is s/p TKA resulting in the deficits listed below (see OT Problem List).  Pt will benefit from skilled OT to increase their safety and independence with ADL and functional mobility for ADL to facilitate discharge to venue listed below.        Follow Up Recommendations  No OT follow up    Equipment Recommendations  None recommended by OT       Precautions / Restrictions Precautions Precautions: Fall;Knee Required Braces or Orthoses: Knee Immobilizer - Right Knee Immobilizer - Right: Discontinue once straight leg raise with < 10 degree lag Restrictions Weight Bearing Restrictions: No RLE Weight Bearing: Weight bearing as tolerated      Mobility Bed Mobility Overal bed mobility: Needs Assistance Bed Mobility: Sit to Supine       Sit to supine: Min assist   General bed mobility comments: pt in chair  Transfers Overall transfer level: Needs assistance Equipment used: Rolling walker (2 wheeled) Transfers: Sit to/from [employer] Sit to Stand: Min assist         General transfer comment: pt walked to the bathroom and back to chair    Balance                                           ADL either performed or assessed with clinical judgement   ADL Overall ADL's : Needs assistance/impaired Eating/Feeding: Set up;Sitting   Grooming: Set up;Sitting   Upper Body Bathing: Set up;Sitting   Lower Body Bathing: Sit to/from stand;Moderate assistance   Upper Body Dressing : Set up       Toilet Transfer: Minimal assistance;Ambulation;RW;Cueing for safety;Cueing for sequencing   Toileting- Clothing Manipulation and Hygiene: Minimal assistance;Sit to/from stand;Cueing for sequencing;Cueing for safety               Vision Patient Visual Report: No change from baseline              Pertinent Vitals/Pain Pain Assessment: 0-10 Pain Score: 7  Pain Location: R knee with activity Pain Descriptors / Indicators: Aching;Sore;Tightness Pain Intervention(s): Monitored during session;Repositioned;Ice applied     Hand Dominance     Extremity/Trunk Assessment Upper Extremity Assessment Upper Extremity Assessment: Overall WFL for tasks assessed           Communication Communication Communication: No difficulties   Cognition Arousal/Alertness: Awake/alert Behavior During Therapy: WFL for tasks assessed/performed Overall Cognitive Status: Within Functional Limits for tasks assessed                                                Home Living Family/patient expects to be discharged to:: Private residence Living Arrangements: Children (son for a few days) Available Help at Discharge: Family Type of Home: House Home Access: Level entry     Home Layout: One level     Bathroom Shower/Tub: Producer, television/film/video: Standard     Home Equipment: Environmental consultant - 2 wheels   Additional Comments: pt has a Sports administrator and a sock aid  Prior Functioning/Environment Level of Independence: Independent                 OT Problem List: Decreased safety awareness;Decreased strength;Pain      OT Treatment/Interventions: Self-care/ADL training;Patient/family education;DME and/or AE instruction    OT Goals(Current goals can be found in the care plan section) Acute Rehab OT Goals Patient Stated Goal: regain plof. less pain. OT Goal Formulation: With patient Time For Goal Achievement: 10/30/16 Potential to Achieve Goals: Good  OT Frequency: Min 2X/week   Barriers to D/C:            Co-evaluation              AM-PAC PT "6 Clicks" Daily Activity     Outcome Measure Help from another person eating meals?: None Help from another person taking  care of personal grooming?: A Little Help from another person toileting, which includes using toliet, bedpan, or urinal?: A Little Help from another person bathing (including washing, rinsing, drying)?: A Little Help from another person to put on and taking off regular upper body clothing?: A Little Help from another person to put on and taking off regular lower body clothing?: A Little 6 Click Score: 19   End of Session Equipment Utilized During Treatment: Rolling walker CPM Right Knee CPM Right Knee: Off Additional Comments: 4 hours per day Nurse Communication: Mobility status  Activity Tolerance: Patient tolerated treatment well Patient left: in chair;with call bell/phone within reach  OT Visit Diagnosis: Unsteadiness on feet (R26.81);Pain                Time: [id number] OT Time Calculation (min): 21 min Charges:  OT Evaluation $OT Eval Moderate Complexity: 1 Procedure G-Codes:     Lise AuerLori Darlina Mccaughey, OT [phone]  Einar CrowEDDING, Maricruz Lucero D 10/23/2016, 3:38 PM

## 2016-10-23 NOTE — Care Management Note (Signed)
Case Management Note  Patient Details  Name: Tilman Neatrlene C Statzer MRN: [id number] Date of Birth: [birth year]  Subjective/Objective:  71 y/o f admitted w/OA knee. From home. Has rw,3n1. PT-per surgeon recc. [facility] rep Tim following for HHPT.                  Action/Plan:d/c home w/HHPT.   Expected Discharge Date:  10/24/16               Expected Discharge Plan:  Home w Home Health Services  In-House Referral:     Discharge planning Services  CM Consult  Post Acute Care Choice:  Durable Medical Equipment (Has rw,3n1) Choice offered to:     DME Arranged:    DME Agency:     HH Arranged:  PT HH Agency:  [facility] (formerly [facility])  Status of Service:  Completed, signed off  If discussed at [employer] Meetings, dates discussed:    Additional Comments:  Lanier ClamMahabir, Makenlee Mckeag, RN 10/23/2016, 2:49 PM

## 2016-10-23 NOTE — Progress Notes (Signed)
   Subjective: 1 Day Post-Op Procedure(s) (LRB): RIGHT TOTAL KNEE ARTHROPLASTY (Right) Patient reports pain as mild.   Patient seen in rounds for Dr. Lequita HaltAluisio. She was doing okay earlier today on morning rounds. Patient is well, and has had no acute complaints or problems. No CP/SOB/N/V. Started therapy today. Walked 70 feet and then 115 feet Plan is to go Home after hospital stay.  Objective: Vital signs in last 24 hours: Temp:  [97.7 F (36.5 C)-99 F (37.2 C)] 99 F (37.2 C) (06/26 1454) Pulse Rate:  [75-85] 82 (06/26 1454) Resp:  [14-18] 18 (06/26 1454) BP: (110-121)/(67-81) 111/67 (06/26 1454) SpO2:  [98 %-100 %] 98 % (06/26 1454) Weight:  [75.3 kg (166 lb)] 75.3 kg (166 lb) (06/25 2319)  Intake/Output from previous day:  Intake/Output Summary (Last 24 hours) at 10/23/16 2008 Last data filed at 10/23/16 1718  Gross per 24 hour  Intake          2516.25 ml  Output             2760 ml  Net          -243.75 ml    Intake/Output this shift: No intake/output data recorded.  Labs:  Recent Labs  10/23/16 0416  HGB 11.8*    Recent Labs  10/23/16 0416  WBC 7.9  RBC 4.04  HCT 34.4*  PLT 201    Recent Labs  10/23/16 0416  NA 140  K 3.9  CL 108  CO2 26  BUN 21*  CREATININE 0.75  GLUCOSE 175*  CALCIUM 8.7*   No results for input(s): LABPT, INR in the last 72 hours.  EXAM General - Patient is Alert, Appropriate and Oriented Extremity - Neurovascular intact Sensation intact distally Intact pulses distally Dorsiflexion/Plantar flexion intact Dressing - dressing C/D/I Motor Function - intact, moving foot and toes well on exam.  Hemovac pulled without difficulty.  Past Medical History:  Diagnosis Date  . Arthritis   . Hyperlipidemia   . Hypertension   . No blood products   . Osteopenia   . PTSD (post-traumatic stress disorder)   . Rapid heart beat    takes metoprolol to treat / denies HBP  . Tinnitus    VERY MILD; DOES NOT AFFECT HEARING      Assessment/Plan: 1 Day Post-Op Procedure(s) (LRB): RIGHT TOTAL KNEE ARTHROPLASTY (Right) Active Problems:   OA (osteoarthritis) of knee  Estimated body mass index is 24.16 kg/m as calculated from the following:   Height as of this encounter: 5' 9.5" (1.765 m).   Weight as of this encounter: 75.3 kg (166 lb). Advance diet Up with therapy Plan for discharge tomorrow Discharge home with home health  Given extra fluids this morning.  Recheck in the AM  DVT Prophylaxis - Eliquis Weight-Bearing as tolerated to right leg D/C O2 and Pulse OX and try on Room Air  Avel Peacerew Perkins, PA-C Orthopaedic Surgery 10/23/2016, 8:08 PM

## 2016-10-23 NOTE — Discharge Instructions (Signed)
° °Dr. Frank Aluisio °Total Joint Specialist °[facility] °[address] °[place], [place] [address] °[phone] ° °TOTAL KNEE REPLACEMENT POSTOPERATIVE DIRECTIONS ° °Knee Rehabilitation, Guidelines Following Surgery  °Results after knee surgery are often greatly improved when you follow the exercise, range of motion and muscle strengthening exercises prescribed by your doctor. Safety measures are also important to protect the knee from further injury. Any time any of these exercises cause you to have increased pain or swelling in your knee joint, decrease the amount until you are comfortable again and slowly increase them. If you have problems or questions, call your caregiver or physical therapist for advice.  ° °HOME CARE INSTRUCTIONS  °Remove items at home which could result in a fall. This includes throw rugs or furniture in walking pathways.  °· ICE to the affected knee every three hours for 30 minutes at a time and then as needed for pain and swelling.  Continue to use ice on the knee for pain and swelling from surgery. You may notice swelling that will progress down to the foot and ankle.  This is normal after surgery.  Elevate the leg when you are not up walking on it.   °· Continue to use the breathing machine which will help keep your temperature down.  It is common for your temperature to cycle up and down following surgery, especially at night when you are not up moving around and exerting yourself.  The breathing machine keeps your lungs expanded and your temperature down. °· Do not place pillow under knee, focus on keeping the knee straight while resting ° °DIET °You may resume your previous home diet once your are discharged from the hospital. ° °DRESSING / WOUND CARE / SHOWERING °You may shower 3 days after surgery, but keep the wounds dry during showering.  You may use an occlusive plastic wrap (Press'n Seal for example), NO SOAKING/SUBMERGING IN THE BATHTUB.  If the  bandage gets wet, change with a clean dry gauze.  If the incision gets wet, pat the wound dry with a clean towel. °You may start showering once you are discharged home but do not submerge the incision under water. Just pat the incision dry and apply a dry gauze dressing on daily. °Change the surgical dressing daily and reapply a dry dressing each time. ° °ACTIVITY °Walk with your walker as instructed. °Use walker as long as suggested by your caregivers. °Avoid periods of inactivity such as sitting longer than an hour when not asleep. This helps prevent blood clots.  °You may resume a sexual relationship in one month or when given the OK by your doctor.  °You may return to work once you are cleared by your doctor.  °Do not drive a car for 6 weeks or until released by you surgeon.  °Do not drive while taking narcotics. ° °WEIGHT BEARING °Weight bearing as tolerated with assist device (walker, cane, etc) as directed, use it as long as suggested by your surgeon or therapist, typically at least 4-6 weeks. ° °POSTOPERATIVE CONSTIPATION PROTOCOL °Constipation - defined medically as fewer than three stools per week and severe constipation as less than one stool per week. ° °One of the most common issues patients have following surgery is constipation.  Even if you have a regular bowel pattern at home, your normal regimen is likely to be disrupted due to multiple reasons following surgery.  Combination of anesthesia, postoperative narcotics, change in appetite and fluid intake all can affect your bowels.    In order to avoid complications following surgery, here are some recommendations in order to help you during your recovery period. ° °Colace (docusate) - Pick up an over-the-counter form of Colace or another stool softener and take twice a day as long as you are requiring postoperative pain medications.  Take with a full glass of water daily.  If you experience loose stools or diarrhea, hold the colace until you stool forms  back up.  If your symptoms do not get better within 1 week or if they get worse, check with your doctor. ° °Dulcolax (bisacodyl) - Pick up over-the-counter and take as directed by the product packaging as needed to assist with the movement of your bowels.  Take with a full glass of water.  Use this product as needed if not relieved by Colace only.  ° °MiraLax (polyethylene glycol) - Pick up over-the-counter to have on hand.  MiraLax is a solution that will increase the amount of water in your bowels to assist with bowel movements.  Take as directed and can mix with a glass of water, juice, soda, coffee, or tea.  Take if you go more than two days without a movement. °Do not use MiraLax more than once per day. Call your doctor if you are still constipated or irregular after using this medication for 7 days in a row. ° °If you continue to have problems with postoperative constipation, please contact the office for further assistance and recommendations.  If you experience "the worst abdominal pain ever" or develop nausea or vomiting, please contact the office immediatly for further recommendations for treatment. ° °ITCHING ° If you experience itching with your medications, try taking only a single pain pill, or even half a pain pill at a time.  You can also use Benadryl over the counter for itching or also to help with sleep.  ° °TED HOSE STOCKINGS °Wear the elastic stockings on both legs for three weeks following surgery during the day but you may remove then at night for sleeping. ° °MEDICATIONS °See your medication summary on the “After Visit Summary” that the nursing staff will review with you prior to discharge.  You may have some home medications which will be placed on hold until you complete the course of blood thinner medication.  It is important for you to complete the blood thinner medication as prescribed by your surgeon.  Continue your approved medications as instructed at time of  discharge. ° °PRECAUTIONS °If you experience chest pain or shortness of breath - call 911 immediately for transfer to the hospital emergency department.  °If you develop a fever greater that 101 F, purulent drainage from wound, increased redness or drainage from wound, foul odor from the wound/dressing, or calf pain - CONTACT YOUR SURGEON.   °                                                °FOLLOW-UP APPOINTMENTS °Make sure you keep all of your appointments after your operation with your surgeon and caregivers. You should call the office at the above phone number and make an appointment for approximately two weeks after the date of your surgery or on the date instructed by your surgeon outlined in the "After Visit Summary". ° ° °RANGE OF MOTION AND STRENGTHENING EXERCISES  °Rehabilitation of the knee is important following a knee injury or   an operation. After just a few days of immobilization, the muscles of the thigh which control the knee become weakened and shrink (atrophy). Knee exercises are designed to build up the tone and strength of the thigh muscles and to improve knee motion. Often times heat used for twenty to thirty minutes before working out will loosen up your tissues and help with improving the range of motion but do not use heat for the first two weeks following surgery. These exercises can be done on a training (exercise) mat, on the floor, on a table or on a bed. Use what ever works the best and is most comfortable for you Knee exercises include:  Leg Lifts - While your knee is still immobilized in a splint or cast, you can do straight leg raises. Lift the leg to 60 degrees, hold for 3 sec, and slowly lower the leg. Repeat 10-20 times 2-3 times daily. Perform this exercise against resistance later as your knee gets better.  Quad and Hamstring Sets - Tighten up the muscle on the front of the thigh (Quad) and hold for 5-10 sec. Repeat this 10-20 times hourly. Hamstring sets are done by pushing the  foot backward against an object and holding for 5-10 sec. Repeat as with quad sets.   Leg Slides: Lying on your back, slowly slide your foot toward your buttocks, bending your knee up off the floor (only go as far as is comfortable). Then slowly slide your foot back down until your leg is flat on the floor again.  Angel Wings: Lying on your back spread your legs to the side as far apart as you can without causing discomfort.  A rehabilitation program following serious knee injuries can speed recovery and prevent re-injury in the future due to weakened muscles. Contact your doctor or a physical therapist for more information on knee rehabilitation.   IF YOU ARE TRANSFERRED TO A SKILLED REHAB FACILITY If the patient is transferred to a skilled rehab facility following release from the hospital, a list of the current medications will be sent to the facility for the patient to continue.  When discharged from the skilled rehab facility, please have the facility set up the patient's Home Health Physical Therapy prior to being released. Also, the skilled facility will be responsible for providing the patient with their medications at time of release from the facility to include their pain medication, the muscle relaxants, and their blood thinner medication. If the patient is still at the rehab facility at time of the two week follow up appointment, the skilled rehab facility will also need to assist the patient in arranging follow up appointment in our office and any transportation needs.  MAKE SURE YOU:  Understand these instructions.  Get help right away if you are not doing well or get worse.    Pick up stool softner and laxative for home use following surgery while on pain medications. Do not submerge incision under water. Please use good hand washing techniques while changing dressing each day. May shower starting three days after surgery. Please use a clean towel to pat the incision dry following  showers. Continue to use ice for pain and swelling after surgery. Do not use any lotions or creams on the incision until instructed by your surgeon.  Take Eliquis twice a day for three weeks following discharge from the hospital, then discontinue Eliquis. Once the patient has completed the Eliquis, they may resume the 81 mg Aspirin.   Information on my medicine -  ELIQUIS (apixaban)  Why was Eliquis prescribed for you? Eliquis was prescribed for you to reduce the risk of blood clots forming after orthopedic surgery.    What do You need to know about Eliquis? Take your Eliquis TWICE DAILY - one tablet in the morning and one tablet in the evening with or without food.  It would be best to take the dose about the same time each day.  If you have difficulty swallowing the tablet whole please discuss with your pharmacist how to take the medication safely.  Take Eliquis exactly as prescribed by your doctor and DO NOT stop taking Eliquis without talking to the doctor who prescribed the medication.  Stopping without other medication to take the place of Eliquis may increase your risk of developing a clot.  After discharge, you should have regular check-up appointments with your healthcare provider that is prescribing your Eliquis.  What do you do if you miss a dose? If a dose of ELIQUIS is not taken at the scheduled time, take it as soon as possible on the same day and twice-daily administration should be resumed.  The dose should not be doubled to make up for a missed dose.  Do not take more than one tablet of ELIQUIS at the same time.  Important Safety Information A possible side effect of Eliquis is bleeding. You should call your healthcare provider right away if you experience any of the following: ? Bleeding from an injury or your nose that does not stop. ? Unusual colored urine (red or dark brown) or unusual colored stools (red or black). ? Unusual bruising for unknown  reasons. ? A serious fall or if you hit your head (even if there is no bleeding).  Some medicines may interact with Eliquis and might increase your risk of bleeding or clotting while on Eliquis. To help avoid this, consult your healthcare provider or pharmacist prior to using any new prescription or non-prescription medications, including herbals, vitamins, non-steroidal anti-inflammatory drugs (NSAIDs) and supplements.  This website has more information on Eliquis (apixaban): http://[email or web address].com/eliquis/home

## 2016-10-24 LAB — BASIC METABOLIC PANEL
Anion gap: 5 (ref 5–15)
BUN: 23 mg/dL — ABNORMAL HIGH (ref 6–20)
CALCIUM: 8.5 mg/dL — AB (ref 8.9–10.3)
CHLORIDE: 108 mmol/L (ref 101–111)
CO2: 25 mmol/L (ref 22–32)
CREATININE: 0.73 mg/dL (ref 0.44–1.00)
GFR calc Af Amer: >60 mL/min (ref >60)
GFR calc non Af Amer: >60 mL/min (ref >60)
Glucose, Bld: 111 mg/dL — ABNORMAL HIGH (ref 65–99)
Potassium: 3.9 mmol/L (ref 3.5–5.1)
SODIUM: 138 mmol/L (ref 135–145)

## 2016-10-24 LAB — CBC
HCT: 31.3 % — ABNORMAL LOW (ref 36.0–46.0)
HEMOGLOBIN: 10.9 g/dL — AB (ref 12.0–15.0)
MCH: 29.8 pg (ref 26.0–34.0)
MCHC: 34.8 g/dL (ref 30.0–36.0)
MCV: 85.5 fL (ref 78.0–100.0)
Platelets: 196 10*3/uL (ref 150–400)
RBC: 3.66 MIL/uL — ABNORMAL LOW (ref 3.87–5.11)
RDW: 13.7 % (ref 11.5–15.5)
WBC: 8.3 10*3/uL (ref 4.0–10.5)

## 2016-10-24 NOTE — Progress Notes (Signed)
Physical Therapy Treatment Patient Details Name: Kimberly Mccullough MRN: [id number] DOB: 02-18-1946 Today's Date: 10/24/2016    History of Present Illness 71 yo female s/p R TKA 10/22/16. Hx of L TKA 2016    PT Comments    Progressing slowly with mobility. Pain rated 4/10 at rest, 8/10 with activity. Verbally reviewed car transfer. All education completed-made RN aware.   Follow Up Recommendations  DC plan and follow up therapy as arranged by surgeon; 24 hour supervision/assist     Equipment Recommendations  None recommended by PT    Recommendations for Other Services       Precautions / Restrictions Precautions Precautions: Fall;Knee Required Braces or Orthoses: Knee Immobilizer - Right Knee Immobilizer - Right: Discontinue once straight leg raise with < 10 degree lag Restrictions Weight Bearing Restrictions: No RLE Weight Bearing: Weight bearing as tolerated    Mobility  Bed Mobility Overal bed mobility: Needs Assistance Bed Mobility: Supine to Sit;Sit to Supine     Supine to sit: Min assist;HOB elevated Sit to supine: Min assist;HOB elevated   General bed mobility comments: Increased time. Cues for technique. Assist for R LE.   Transfers Overall transfer level: Needs assistance Equipment used: Rolling walker (2 wheeled) Transfers: Sit to/from Stand Sit to Stand: Min assist;From elevated surface         General transfer comment: Assist to rise, stabilize, control descent. VCs safety, technique, hand/LE placement. Initially pt had a posterior lean but she was able to correct with time and cueing  Ambulation/Gait Ambulation/Gait assistance: Min guard Ambulation Distance (Feet): 125 Feet Assistive device: Rolling walker (2 wheeled) Gait Pattern/deviations: Step-to pattern;Trunk flexed;Antalgic     General Gait Details: close guard for safety. VCs safety, sequence, posture, step length. slow gait speed. followed with recliner but did not need  it.   Stairs            Wheelchair Mobility    Modified Rankin (Stroke Patients Only)       Balance                                            Cognition Arousal/Alertness: Awake/alert Behavior During Therapy: WFL for tasks assessed/performed Overall Cognitive Status: Within Functional Limits for tasks assessed                                        Exercises      General Comments        Pertinent Vitals/Pain Pain Assessment: 0-10 Pain Score: 8  Pain Location: 8/10 with activity, 4/10 at rest R knee Pain Descriptors / Indicators: Aching;Sore;Tightness Pain Intervention(s): Monitored during session;Repositioned;Ice applied    Home Living                      Prior Function            PT Goals (current goals can now be found in the care plan section) Progress towards PT goals: Progressing toward goals    Frequency    7X/week      PT Plan Current plan remains appropriate    Co-evaluation              AM-PAC PT "6 Clicks" Daily Activity  Outcome Measure  Difficulty turning over in bed (including adjusting  bedclothes, sheets and blankets)?: Total Difficulty moving from lying on back to sitting on the side of the bed? : Total Difficulty sitting down on and standing up from a chair with arms (e.g., wheelchair, bedside commode, etc,.)?: Total Help needed moving to and from a bed to chair (including a wheelchair)?: A Little Help needed walking in hospital room?: A Little Help needed climbing 3-5 steps with a railing? : A Little 6 Click Score: 12    End of Session Equipment Utilized During Treatment: Gait belt;Right knee immobilizer Activity Tolerance: Patient tolerated treatment well (some increased pain) Patient left: in bed;with call bell/phone within reach   PT Visit Diagnosis: Muscle weakness (generalized) (M62.81);Difficulty in walking, not elsewhere classified (R26.2)     Time:  1510-1550 PT Time Calculation (min) (ACUTE ONLY): 40 min  Charges:  $Gait Training: 23-37 mins $Therapeutic Exercise: 8-22 mins $Therapeutic Activity: 8-22 mins                    G Codes:         Rebeca Alert, MPT Pager: [phone]

## 2016-10-24 NOTE — Progress Notes (Signed)
OT Cancellation Note  Patient Details Name: Kimberly Mccullough MRN: [id number] DOB: 04-05-1946   Cancelled Treatment:    Reason Eval/Treat Not Completed: Fatigue/lethargy limiting ability to participate;Pain limiting ability to participate  OT checked on pt 2 time this morning.  Will check back as schedule allows.  Lise AuerLori Ahria Slappey, [place] [phone]  Einar CrowEDDING, Caitland Porchia D 10/24/2016, 1:14 PM

## 2016-10-24 NOTE — Progress Notes (Signed)
Physical Therapy Treatment Patient Details Name: Kimberly Mccullough MRN: [id number] DOB: 09/24/1945 Today's Date: 10/24/2016    History of Present Illness 71 yo female s/p R TKA 10/22/16. Hx of L TKA 2016    PT Comments    Progressing slowly with mobility. Increased pain on today. Will have a 2nd session and determine if pt is okay to d/c home.    Follow Up Recommendations  DC plan and follow up therapy as arranged by surgeon     Equipment Recommendations  None recommended by PT    Recommendations for Other Services       Precautions / Restrictions Precautions Precautions: Fall;Knee Required Braces or Orthoses: Knee Immobilizer - Right Knee Immobilizer - Right: Discontinue once straight leg raise with < 10 degree lag Restrictions Weight Bearing Restrictions: No RLE Weight Bearing: Weight bearing as tolerated    Mobility  Bed Mobility Overal bed mobility: Needs Assistance Bed Mobility: Supine to Sit     Supine to sit: Min assist;HOB elevated     General bed mobility comments: Increased time. Cues for technique. Assist for R LE.   Transfers Overall transfer level: Needs assistance Equipment used: Rolling walker (2 wheeled) Transfers: Sit to/from Stand Sit to Stand: Min assist;From elevated surface         General transfer comment: Assist to rise, stabilize, control descent. VCs safety, technique, hand/LE placement. Initially pt had a posterior lean but she was able to self correct.  Ambulation/Gait Ambulation/Gait assistance: Min guard Ambulation Distance (Feet): 125 Feet Assistive device: Rolling walker (2 wheeled) Gait Pattern/deviations: Step-to pattern;Antalgic;Trunk flexed     General Gait Details: close guard for safety. VCs safety, sequence, posture, step length. slow gait speed. followed with recliner but did not need it.   Stairs            Wheelchair Mobility    Modified Rankin (Stroke Patients Only)       Balance                                             Cognition Arousal/Alertness: Awake/alert Behavior During Therapy: WFL for tasks assessed/performed Overall Cognitive Status: Within Functional Limits for tasks assessed                                        Exercises      General Comments        Pertinent Vitals/Pain Pain Assessment: 0-10 Pain Score: 8  Pain Location: R knee with activity Pain Descriptors / Indicators: Aching;Sore;Tightness Pain Intervention(s): Monitored during session;Repositioned;Ice applied    Home Living                      Prior Function            PT Goals (current goals can now be found in the care plan section) Progress towards PT goals: Progressing toward goals (slowly-increased pain on today)    Frequency    7X/week      PT Plan Current plan remains appropriate    Co-evaluation              AM-PAC PT "6 Clicks" Daily Activity  Outcome Measure  Difficulty turning over in bed (including adjusting bedclothes, sheets and blankets)?: Total Difficulty moving from lying on  back to sitting on the side of the bed? : Total Difficulty sitting down on and standing up from a chair with arms (e.g., wheelchair, bedside commode, etc,.)?: Total Help needed moving to and from a bed to chair (including a wheelchair)?: A Little Help needed walking in hospital room?: A Little Help needed climbing 3-5 steps with a railing? : A Little 6 Click Score: 12    End of Session Equipment Utilized During Treatment: Gait belt;Right knee immobilizer Activity Tolerance: Patient tolerated treatment well (some increased pain) Patient left: in chair;with call bell/phone within reach   PT Visit Diagnosis: Muscle weakness (generalized) (M62.81);Difficulty in walking, not elsewhere classified (R26.2)     Time: 1610-9604 PT Time Calculation (min) (ACUTE ONLY): 45 min  Charges:  $Gait Training: 38-52 mins $Therapeutic Exercise: 8-22  mins                    G Codes:         Rebeca Alert, MPT Pager: [phone]

## 2016-10-25 NOTE — Progress Notes (Signed)
   LATE ENTRY NOTE Date of Service of Visit - 10/24/2016  Subjective: 2 Days Post-Op Procedure(s) (LRB): RIGHT TOTAL KNEE ARTHROPLASTY (Right) Patient reports pain as mild.   Patient seen in rounds for Dr. Lequita HaltAluisio. Patient is well, but has had some minor complaints of pain in the knee, requiring pain medications Patient is ready to go home later today.  Objective: Vital signs in last 24 hours:    Intake/Output from previous day: No intake or output data in the 24 hours ending 10/25/16 0934  Intake/Output this shift: Total I/O In: 1380 [P.O.:1380] Out: 950 [Urine:950]  Labs:  Recent Labs  10/23/16 0416 10/24/16 0417  HGB 11.8* 10.9*    Recent Labs  10/23/16 0416 10/24/16 0417  WBC 7.9 8.3  RBC 4.04 3.66*  HCT 34.4* 31.3*  PLT 201 196    Recent Labs  10/23/16 0416 10/24/16 0417  NA 140 138  K 3.9 3.9  CL 108 108  CO2 26 25  BUN 21* 23*  CREATININE 0.75 0.73  GLUCOSE 175* 111*  CALCIUM 8.7* 8.5*   No results for input(s): LABPT, INR in the last 72 hours.  EXAM: General - Patient is Alert, Appropriate and Oriented Extremity - Neurovascular intact Sensation intact distally Intact pulses distally Dorsiflexion/Plantar flexion intact Incision - clean, dry, no drainage, healed Motor Function - intact, moving foot and toes well on exam.   Assessment/Plan: 2 Days Post-Op Procedure(s) (LRB): RIGHT TOTAL KNEE ARTHROPLASTY (Right) Procedure(s) (LRB): RIGHT TOTAL KNEE ARTHROPLASTY (Right) Past Medical History:  Diagnosis Date  . Arthritis   . Hyperlipidemia   . Hypertension   . No blood products   . Osteopenia   . PTSD (post-traumatic stress disorder)   . Rapid heart beat    takes metoprolol to treat / denies HBP  . Tinnitus    VERY MILD; DOES NOT AFFECT HEARING    Active Problems:   OA (osteoarthritis) of knee  Estimated body mass index is 24.16 kg/m as calculated from the following:   Height as of this encounter: 5' 9.5" (1.765 m).   Weight  as of this encounter: 75.3 kg (166 lb). Up with therapy Discharge home with home health Diet - Cardiac diet Follow up - in 2 weeks Activity - WBAT Disposition - Home Condition Upon Discharge - Stable D/C Meds - See DC Summary DVT Prophylaxis - Eliquis  Avel Peacerew Dalanie Kisner, PA-C Orthopaedic Surgery 10/25/2016, 9:34 AM

## 2016-11-12 ENCOUNTER — Encounter: Payer: Self-pay | Admitting: Physical Therapy

## 2016-11-12 ENCOUNTER — Ambulatory Visit: Payer: Medicare Other | Attending: Orthopedic Surgery | Admitting: Physical Therapy

## 2016-11-12 DIAGNOSIS — R2241 Localized swelling, mass and lump, right lower limb: Secondary | ICD-10-CM | POA: Diagnosis present

## 2016-11-12 DIAGNOSIS — M25561 Pain in right knee: Secondary | ICD-10-CM | POA: Insufficient documentation

## 2016-11-12 DIAGNOSIS — M25661 Stiffness of right knee, not elsewhere classified: Secondary | ICD-10-CM | POA: Diagnosis present

## 2016-11-12 DIAGNOSIS — R262 Difficulty in walking, not elsewhere classified: Secondary | ICD-10-CM | POA: Insufficient documentation

## 2016-11-12 NOTE — Therapy (Signed)
[facility] [address] [place], [place], [address] Phone: [phone]   Fax:  [phone]  Physical Therapy Evaluation  Patient Details  Name: Kimberly Mccullough MRN: [id number] Date of Birth: [birth year] Referring Provider: Ollen Gross  Encounter Date: 11/12/2016      PT End of Session - 11/12/16 1508    Visit Number 1   Date for PT Re-Evaluation 01/07/17   PT Start Time 1355   PT Stop Time 1450   PT Time Calculation (min) 55 min   Activity Tolerance Patient tolerated treatment well   Behavior During Therapy [facility] for tasks assessed/performed      Past Medical History:  Diagnosis Date  . Arthritis   . Hyperlipidemia   . Hypertension   . No blood products   . Osteopenia   . PTSD (post-traumatic stress disorder)   . Rapid heart beat    takes metoprolol to treat / denies HBP  . Tinnitus    VERY MILD; DOES NOT AFFECT HEARING     Past Surgical History:  Procedure Laterality Date  . ABDOMINAL HYSTERECTOMY    . BUNIONECTOMY     BILATERAL  . CHOLECYSTECTOMY    . JOINT REPLACEMENT    . TOTAL HIP ARTHROPLASTY Left 11/11/2013   Procedure: LEFT TOTAL HIP ARTHROPLASTY ANTERIOR APPROACH;  Surgeon: Loanne Drilling, MD;  Location: [facility];  Service: Orthopedics;  Laterality: Left;  . TOTAL KNEE ARTHROPLASTY Left 08/16/2014   Procedure: LEFT TOTAL KNEE ARTHROPLASTY;  Surgeon: Ollen Gross, MD;  Location: [facility];  Service: Orthopedics;  Laterality: Left;  . TOTAL KNEE ARTHROPLASTY Right 10/22/2016   Procedure: RIGHT TOTAL KNEE ARTHROPLASTY;  Surgeon: Ollen Gross, MD;  Location: [facility];  Service: Orthopedics;  Laterality: Right;    There were no vitals filed for this visit.       Subjective Assessment - 11/12/16 1401    Subjective Pt is s/p R TKR on 10/22/16. Said that she thinks the recovery on this one is talking a little longer than with her left TKR. Reports no issues with ADL's at home.    Limitations  Sitting   How long can you sit comfortably? Reports pain with sitting in chairs that touch the back of her knee.    Patient Stated Goals Reduce pain and improve ROM, return to prior level of function.    Currently in Pain? No/denies   Pain Score 8    Pain Location Knee   Pain Orientation Right   Pain Descriptors / Indicators Aching;Tightness   Pain Type Surgical pain   Pain Radiating Towards None   Pain Onset 1 to 4 weeks ago   Pain Frequency Several days a week   Aggravating Factors  Morning stiffness, overactivity, sitting in a chair that touches the back of her leg.    Pain Relieving Factors Moving, pain medication            [facility] PT Assessment - 11/12/16 0001      Assessment   Medical Diagnosis s/p R TKR   Referring Provider Homero Fellers Aluisio   Onset Date/Surgical Date 10/22/16   Hand Dominance Right   Next MD Visit End of July   Prior Therapy HHPT after discharge     Precautions   Precaution Comments Had L knee and L hip replaced     Restrictions   Weight Bearing Restrictions No     Balance Screen   Has the patient fallen in the past 6  months No   Has the patient had a decrease in activity level because of a fear of falling?  No   Is the patient reluctant to leave their home because of a fear of falling?  No     Home Environment   Living Environment Private residence   Living Arrangements Children  lives with her son   Additional Comments No stairs in the house or to enter.      Prior Function   Level of Independence Independent   Vocation Retired   Leisure Does own cooking, Education officer, environmentalcleaning, Presenter, broadcastingyardwork. Went to gym 3 x per week prior to surgery. Pt is a Jehovah's Witness and visits houses.      Observation/Other Assessments-Edema    Edema Circumferential     Circumferential Edema   Circumferential - Right 44 cm   Circumferential - Left  38 cm     Sensation   Additional Comments LE dermatomes are intact and symmetrical.      Posture/Postural Control   Posture  Comments Forward head, rounded shoulders     ROM / Strength   AROM / PROM / Strength AROM;PROM;Strength     AROM   AROM Assessment Site Knee   Right/Left Knee Right   Right Knee Extension 5   Right Knee Flexion 82     PROM   PROM Assessment Site Knee   Right/Left Knee Right   Right Knee Extension 3   Right Knee Flexion 85     Strength   Overall Strength Comments R hip flexion 4-/5   Strength Assessment Site Knee   Right/Left Knee Right   Right Knee Flexion 4-/5   Right Knee Extension 4/5     Palpation   Palpation comment Pt has swelling around her knee, and is tender to palpation on the posterior side of her knee joint.     Bed Mobility   Bed Mobility --  Pt reports no issues with bed mobility.      Transfers   Comments Pt needs hands to complete sit to stand transfer.      Ambulation/Gait   Gait Comments Pt has forward flexed posture with walking and antalgic gait on R side.      Standardized Balance Assessment   Standardized Balance Assessment Timed Up and Go Test     Timed Up and Go Test   Normal TUG (seconds) 18   TUG Comments Used hands to push off of chair            Objective measurements completed on examination: See above findings.          [facility] Adult PT Treatment/Exercise - 11/12/16 0001      Exercises   Exercises Knee/Hip     Knee/Hip Exercises: Aerobic   Nustep Level 4, 5 minutes                PT Education - 11/12/16 1506    Education provided Yes   Education Details Low load, long duration knee flexion and extension stretches.    Person(s) Educated Patient   Methods Explanation;Demonstration;Verbal cues;Handout   Comprehension Verbalized understanding          PT Short Term Goals - 11/12/16 1519      PT SHORT TERM GOAL #1   Title Independent with initial HEP.    Time 1   Period Weeks   Status New           PT Long Term Goals - 11/12/16 1520  PT LONG TERM GOAL #1   Title Independent with advanced  HEP, gym routine.    Time 8   Period Weeks   Status New     PT LONG TERM GOAL #2   Title Improve right knee flexion to 120 degrees   Time 8   Period Weeks   Status New     PT LONG TERM GOAL #3   Title Ambulate in the community 1000 feet without pain and difficulty.    Time 8   Period Weeks   Status New     PT LONG TERM GOAL #4   Title Increase strength to 4+/5 for R knee flexion and extension.    Time 8   Period Weeks   Status New     PT LONG TERM GOAL #5   Title Able to ambulate up and down stairs step over step.    Time 8   Period Weeks   Status New                Plan - 11/14/2016 1509    Clinical Impression Statement Pt is s/p R TKR on 10/22/16. She has limitations in ROM and strength, and significant swelling from the procedure. She reports muscle spasms in her quads. Her TUG time puts her in the lower end of the falls risk range. She was very active prior to her proceedure and seems motivated to progress with PT.    Clinical Presentation Evolving   Clinical Decision Making Low   Rehab Potential Good   PT Frequency 2x / week   PT Duration 8 weeks   PT Treatment/Interventions ADLs/Self Care Home Management;Cryotherapy;Quarry manager;Therapeutic activities;Therapeutic exercise;Manual techniques;Patient/family education;Passive range of motion   PT Next Visit Plan Work on ROM and strengthening. Vasopneumatic and other modalities for pain.    PT Home Exercise Plan Low load, long duration stretches for ROM. Advised that Pt could continue activites from HHPT too.    Consulted and Agree with Plan of Care Patient      Patient will benefit from skilled therapeutic intervention in order to improve the following deficits and impairments:  Abnormal gait, Decreased activity tolerance, Decreased mobility, Decreased endurance, Decreased range of motion, Decreased strength, Increased edema, Difficulty walking, Increased muscle spasms,  Impaired sensation, Postural dysfunction, Pain  Visit Diagnosis: Acute pain of right knee  Stiffness of right knee, not elsewhere classified  Difficulty in walking, not elsewhere classified  Localized swelling, mass and lump, right lower limb      G-Codes - 2016-11-14 1534    Functional Assessment Tool Used (Outpatient Only) foto 61% limitation   Functional Limitation Mobility: Walking and moving around   Mobility: Walking and Moving Around Current Status [phone]) At least 60 percent but less than 80 percent impaired, limited or restricted   Mobility: Walking and Moving Around Goal Status [phone]) At least 40 percent but less than 60 percent impaired, limited or restricted       Problem List Patient Active Problem List   Diagnosis Date Noted  . OA (osteoarthritis) of knee 08/16/2014  . Postoperative anemia due to acute blood loss 11-14-2013  . OA (osteoarthritis) of hip 11/11/2013  . Palpitations 03/11/2012    Carin Primrose, SPT 11/14/2016, 4:26 PM  [facility] [place] [address] [place], [place], [address] Phone: [phone]   Fax:  [phone]  Name: Kimberly Mccullough MRN: [id number] Date of Birth: 1946-03-29

## 2016-11-14 ENCOUNTER — Encounter: Payer: Self-pay | Admitting: Physical Therapy

## 2016-11-14 ENCOUNTER — Ambulatory Visit: Payer: Medicare Other | Admitting: Physical Therapy

## 2016-11-14 DIAGNOSIS — R2241 Localized swelling, mass and lump, right lower limb: Secondary | ICD-10-CM

## 2016-11-14 DIAGNOSIS — M25661 Stiffness of right knee, not elsewhere classified: Secondary | ICD-10-CM

## 2016-11-14 DIAGNOSIS — R262 Difficulty in walking, not elsewhere classified: Secondary | ICD-10-CM

## 2016-11-14 DIAGNOSIS — M25561 Pain in right knee: Secondary | ICD-10-CM

## 2016-11-14 NOTE — Therapy (Signed)
[facility] [address] [place], [place], [address] Phone: [phone]   Fax:  [phone]  Physical Therapy Treatment  Patient Details  Name: Kimberly Mccullough MRN: [id number] Date of Birth: [birth year] Referring Provider: Ollen GrossFrank Aluisio  Encounter Date: 11/14/2016      PT End of Session - 11/14/16 1139    Visit Number 2   Date for PT Re-Evaluation 01/07/17   PT Start Time 1100   PT Stop Time 1155   PT Time Calculation (min) 55 min      Past Medical History:  Diagnosis Date  . Arthritis   . Hyperlipidemia   . Hypertension   . No blood products   . Osteopenia   . PTSD (post-traumatic stress disorder)   . Rapid heart beat    takes metoprolol to treat / denies HBP  . Tinnitus    VERY MILD; DOES NOT AFFECT HEARING     Past Surgical History:  Procedure Laterality Date  . ABDOMINAL HYSTERECTOMY    . BUNIONECTOMY     BILATERAL  . CHOLECYSTECTOMY    . JOINT REPLACEMENT    . TOTAL HIP ARTHROPLASTY Left 11/11/2013   Procedure: LEFT TOTAL HIP ARTHROPLASTY ANTERIOR APPROACH;  Surgeon: Loanne DrillingFrank Aluisio V, MD;  [facility];  Service: Orthopedics;  Laterality: Left;  . TOTAL KNEE ARTHROPLASTY Left 08/16/2014   Procedure: LEFT TOTAL KNEE ARTHROPLASTY;  Surgeon: Ollen GrossFrank Aluisio, MD;  [facility];  Service: Orthopedics;  Laterality: Left;  . TOTAL KNEE ARTHROPLASTY Right 10/22/2016   Procedure: RIGHT TOTAL KNEE ARTHROPLASTY;  Surgeon: Ollen GrossAluisio, Frank, MD;  [facility];  Service: Orthopedics;  Laterality: Right;    There were no vitals filed for this visit.      Subjective Assessment - 11/14/16 1101    Subjective Pt reports some stiffness in her knee.   Currently in Pain? No/denies   Pain Score 0-No pain  Took med's before therapy                         [facility] Adult PT Treatment/Exercise - 11/14/16 0001      Knee/Hip Exercises: Machines for Strengthening   Total Gym Leg Press 20lb  2x10     Knee/Hip Exercises: Standing   Lateral Step Up 2 sets;Right;10 reps;Step Height: 4"     Knee/Hip Exercises: Seated   Long Arc Quad Right;2 sets;10 reps   Hamstring Curl 2 sets;10 reps;Right   Hamstring Limitations red tband    Sit to Sand 2 sets;5 reps;with UE support  from blue chair     Knee/Hip Exercises: Supine   Quad Sets 2 sets;Right;10 reps     Modalities   Modalities Vasopneumatic     Vasopneumatic   Number Minutes Vasopneumatic  15 minutes   Vasopnuematic Location  Knee   Vasopneumatic Pressure Medium   Vasopneumatic Temperature  40     Manual Therapy   Manual Therapy Passive ROM   Manual therapy comments some PROM take to end rang and held    Passive ROM R knee flex & Ext                   PT Short Term Goals - 11/14/16 1141      PT SHORT TERM GOAL #1   Title Independent with initial HEP.    Status Achieved           PT Long Term Goals - 11/12/16 1520  PT LONG TERM GOAL #1   Title Independent with advanced HEP, gym routine.    Time 8   Period Weeks   Status New     PT LONG TERM GOAL #2   Title Improve right knee flexion to 120 degrees   Time 8   Period Weeks   Status New     PT LONG TERM GOAL #3   Title Ambulate in the community 1000 feet without pain and difficulty.    Time 8   Period Weeks   Status New     PT LONG TERM GOAL #4   Title Increase strength to 4+/5 for R knee flexion and extension.    Time 8   Period Weeks   Status New     PT LONG TERM GOAL #5   Title Able to ambulate up and down stairs step over step.    Time 8   Period Weeks   Status New               Plan - 11/14/16 1140    Clinical Impression Statement Pt tolerated an initial progression to exercises well. Dose appear to be nervous but seemed to relax as treatment progressed. No reports of increase pain with activity, able to amb around clinic without AD.   Rehab Potential Good   PT Frequency 2x / week   PT Duration 8 weeks   PT  Treatment/Interventions ADLs/Self Care Home Management;Cryotherapy;Quarry manager;Therapeutic activities;Therapeutic exercise;Manual techniques;Patient/family education;Passive range of motion   PT Next Visit Plan Work on ROM and strengthening. Vasopneumatic and other modalities for pain.       Patient will benefit from skilled therapeutic intervention in order to improve the following deficits and impairments:  Abnormal gait, Decreased activity tolerance, Decreased mobility, Decreased endurance, Decreased range of motion, Decreased strength, Increased edema, Difficulty walking, Increased muscle spasms, Impaired sensation, Postural dysfunction, Pain  Visit Diagnosis: Acute pain of right knee  Stiffness of right knee, not elsewhere classified  Difficulty in walking, not elsewhere classified  Localized swelling, mass and lump, right lower limb     Problem List Patient Active Problem List   Diagnosis Date Noted  . OA (osteoarthritis) of knee 08/16/2014  . Postoperative anemia due to acute blood loss 11/12/2013  . OA (osteoarthritis) of hip 11/11/2013  . Palpitations 03/11/2012    Kimberly Mccullough 11/14/2016, 11:42 AM  [facility] [place] [address] [place], [place], [address] Phone: [phone]   Fax:  [phone]  Name: Kimberly Mccullough MRN: [id number] Date of Birth: Sep 06, 1945

## 2016-11-19 ENCOUNTER — Encounter: Payer: Self-pay | Admitting: Physical Therapy

## 2016-11-19 ENCOUNTER — Ambulatory Visit: Payer: Medicare Other | Admitting: Physical Therapy

## 2016-11-19 DIAGNOSIS — M25561 Pain in right knee: Secondary | ICD-10-CM | POA: Diagnosis not present

## 2016-11-19 DIAGNOSIS — M25661 Stiffness of right knee, not elsewhere classified: Secondary | ICD-10-CM

## 2016-11-19 DIAGNOSIS — R2241 Localized swelling, mass and lump, right lower limb: Secondary | ICD-10-CM

## 2016-11-19 DIAGNOSIS — R262 Difficulty in walking, not elsewhere classified: Secondary | ICD-10-CM

## 2016-11-19 NOTE — Therapy (Signed)
[facility] [address] [place], [place], [address] Phone: [phone]   Fax:  [phone]  Physical Therapy Treatment  Patient Details  Name: Kimberly Mccullough MRN: [id number] Date of Birth: Oct 16, 1945 Referring Provider: Ollen GrossFrank Aluisio  Encounter Date: 11/19/2016      PT End of Session - 11/19/16 1334    Visit Number 3   Date for PT Re-Evaluation 01/07/17   PT Start Time 1250   PT Stop Time 1345   PT Time Calculation (min) 55 min   Activity Tolerance Patient tolerated treatment well   Behavior [facility] for tasks assessed/performed      Past Medical History:  Diagnosis Date  . Arthritis   . Hyperlipidemia   . Hypertension   . No blood products   . Osteopenia   . PTSD (post-traumatic stress disorder)   . Rapid heart beat    takes metoprolol to treat / denies HBP  . Tinnitus    VERY MILD; DOES NOT AFFECT HEARING     Past Surgical History:  Procedure Laterality Date  . ABDOMINAL HYSTERECTOMY    . BUNIONECTOMY     BILATERAL  . CHOLECYSTECTOMY    . JOINT REPLACEMENT    . TOTAL HIP ARTHROPLASTY Left 11/11/2013   Procedure: LEFT TOTAL HIP ARTHROPLASTY ANTERIOR APPROACH;  Surgeon: Loanne DrillingFrank Aluisio V, MD;  [facility];  Service: Orthopedics;  Laterality: Left;  . TOTAL KNEE ARTHROPLASTY Left 08/16/2014   Procedure: LEFT TOTAL KNEE ARTHROPLASTY;  Surgeon: Ollen GrossFrank Aluisio, MD;  [facility];  Service: Orthopedics;  Laterality: Left;  . TOTAL KNEE ARTHROPLASTY Right 10/22/2016   Procedure: RIGHT TOTAL KNEE ARTHROPLASTY;  Surgeon: Ollen GrossAluisio, Frank, MD;  [facility];  Service: Orthopedics;  Laterality: Right;    There were no vitals filed for this visit.      Subjective Assessment - 11/19/16 1256    Subjective Pt reports that she has not been sleeping well. Said that she can't get her knee comfortable at night. Also said that she thinks it is mostly her anxiety that is keeping her awake. She  report stretch in the scar that is painful when doing her ROM exercises, but the joint is not painful.    Patient Stated Goals Reduce pain and improve ROM, return to prior level of function.    Currently in Pain? No/denies   Pain Score 0-No pain                         [facility] Adult PT Treatment/Exercise - 11/19/16 0001      Knee/Hip Exercises: Aerobic   Nustep Level 5, 6 minutes     Knee/Hip Exercises: Machines for Strengthening   Total Gym Leg Press 20lb 1x10 (postion 7), 2x10 (postion 6)      Knee/Hip Exercises: Standing   Lateral Step Up 2 sets;Right;10 reps;Step Height: 6"     Knee/Hip Exercises: Seated   Long Arc Quad Right;2 sets;10 reps   Hamstring Curl 2 sets;Right;15 reps   Hamstring Limitations red theraband   Sit to Sand 2 sets;with UE support;10 reps     Modalities   Modalities Vasopneumatic     Vasopneumatic   Number Minutes Vasopneumatic  15 minutes   Vasopnuematic Location  Knee   Vasopneumatic Pressure Medium   Vasopneumatic Temperature  40  with pillow case around knee     Manual Therapy   Manual Therapy Passive ROM   Manual therapy  comments some PROM take to end rang and held    Passive ROM R knee flex & Ext                 PT Education - 11/19/16 1333    Education provided Yes   Education Details Heel slides, terminal knee extension, quad set with towel roll under knee, standing knee flexion.    Person(s) Educated Patient   Methods Explanation;Demonstration;Verbal cues;Handout   Comprehension Verbalized understanding          PT Short Term Goals - 11/14/16 1141      PT SHORT TERM GOAL #1   Title Independent with initial HEP.    Status Achieved           PT Long Term Goals - 11/12/16 1520      PT LONG TERM GOAL #1   Title Independent with advanced HEP, gym routine.    Time 8   Period Weeks   Status New     PT LONG TERM GOAL #2   Title Improve right knee flexion to 120 degrees   Time 8   Period Weeks    Status New     PT LONG TERM GOAL #3   Title Ambulate in the community 1000 feet without pain and difficulty.    Time 8   Period Weeks   Status New     PT LONG TERM GOAL #4   Title Increase strength to 4+/5 for R knee flexion and extension.    Time 8   Period Weeks   Status New     PT LONG TERM GOAL #5   Title Able to ambulate up and down stairs step over step.    Time 8   Period Weeks   Status New               Plan - 11/19/16 1334    Clinical Impression Statement Pt did well with progressions in her exercises today, but she has a lot of anxiety about the stiffness in her knee and thinks that her L knee felt better at this point. I advised that she is doing well and we're not seeing anything concerning regarding her progression at this point, and I think as she continues with PT she will see the improvements that she is looking for.  She is walking without using her cane, but carries it just in case. Her gait appears smooth and without antalgia.    PT Treatment/Interventions ADLs/Self Care Home Management;Cryotherapy;Quarry manager;Therapeutic activities;Therapeutic exercise;Manual techniques;Patient/family education;Passive range of motion   PT Next Visit Plan Work on ROM and strengthening. Vasopneumatic and other modalities for pain.    PT Home Exercise Plan Low load, long duration stretches for ROM. Advised that Pt could continue activites from HHPT too. Added heel slides, terminal knee extension, quad sets, and standing knee flexion.    Consulted and Agree with Plan of Care Patient      Patient will benefit from skilled therapeutic intervention in order to improve the following deficits and impairments:  Abnormal gait, Decreased activity tolerance, Decreased mobility, Decreased endurance, Decreased range of motion, Decreased strength, Increased edema, Difficulty walking, Increased muscle spasms, Impaired sensation, Postural dysfunction,  Pain  Visit Diagnosis: Acute pain of right knee  Stiffness of right knee, not elsewhere classified  Difficulty in walking, not elsewhere classified  Localized swelling, mass and lump, right lower limb     Problem List Patient Active Problem List   Diagnosis Date Noted  .  OA (osteoarthritis) of knee 08/16/2014  . Postoperative anemia due to acute blood loss 11/12/2013  . OA (osteoarthritis) of hip 11/11/2013  . Palpitations 03/11/2012    Carin Primrose, SPT 11/19/2016, 1:48 PM  [facility] [address] [place], [place], [address] Phone: [phone]   Fax:  [phone]  Name: MATEYA TORTI MRN: [id number] Date of Birth: [birth year]

## 2016-11-21 ENCOUNTER — Encounter: Payer: Self-pay | Admitting: Physical Therapy

## 2016-11-21 ENCOUNTER — Ambulatory Visit: Payer: Medicare Other | Admitting: Physical Therapy

## 2016-11-21 DIAGNOSIS — M25561 Pain in right knee: Secondary | ICD-10-CM | POA: Diagnosis not present

## 2016-11-21 DIAGNOSIS — M25661 Stiffness of right knee, not elsewhere classified: Secondary | ICD-10-CM

## 2016-11-21 DIAGNOSIS — R262 Difficulty in walking, not elsewhere classified: Secondary | ICD-10-CM

## 2016-11-21 DIAGNOSIS — R2241 Localized swelling, mass and lump, right lower limb: Secondary | ICD-10-CM

## 2016-11-21 NOTE — Therapy (Signed)
[place], [place], [address] Phone: [phone]   Fax:  [phone]  Physical Therapy Treatment  Patient Details  Name: Kimberly Mccullough MRN: [id number] Date of Birth: [birth year] Referring Provider: Gaynelle Arabian  Encounter Date: 11/21/2016      PT End of Session - 11/21/16 1332    Visit Number 4   Date for PT Re-Evaluation 01/07/17   PT Start Time 1250   PT Stop Time 1350   PT Time Calculation (min) 60 min   Activity Tolerance Patient tolerated treatment well   Behavior During Therapy [facility] for tasks assessed/performed      Past Medical History:  Diagnosis Date  . Arthritis   . Hyperlipidemia   . Hypertension   . No blood products   . Osteopenia   . PTSD (post-traumatic stress disorder)   . Rapid heart beat    takes metoprolol to treat / denies HBP  . Tinnitus    VERY MILD; DOES NOT AFFECT HEARING     Past Surgical History:  Procedure Laterality Date  . ABDOMINAL HYSTERECTOMY    . BUNIONECTOMY     BILATERAL  . CHOLECYSTECTOMY    . JOINT REPLACEMENT    . TOTAL HIP ARTHROPLASTY Left 11/11/2013   Procedure: LEFT TOTAL HIP ARTHROPLASTY ANTERIOR APPROACH;  Surgeon: Gearlean Alf, MD;  [facility];  Service: Orthopedics;  Laterality: Left;  . TOTAL KNEE ARTHROPLASTY Left 08/16/2014   Procedure: LEFT TOTAL KNEE ARTHROPLASTY;  Surgeon: Gaynelle Arabian, MD;  [facility];  Service: Orthopedics;  Laterality: Left;  . TOTAL KNEE ARTHROPLASTY Right 10/22/2016   Procedure: RIGHT TOTAL KNEE ARTHROPLASTY;  Surgeon: Gaynelle Arabian, MD;  [facility];  Service: Orthopedics;  Laterality: Right;    There were no vitals filed for this visit.      Subjective Assessment - 11/21/16 1256    Subjective Pt reports that she is doing well today. She has been taking an OTC that is helping her sleep better. Reports some tightness in the lateral side of her knee, she is massaging the skin to  try and get some more motion.    Patient Stated Goals Reduce pain and improve ROM, return to prior level of function.    Currently in Pain? No/denies   Pain Score 0-No pain                         [facility] Adult PT Treatment/Exercise - 11/21/16 0001      Ambulation/Gait   Stairs Yes   Stairs Assistance 6: Modified independent (Device/Increase time)   Stair Management Technique One rail Right;Alternating pattern  Pt was able to go up last 12 steps w/o hand rail   Number of Stairs 24   Height of Stairs 6     Knee/Hip Exercises: Aerobic   Nustep Level 4, 3 minutes, level 5, 3 min     Knee/Hip Exercises: Machines for Strengthening   Total Gym Leg Press 20lb 1x10 (postion 7), 2x10 (postion 6)      Knee/Hip Exercises: Seated   Long Arc Quad Right;2 sets;15 reps;Weights   Long Arc Quad Weight 1 lbs.   Hamstring Curl Right;2 sets;15 reps   Hamstring Limitations green theraband   Sit to Sand 2 sets;10 reps;with UE support  blue chair, 1 set w. airex, no UE support     Modalities   Modalities Vasopneumatic     Vasopneumatic  Number Minutes Vasopneumatic  15 minutes   Vasopnuematic Location  Knee   Vasopneumatic Pressure Low   Vasopneumatic Temperature  40     Manual Therapy   Manual Therapy Passive ROM   Manual therapy comments some PROM take to end rang and held    Passive ROM R knee flex & Ext                   PT Short Term Goals - 11/14/16 1141      PT SHORT TERM GOAL #1   Title Independent with initial HEP.    Status Achieved           PT Long Term Goals - 11/21/16 1334      PT LONG TERM GOAL #5   Title Able to ambulate up and down stairs step over step.    Time 8   Period Weeks   Status Partially Met               Plan - 11/21/16 1333    Clinical Impression Statement Pt did well with therapy today. She seems less anxious and was able to progress with her exercises. She did well with the stairs and was able to go step over  step with limited compensations. Does have some muscle tightness and tenderness in VL, VR, and tibialis anterior. Showed Pt how to do a little STM on herself at home and asked her to let us know if this was still bothering her at her next appt.       Patient will benefit from skilled therapeutic intervention in order to improve the following deficits and impairments:     Visit Diagnosis: Acute pain of right knee  Stiffness of right knee, not elsewhere classified  Difficulty in walking, not elsewhere classified  Localized swelling, mass and lump, right lower limb     Problem List Patient Active Problem List   Diagnosis Date Noted  . OA (osteoarthritis) of knee 08/16/2014  . Postoperative anemia due to acute blood loss 11/12/2013  . OA (osteoarthritis) of hip 11/11/2013  . Palpitations 03/11/2012    Lennart Pall, SPT 11/21/2016, 1:57 PM  [place], [place], [address] Phone: [phone]   Fax:  [phone]  Name: MELLANIE BEJARANO MRN: [id number] Date of Birth: December 01, 1945

## 2016-11-22 ENCOUNTER — Ambulatory Visit: Payer: Medicare Other | Admitting: Cardiovascular Disease

## 2016-11-26 ENCOUNTER — Encounter: Payer: Self-pay | Admitting: Physical Therapy

## 2016-11-26 ENCOUNTER — Ambulatory Visit: Payer: Medicare Other | Admitting: Physical Therapy

## 2016-11-26 DIAGNOSIS — R262 Difficulty in walking, not elsewhere classified: Secondary | ICD-10-CM

## 2016-11-26 DIAGNOSIS — M25561 Pain in right knee: Secondary | ICD-10-CM | POA: Diagnosis not present

## 2016-11-26 DIAGNOSIS — M25661 Stiffness of right knee, not elsewhere classified: Secondary | ICD-10-CM

## 2016-11-26 DIAGNOSIS — R2241 Localized swelling, mass and lump, right lower limb: Secondary | ICD-10-CM

## 2016-11-26 NOTE — Therapy (Signed)
[place], [place], [address] Phone: [phone]   Fax:  [phone]  Physical Therapy Treatment  Patient Details  Name: ANDERSEN MCKIVER MRN: [id number] Date of Birth: Aug 11, 1945 Referring Provider: Gaynelle Arabian  Encounter Date: 11/26/2016      PT End of Session - 11/26/16 1339    Visit Number 5   Date for PT Re-Evaluation 01/07/17   PT Start Time 1300   PT Stop Time 1341   PT Time Calculation (min) 41 min      Past Medical History:  Diagnosis Date  . Arthritis   . Hyperlipidemia   . Hypertension   . No blood products   . Osteopenia   . PTSD (post-traumatic stress disorder)   . Rapid heart beat    takes metoprolol to treat / denies HBP  . Tinnitus    VERY MILD; DOES NOT AFFECT HEARING     Past Surgical History:  Procedure Laterality Date  . ABDOMINAL HYSTERECTOMY    . BUNIONECTOMY     BILATERAL  . CHOLECYSTECTOMY    . JOINT REPLACEMENT    . TOTAL HIP ARTHROPLASTY Left 11/11/2013   Procedure: LEFT TOTAL HIP ARTHROPLASTY ANTERIOR APPROACH;  Surgeon: Gearlean Alf, MD;  [facility];  Service: Orthopedics;  Laterality: Left;  . TOTAL KNEE ARTHROPLASTY Left 08/16/2014   Procedure: LEFT TOTAL KNEE ARTHROPLASTY;  Surgeon: Gaynelle Arabian, MD;  [facility];  Service: Orthopedics;  Laterality: Left;  . TOTAL KNEE ARTHROPLASTY Right 10/22/2016   Procedure: RIGHT TOTAL KNEE ARTHROPLASTY;  Surgeon: Gaynelle Arabian, MD;  [facility];  Service: Orthopedics;  Laterality: Right;    There were no vitals filed for this visit.      Subjective Assessment - 11/26/16 1258    Subjective Pt reports that she is fine, reports that she has some numbness under her knee cap   Currently in Pain? Yes   Pain Score 5    Pain Location Knee   Pain Orientation Right            [facility] PT Assessment - 11/26/16 0001      AROM   AROM Assessment Site Knee   Right/Left Knee Right   Right  Knee Extension 4   Right Knee Flexion 109                     [facility] Adult PT Treatment/Exercise - 11/26/16 0001      Knee/Hip Exercises: Aerobic   Nustep L5 x 6 min      Knee/Hip Exercises: Machines for Strengthening   Cybex Knee Extension 5lb 3x10   Cybex Knee Flexion 25lb 2x15    Total Gym Leg Press 30lb 2x10 (postion 6)      Knee/Hip Exercises: Seated   Long Arc Quad Right;2 sets;15 reps;Weights   Long Arc Quad Weight 2 lbs.   Sit to Sand 2 sets;10 reps;with UE support  blue chair                  PT Short Term Goals - 11/14/16 1141      PT SHORT TERM GOAL #1   Title Independent with initial HEP.    Status Achieved           PT Long Term Goals - 11/21/16 1334      PT LONG TERM GOAL #5   Title Able to ambulate up and down stairs step over step.  Time 8   Period Weeks   Status Partially Met               Plan - 11/26/16 1339    Clinical Impression Statement Pt continues to do well in therapy, has shown some progression increasing her R knee ROM. A little anxious with step up and resisted walking. Some instability with resisted side steps. Reports that ice feel like a ton of bricks are on her knee  so she denied modality. Reports that she will ice at home.   Rehab Potential Good   PT Frequency 2x / week   PT Duration 8 weeks   PT Treatment/Interventions ADLs/Self Care Home Management;Cryotherapy;Electrical Stimulation;Stair training;Gait training;Therapeutic activities;Therapeutic exercise;Manual techniques;Patient/family education;Passive range of motion   PT Next Visit Plan Work on ROM and strengthening. Vasopneumatic and other modalities for pain.       Patient will benefit from skilled therapeutic intervention in order to improve the following deficits and impairments:  Abnormal gait, Decreased activity tolerance, Decreased mobility, Decreased endurance, Decreased range of motion, Decreased strength, Increased edema, Difficulty  walking, Increased muscle spasms, Impaired sensation, Postural dysfunction, Pain  Visit Diagnosis: Acute pain of right knee  Stiffness of right knee, not elsewhere classified  Difficulty in walking, not elsewhere classified  Localized swelling, mass and lump, right lower limb     Problem List Patient Active Problem List   Diagnosis Date Noted  . OA (osteoarthritis) of knee 08/16/2014  . Postoperative anemia due to acute blood loss 11/12/2013  . OA (osteoarthritis) of hip 11/11/2013  . Palpitations 03/11/2012    Ronald G Pemberton, PTA 11/26/2016, 1:42 PM  [facility] [address] [place], [place], [address] Phone: [phone]   Fax:  [phone]  Name: Margaruite C Ulloa MRN: [id number] Date of Birth: 02/10/1946   

## 2016-11-28 ENCOUNTER — Ambulatory Visit: Payer: Medicare Other | Attending: Orthopedic Surgery | Admitting: Physical Therapy

## 2016-11-28 ENCOUNTER — Encounter: Payer: Self-pay | Admitting: Physical Therapy

## 2016-11-28 DIAGNOSIS — R2241 Localized swelling, mass and lump, right lower limb: Secondary | ICD-10-CM | POA: Diagnosis present

## 2016-11-28 DIAGNOSIS — M25661 Stiffness of right knee, not elsewhere classified: Secondary | ICD-10-CM | POA: Diagnosis present

## 2016-11-28 DIAGNOSIS — M25561 Pain in right knee: Secondary | ICD-10-CM | POA: Insufficient documentation

## 2016-11-28 DIAGNOSIS — R262 Difficulty in walking, not elsewhere classified: Secondary | ICD-10-CM | POA: Insufficient documentation

## 2016-11-28 NOTE — Therapy (Signed)
[facility] [address] [place], [place], [address] Phone: [phone]   Fax:  [phone]  Physical Therapy Treatment  Patient Details  Name: Kimberly Mccullough MRN: [id number] Date of Birth: June 08, 1945 Referring Provider: Ollen GrossFrank Aluisio  Encounter Date: 11/28/2016      PT End of Session - 11/28/16 1352    Visit Number 6   Date for PT Re-Evaluation 01/07/17   PT Start Time 1301   PT Stop Time 1350   PT Time Calculation (min) 49 min   Activity Tolerance Patient tolerated treatment well   Behavior During Therapy [facility] for tasks assessed/performed      Past Medical History:  Diagnosis Date  . Arthritis   . Hyperlipidemia   . Hypertension   . No blood products   . Osteopenia   . PTSD (post-traumatic stress disorder)   . Rapid heart beat    takes metoprolol to treat / denies HBP  . Tinnitus    VERY MILD; DOES NOT AFFECT HEARING     Past Surgical History:  Procedure Laterality Date  . ABDOMINAL HYSTERECTOMY    . BUNIONECTOMY     BILATERAL  . CHOLECYSTECTOMY    . JOINT REPLACEMENT    . TOTAL HIP ARTHROPLASTY Left 11/11/2013   Procedure: LEFT TOTAL HIP ARTHROPLASTY ANTERIOR APPROACH;  Surgeon: Loanne DrillingFrank Aluisio V, MD;  [facility];  Service: Orthopedics;  Laterality: Left;  . TOTAL KNEE ARTHROPLASTY Left 08/16/2014   Procedure: LEFT TOTAL KNEE ARTHROPLASTY;  Surgeon: Ollen GrossFrank Aluisio, MD;  [facility];  Service: Orthopedics;  Laterality: Left;  . TOTAL KNEE ARTHROPLASTY Right 10/22/2016   Procedure: RIGHT TOTAL KNEE ARTHROPLASTY;  Surgeon: Ollen GrossAluisio, Frank, MD;  [facility];  Service: Orthopedics;  Laterality: Right;    There were no vitals filed for this visit.      Subjective Assessment - 11/28/16 1308    Subjective Pt reports that she is doing well. Said that she has some muscle spasms in her R quad, and thightness and "pressure" pain on the medial and lateral side of her R knee.    Patient Stated  Goals Reduce pain and improve ROM, return to prior level of function.    Currently in Pain? No/denies   Pain Score 0-No pain                         [facility] Adult PT Treatment/Exercise - 11/28/16 0001      Ambulation/Gait   Stairs Yes   Stairs Assistance 7: Independent   Stair Management Technique One rail Right  only for 1st half flight going down   Number of Stairs 24   Height of Stairs 6     Knee/Hip Exercises: Aerobic   Recumbent Bike 6 min, seat position 8     Knee/Hip Exercises: Machines for Strengthening   Cybex Knee Extension 5lb 1x10, 10# 2x10   Cybex Knee Flexion 25lb 2x15    Total Gym Leg Press 30lb 2x10 (postion 6)      Knee/Hip Exercises: Standing   Forward Step Up Right;2 sets;15 reps;Step Height: 8";Hand Hold: 1     Knee/Hip Exercises: Seated   Sit to Sand 3 sets;5 reps;without UE support  blue chair     Manual Therapy   Manual Therapy Passive ROM   Manual therapy comments some PROM take to end range and held    Passive ROM R knee flex & Ext  PT Short Term Goals - 11/14/16 1141      PT SHORT TERM GOAL #1   Title Independent with initial HEP.    Status Achieved           PT Long Term Goals - 11/28/16 1354      PT LONG TERM GOAL #5   Title Able to ambulate up and down stairs step over step.    Time 8   Period Weeks   Status Achieved               Plan - 11/28/16 1352    Clinical Impression Statement Pt did well today. We progressed most of her exercises and she was able to complete repeated sit-to-stands without UE support. Let her know that she will probably have more soreness after today's session and asked her to ice when she gets home.    PT Treatment/Interventions ADLs/Self Care Home Management;Cryotherapy;Quarry managerlectrical Stimulation;Stair training;Gait training;Therapeutic activities;Therapeutic exercise;Manual techniques;Patient/family education;Passive range of motion   PT Next Visit Plan  Work on ROM and strengthening. Vasopneumatic and other modalities for pain.    PT Home Exercise Plan Low load, long duration stretches for ROM. Advised that Pt could continue activites from HHPT too. Added heel slides, terminal knee extension, quad sets, and standing knee flexion.    Consulted and Agree with Plan of Care Patient      Patient will benefit from skilled therapeutic intervention in order to improve the following deficits and impairments:  Abnormal gait, Decreased activity tolerance, Decreased mobility, Decreased endurance, Decreased range of motion, Decreased strength, Increased edema, Difficulty walking, Increased muscle spasms, Impaired sensation, Postural dysfunction, Pain  Visit Diagnosis: Acute pain of right knee  Stiffness of right knee, not elsewhere classified  Difficulty in walking, not elsewhere classified  Localized swelling, mass and lump, right lower limb     Problem List Patient Active Problem List   Diagnosis Date Noted  . OA (osteoarthritis) of knee 08/16/2014  . Postoperative anemia due to acute blood loss 11/12/2013  . OA (osteoarthritis) of hip 11/11/2013  . Palpitations 03/11/2012    Carin PrimroseKatherine Bokchito Paone, SPT 11/28/2016, 1:58 PM  [facility] [address] [place], [place], [address] Phone: [phone]   Fax:  [phone]  Name: Kimberly Mccullough MRN: [id number] Date of Birth: [birth year]

## 2016-12-03 ENCOUNTER — Ambulatory Visit: Payer: Medicare Other | Admitting: Physical Therapy

## 2016-12-03 ENCOUNTER — Encounter: Payer: Self-pay | Admitting: Physical Therapy

## 2016-12-03 DIAGNOSIS — R2241 Localized swelling, mass and lump, right lower limb: Secondary | ICD-10-CM

## 2016-12-03 DIAGNOSIS — M25561 Pain in right knee: Secondary | ICD-10-CM

## 2016-12-03 DIAGNOSIS — M25661 Stiffness of right knee, not elsewhere classified: Secondary | ICD-10-CM

## 2016-12-03 DIAGNOSIS — R262 Difficulty in walking, not elsewhere classified: Secondary | ICD-10-CM

## 2016-12-03 NOTE — Therapy (Signed)
[facility] [address] [place], [place], [address] Phone: [phone]   Fax:  [phone]  Physical Therapy Treatment  Patient Details  Name: Kimberly Mccullough MRN: [id number] Date of Birth: 08-07-1945 Referring Provider: Ollen GrossFrank Aluisio  Encounter Date: 12/03/2016      PT End of Session - 12/03/16 1429    Visit Number 7   Date for PT Re-Evaluation 01/07/17   PT Start Time 1346   PT Stop Time 1425   PT Time Calculation (min) 39 min   Activity Tolerance Patient tolerated treatment well   Behavior During Therapy [facility] for tasks assessed/performed      Past Medical History:  Diagnosis Date  . Arthritis   . Hyperlipidemia   . Hypertension   . No blood products   . Osteopenia   . PTSD (post-traumatic stress disorder)   . Rapid heart beat    takes metoprolol to treat / denies HBP  . Tinnitus    VERY MILD; DOES NOT AFFECT HEARING     Past Surgical History:  Procedure Laterality Date  . ABDOMINAL HYSTERECTOMY    . BUNIONECTOMY     BILATERAL  . CHOLECYSTECTOMY    . JOINT REPLACEMENT    . TOTAL HIP ARTHROPLASTY Left 11/11/2013   Procedure: LEFT TOTAL HIP ARTHROPLASTY ANTERIOR APPROACH;  Surgeon: Loanne DrillingFrank Aluisio V, MD;  [facility];  Service: Orthopedics;  Laterality: Left;  . TOTAL KNEE ARTHROPLASTY Left 08/16/2014   Procedure: LEFT TOTAL KNEE ARTHROPLASTY;  Surgeon: Ollen GrossFrank Aluisio, MD;  [facility];  Service: Orthopedics;  Laterality: Left;  . TOTAL KNEE ARTHROPLASTY Right 10/22/2016   Procedure: RIGHT TOTAL KNEE ARTHROPLASTY;  Surgeon: Ollen GrossAluisio, Frank, MD;  [facility];  Service: Orthopedics;  Laterality: Right;    There were no vitals filed for this visit.      Subjective Assessment - 12/03/16 1348    Subjective "Im doing fine" Pt reports that she sill has some tightness   Currently in Pain? Yes   Pain Score 2    Pain Location Knee   Pain Orientation Right;Medial                          [facility] Adult PT Treatment/Exercise - 12/03/16 0001      Ambulation/Gait   Ambulation/Gait Yes   Ambulation/Gait Assistance 6: Modified independent (Device/Increase time)   Ambulation Distance (Feet) 260 Feet   Assistive device None   Gait Pattern Within Functional Limits;Step-through pattern   Ambulation Surface Level;Unlevel;Indoor;Outdoor;Paved     Knee/Hip Exercises: Aerobic   Recumbent Bike 4 min, seat position 8   Nustep L4 x 5 min      Knee/Hip Exercises: Machines for Strengthening   Cybex Knee Extension 10# 2x10   Cybex Knee Flexion 25lb 2x15    Total Gym Leg Press 30lb 3x10 (postion 6)                   PT Short Term Goals - 11/14/16 1141      PT SHORT TERM GOAL #1   Title Independent with initial HEP.    Status Achieved           PT Long Term Goals - 12/03/16 1430      PT LONG TERM GOAL #3   Title Ambulate in the community 1000 feet without pain and difficulty.    Status Achieved     PT LONG TERM GOAL #4  Title Increase strength to 4+/5 for R knee flexion and extension.    Status On-going     PT LONG TERM GOAL #5   Title Able to ambulate up and down stairs step over step.    Status Achieved               Plan - 12/03/16 1430    Clinical Impression Statement Pt continues to do well in therapy. Progressed to outdoor ambulation, pt did well ambulating up and down slopes. Good strength and ROM with all exercises   Rehab Potential Good   PT Frequency 2x / week   PT Duration 8 weeks   PT Treatment/Interventions ADLs/Self Care Home Management;Cryotherapy;Quarry manager;Therapeutic activities;Therapeutic exercise;Manual techniques;Patient/family education;Passive range of motion   PT Next Visit Plan gait and strengthening. Vasopneumatic and other modalities for pain.       Patient will benefit from skilled therapeutic intervention in order to improve the following  deficits and impairments:  Abnormal gait, Decreased activity tolerance, Decreased mobility, Decreased endurance, Decreased range of motion, Decreased strength, Increased edema, Difficulty walking, Increased muscle spasms, Impaired sensation, Postural dysfunction, Pain  Visit Diagnosis: Stiffness of right knee, not elsewhere classified  Acute pain of right knee  Difficulty in walking, not elsewhere classified  Localized swelling, mass and lump, right lower limb     Problem List Patient Active Problem List   Diagnosis Date Noted  . OA (osteoarthritis) of knee 08/16/2014  . Postoperative anemia due to acute blood loss 11/12/2013  . OA (osteoarthritis) of hip 11/11/2013  . Palpitations 03/11/2012    Grayce Sessions, PTA 12/03/2016, 2:39 PM  [facility] [address] [place], [place], [address] Phone: [phone]   Fax:  [phone]  Name: Kimberly Mccullough MRN: [id number] Date of Birth: [birth year]

## 2016-12-05 ENCOUNTER — Ambulatory Visit: Payer: Medicare Other | Admitting: Physical Therapy

## 2016-12-05 ENCOUNTER — Encounter: Payer: Self-pay | Admitting: Physical Therapy

## 2016-12-05 DIAGNOSIS — M25561 Pain in right knee: Secondary | ICD-10-CM | POA: Diagnosis not present

## 2016-12-05 DIAGNOSIS — M25661 Stiffness of right knee, not elsewhere classified: Secondary | ICD-10-CM

## 2016-12-05 DIAGNOSIS — R262 Difficulty in walking, not elsewhere classified: Secondary | ICD-10-CM

## 2016-12-05 NOTE — Therapy (Signed)
[place], [place], [address] Phone: [phone]   Fax:  [phone]  Physical Therapy Treatment  Patient Details  Name: Kimberly Mccullough MRN: [id number] Date of Birth: 1945-06-12 Referring Provider: Gaynelle Arabian  Encounter Date: 12/05/2016      PT End of Session - 12/05/16 1434    Visit Number 8   Date for PT Re-Evaluation 01/07/17   PT Start Time 1345   PT Stop Time 1433   PT Time Calculation (min) 48 min   Activity Tolerance Patient tolerated treatment well   Behavior During Therapy [facility] for tasks assessed/performed      Past Medical History:  Diagnosis Date  . Arthritis   . Hyperlipidemia   . Hypertension   . No blood products   . Osteopenia   . PTSD (post-traumatic stress disorder)   . Rapid heart beat    takes metoprolol to treat / denies HBP  . Tinnitus    VERY MILD; DOES NOT AFFECT HEARING     Past Surgical History:  Procedure Laterality Date  . ABDOMINAL HYSTERECTOMY    . BUNIONECTOMY     BILATERAL  . CHOLECYSTECTOMY    . JOINT REPLACEMENT    . TOTAL HIP ARTHROPLASTY Left 11/11/2013   Procedure: LEFT TOTAL HIP ARTHROPLASTY ANTERIOR APPROACH;  Surgeon: Gearlean Alf, MD;  [facility];  Service: Orthopedics;  Laterality: Left;  . TOTAL KNEE ARTHROPLASTY Left 08/16/2014   Procedure: LEFT TOTAL KNEE ARTHROPLASTY;  Surgeon: Gaynelle Arabian, MD;  [facility];  Service: Orthopedics;  Laterality: Left;  . TOTAL KNEE ARTHROPLASTY Right 10/22/2016   Procedure: RIGHT TOTAL KNEE ARTHROPLASTY;  Surgeon: Gaynelle Arabian, MD;  [facility];  Service: Orthopedics;  Laterality: Right;    There were no vitals filed for this visit.      Subjective Assessment - 12/05/16 1349    Subjective Pt reports that she is still having stiffness and loss of some sensation. She said that she went shopping recently and did a lot of walking and her knee felt good. She reports that she saw  Dr. Wynelle Link on 11/29/16 and he advised that she this would be her last physical therapy appointment.    Patient Stated Goals Reduce pain and improve ROM, return to prior level of function.    Currently in Pain? No/denies   Pain Score 0-No pain            [facility] PT Assessment - 12/05/16 0001      AROM   Right Knee Extension 4   Right Knee Flexion 100     PROM   Right Knee Extension 0   Right Knee Flexion 110     Strength   Right Knee Flexion 4/5   Right Knee Extension 4+/5                     [facility] Adult PT Treatment/Exercise - 12/05/16 0001      Knee/Hip Exercises: Aerobic   Recumbent Bike 6 min, seat position 7     Knee/Hip Exercises: Machines for Strengthening   Cybex Knee Extension 10# 2x15   Cybex Knee Flexion 25lb 2x15    Total Gym Leg Press 30lb 1x10, 40lb 2x10 (postion 6)      Knee/Hip Exercises: Standing   Forward Step Up Right;2 sets;15 reps;Step Height: 8";Hand Hold: 1     Knee/Hip Exercises: Seated   Sit to Sand 2 sets;10 reps;without UE support  PT Education - 12/07/2016 1433    Education provided Yes   Education Details HEP and progression for the exercises that we've done with her.    Person(s) Educated Patient   Methods Explanation;Demonstration;Verbal cues;Handout   Comprehension Verbalized understanding;Returned demonstration          PT Short Term Goals - 11/14/16 1141      PT SHORT TERM GOAL #1   Title Independent with initial HEP.    Status Achieved           PT Long Term Goals - 07-Dec-2016 1435      PT LONG TERM GOAL #1   Title Independent with advanced HEP, gym routine.    Baseline at gym   Time 8   Period Weeks   Status Achieved     PT LONG TERM GOAL #2   Title Improve right knee flexion to 120 degrees   Baseline R knee flexion PROM 110 degrees - 2016/12/07   Time 8   Period Weeks   Status Partially Met     PT LONG TERM GOAL #4   Title Increase strength to 4+/5 for R knee flexion and  extension.    Baseline Extension 4+/5, flexion 4/5 - 2016/12/07   Time 8   Period Weeks   Status Partially Met               Plan - 12/07/2016 1711    Clinical Impression Statement Per Pt she saw her MD on 11/29/16 and he advise that she no longer needs to continue PT. We wrote down the exercises that we have been doing with her and explained how to progress them. She reports that she has access to a gym and she will continue doing them there. She has met all of her goals except her ROM in flexion is still limited to 100 actively and 110 passively, and her strength in flexion is 4/5.    PT Treatment/Interventions ADLs/Self Care Home Management;Cryotherapy;Associate Professor;Therapeutic activities;Therapeutic exercise;Manual techniques;Patient/family education;Passive range of motion   PT Next Visit Plan Pt will be discharged today. She has been given a copy of the exercises that we have done with her and was instructed how to progress them.    PT Home Exercise Plan Continue with knee flexion and advanced HEP.    Consulted and Agree with Plan of Care Patient      Patient will benefit from skilled therapeutic intervention in order to improve the following deficits and impairments:  Abnormal gait, Decreased activity tolerance, Decreased mobility, Decreased endurance, Decreased range of motion, Decreased strength, Increased edema, Difficulty walking, Increased muscle spasms, Impaired sensation, Postural dysfunction, Pain  Visit Diagnosis: Stiffness of right knee, not elsewhere classified  Acute pain of right knee  Difficulty in walking, not elsewhere classified       G-Codes - 12/07/16 1432    Functional Assessment Tool Used (Outpatient Only) foto 44% limitation   Functional Limitation Mobility: Walking and moving around   Mobility: Walking and Moving Around Current Status [phone]) At least 40 percent but less than 60 percent impaired, limited or restricted    Mobility: Walking and Moving Around Goal Status [phone]) At least 40 percent but less than 60 percent impaired, limited or restricted   Mobility: Walking and Moving Around Discharge Status [phone]) At least 40 percent but less than 60 percent impaired, limited or restricted      Problem List Patient Active Problem List   Diagnosis Date Noted  . OA (osteoarthritis) of  knee 08/16/2014  . Postoperative anemia due to acute blood loss 11/12/2013  . OA (osteoarthritis) of hip 11/11/2013  . Palpitations 03/11/2012    Kimberly Mccullough, SPT 12/05/2016, 5:31 PM  [place], [place], [address] Phone: [phone]   Fax:  [phone]  Name: KENESHA MOSHIER MRN: [id number] Date of Birth: [birth year]

## 2017-01-30 NOTE — Progress Notes (Signed)
Chief Complaint  Patient presents with  . Follow-up    palpitations   History of Present Illness: 71 yo female with history of HLD, HTN, PTSD, palpitations who is here today for cardiac follow up. She had negative cardiac monitors in 2013 and 2017. She is not known to have CAD. She is a TEFL teacher witness. Stress echo in 2014 without ischemia. Exercise stress test in 2017 without ischemic EKG changes.   She is here today for follow up. The patient denies any chest pain, dyspnea, palpitations, lower extremity edema, orthopnea, PND, dizziness, near syncope or syncope.    Primary Care Physician: Eliott Nine, MD  Past Medical History:  Diagnosis Date  . Arthritis   . Hyperlipidemia   . Hypertension   . No blood products   . Osteopenia   . PTSD (post-traumatic stress disorder)   . Rapid heart beat    takes metoprolol to treat / denies HBP  . Tinnitus    VERY MILD; DOES NOT AFFECT HEARING     Past Surgical History:  Procedure Laterality Date  . ABDOMINAL HYSTERECTOMY    . BUNIONECTOMY     BILATERAL  . CHOLECYSTECTOMY    . JOINT REPLACEMENT    . TOTAL HIP ARTHROPLASTY Left 11/11/2013   Procedure: LEFT TOTAL HIP ARTHROPLASTY ANTERIOR APPROACH;  Surgeon: Loanne Drilling, MD;  [facility];  Service: Orthopedics;  Laterality: Left;  . TOTAL KNEE ARTHROPLASTY Left 08/16/2014   Procedure: LEFT TOTAL KNEE ARTHROPLASTY;  Surgeon: Ollen Gross, MD;  [facility];  Service: Orthopedics;  Laterality: Left;  . TOTAL KNEE ARTHROPLASTY Right 10/22/2016   Procedure: RIGHT TOTAL KNEE ARTHROPLASTY;  Surgeon: Ollen Gross, MD;  [facility];  Service: Orthopedics;  Laterality: Right;    Current Outpatient Prescriptions  Medication Sig Dispense Refill  . aspirin 81 MG tablet Take 81 mg by mouth daily.    Marland Kitchen FIBER ADULT GUMMIES PO Take 2 tablets by mouth daily.    . meloxicam (MOBIC) 15 MG tablet Take 15 mg by mouth daily.    . verapamil (CALAN-SR) 120 MG CR tablet Take  120 mg by mouth 2 (two) times daily.      No current facility-administered medications for this visit.     Allergies  Allergen Reactions  . Statins Other (See Comments)    myalgia  . Other     Pt is Jehovas Witness and does not want any blood products Pt is Jehovas Witness and does not want any blood products    Social History   Social History  . Marital status: Widowed    Spouse name: N/A  . Number of children: 3  . Years of education: N/A   Occupational History  . Legal assistant [employer]   Social History Main Topics  . Smoking status: Never Smoker  . Smokeless tobacco: Never Used  . Alcohol use No  . Drug use: No  . Sexual activity: Not Currently   Other Topics Concern  . Not on file   Social History Narrative  . No narrative on file    Family History  Problem Relation Age of Onset  . Dementia Mother   . Liver disease Father   . CAD Neg Hx     Review of Systems:  As stated in the HPI and otherwise negative.   BP 126/80   Pulse 79   Ht  (1.778 m)   Wt 163 lb (73.9 kg)   SpO2 98%   BMI 23.39  kg/m   Physical Examination: General: Well developed, well nourished, NAD  HEENT: OP clear, mucus membranes moist  SKIN: warm, dry. No rashes. Neuro: No focal deficits  Musculoskeletal: Muscle strength 5/5 all ext  Psychiatric: Mood and affect normal  Neck: No JVD, no carotid bruits, no thyromegaly, no lymphadenopathy.  Lungs:Clear bilaterally, no wheezes, rhonci, crackles Cardiovascular: Regular rate and rhythm. No murmurs, gallops or rubs. Abdomen:Soft. Bowel sounds present. Non-tender.  Extremities: No lower extremity edema. Pulses are 2 + in the bilateral DP/PT.  EKG:  EKG is not ordered today. The ekg ordered today demonstrates   Recent Labs: 10/16/2016: ALT 19 10/24/2016: BUN 23; Creatinine, Ser 0.73; Hemoglobin 10.9; Platelets 196; Potassium 3.9; Sodium 138   Lipid Panel    Component Value Date/Time   CHOL 222 (H) 11/14/2012 1133   TRIG  61.0 11/14/2012 1133   HDL 53.70 11/14/2012 1133   CHOLHDL 4 11/14/2012 1133   VLDL 12.2 11/14/2012 1133   LDLDIRECT 167.1 11/14/2012 1133     Wt Readings from Last 3 Encounters:  01/31/17 163 lb (73.9 kg)  10/22/16 166 lb (75.3 kg)  10/16/16 169 lb 12.8 oz (77 kg)     Other studies Reviewed: Additional studies/ records that were reviewed today include: . Review of the above records demonstrates:   Assessment and Plan:   1. HTN: BP is controlled.   2. Palpitations: No arrhythmias found on cardiac monitor in 2017. Anxiety is felt to play a role in her palpitations.   Current medicines are reviewed at length with the patient today.  The patient does not have concerns regarding medicines.  The following changes have been made:  no change  Labs/ tests ordered today include:  No orders of the defined types were placed in this encounter.  Disposition:   FU with me in 12 months  Signed, Verne Carrow, MD 01/31/2017 11:32 AM    [facility] [address] [place], [place], [place]  [address] Phone: [phone]; Fax: [phone]

## 2017-01-31 ENCOUNTER — Ambulatory Visit (INDEPENDENT_AMBULATORY_CARE_PROVIDER_SITE_OTHER): Payer: Medicare Other | Admitting: Cardiovascular Disease

## 2017-01-31 ENCOUNTER — Encounter: Payer: Self-pay | Admitting: Cardiovascular Disease

## 2017-01-31 VITALS — BP 126/80 | HR 79 | Ht 70.0 in | Wt 163.0 lb

## 2017-01-31 DIAGNOSIS — R002 Palpitations: Secondary | ICD-10-CM

## 2017-01-31 DIAGNOSIS — I10 Essential (primary) hypertension: Secondary | ICD-10-CM | POA: Diagnosis not present

## 2017-01-31 NOTE — Patient Instructions (Signed)

## 2017-05-17 ENCOUNTER — Other Ambulatory Visit: Payer: Self-pay | Admitting: Cardiovascular Disease

## 2017-05-17 MED ORDER — VERAPAMIL HCL ER 120 MG PO TBCR: 120.0000 mg | EXTENDED_RELEASE_TABLET | Freq: Two times a day (BID) | ORAL | 2 refills | Status: DC

## 2017-06-10 NOTE — Telephone Encounter (Signed)
ERROR

## 2017-12-02 ENCOUNTER — Other Ambulatory Visit: Payer: Self-pay | Admitting: Obstetrics and Gynecology

## 2017-12-02 DIAGNOSIS — E2839 Other primary ovarian failure: Secondary | ICD-10-CM

## 2018-01-03 ENCOUNTER — Other Ambulatory Visit: Payer: Self-pay | Admitting: Cardiovascular Disease

## 2018-01-13 ENCOUNTER — Ambulatory Visit
Admission: RE | Admit: 2018-01-13 | Discharge: 2018-01-13 | Disposition: A | Discharge status: Home / Self Care | Payer: Medicare Other | Source: Ambulatory Visit | Attending: Obstetrics and Gynecology | Admitting: Obstetrics and Gynecology

## 2018-01-13 DIAGNOSIS — E2839 Other primary ovarian failure: Secondary | ICD-10-CM

## 2018-01-23 ENCOUNTER — Telehealth: Payer: Self-pay | Admitting: Neurology

## 2018-01-23 ENCOUNTER — Ambulatory Visit: Payer: Medicare Other | Admitting: Neurology

## 2018-01-23 ENCOUNTER — Encounter: Payer: Self-pay | Admitting: Neurology

## 2018-01-23 ENCOUNTER — Other Ambulatory Visit: Payer: Self-pay

## 2018-01-23 VITALS — BP 166/96 | HR 74 | Resp 18 | Ht 70.0 in | Wt 159.5 lb

## 2018-01-23 DIAGNOSIS — R258 Other abnormal involuntary movements: Secondary | ICD-10-CM | POA: Insufficient documentation

## 2018-01-23 DIAGNOSIS — M6281 Muscle weakness (generalized): Secondary | ICD-10-CM

## 2018-01-23 DIAGNOSIS — R269 Unspecified abnormalities of gait and mobility: Secondary | ICD-10-CM | POA: Diagnosis not present

## 2018-01-23 NOTE — Progress Notes (Signed)
PATIENTJANAT Mccullough DOB: 1946-03-23  Chief Complaint  Patient presents with  . Decreased Sense of Smell    New Room. PCP: Michelle Nasuti. Sts. she is concerned she may have Parkinson's Disease due to decreased sense of smell and poor handwriting. Sts. her son's mother-in-law has Parkinson's Disease.  Sts. she frequently asks her pcp about the possibility of PD; he has told her arthritis has affected her handwriting. "I'm just anxious about everything."/fim     HISTORICAL  Kimberly Mccullough is a 72 year old female, seen in request by her primary care PA Kerin Salen, for evaluation of decreased sense of smell, concerning about Parkinson's disease, initial evaluation was on January 23, 2018.  I have reviewed and summarized the referring note from the referring physician.  She had past medical history of hypertension, status post bilateral knee replacement, left hip replacement.  She lives alone since her husband passed away in Sep 29, 2010  She noticed gradual onset loss of sense of smell for the past couple years, was noted by her son that she has mild difficulty clear her foot from the floor sometimes, she denies significant tremor, mild unsteady gait she contributed to her joints replacement.  She also was noted to be acting out of dreams, talking out of her drain, she exercise regularly, she is Jehovah's Witness,  visiting people's house regularly  Laboratory evaluation in September 2019: Normal CMP, creatinine of 0.88, total cholesterol was 223, TSH 2.9  REVIEW OF SYSTEMS: Full 14 system review of systems performed and notable only for easy bruising, anxiety, tremor All other review of systems were negative.  ALLERGIES: Allergies  Allergen Reactions  . Statins Other (See Comments)    myalgia  . Other     Pt is Jehovas Witness and does not want any blood products Pt is Jehovas Witness and does not want any blood products    HOME MEDICATIONS: Current  Outpatient Medications  Medication Sig Dispense Refill  . aspirin 81 MG tablet Take 81 mg by mouth daily.    Marland Kitchen BIOTIN FORTE PO Take by mouth.    . FIBER ADULT GUMMIES PO Take 2 tablets by mouth daily.    . Glucosamine-Chondroit-Vit C-Mn (GLUCOSAMINE 1500 COMPLEX) CAPS Take by mouth.    . meloxicam (MOBIC) 15 MG tablet Take 15 mg by mouth daily.    . Multiple Vitamins-Minerals (MULTIVITAMIN ADULTS PO) Take by mouth.    . Omega-3 Fatty Acids (FISH OIL) 1000 MG CAPS Take by mouth.    . verapamil (CALAN-SR) 120 MG CR tablet TAKE 1 TABLET BY MOUTH TWO  TIMES DAILY 180 tablet 0  . vitamin E (VITAMIN E) 400 UNIT capsule Take 400 Units by mouth daily.     No current facility-administered medications for this visit.     PAST MEDICAL HISTORY: Past Medical History:  Diagnosis Date  . Arthritis   . Hyperlipidemia   . Hypertension   . No blood products   . Osteopenia   . PTSD (post-traumatic stress disorder)   . Rapid heart beat    takes metoprolol to treat / denies HBP  . Tinnitus    VERY MILD; DOES NOT AFFECT HEARING     PAST SURGICAL HISTORY: Past Surgical History:  Procedure Laterality Date  . ABDOMINAL HYSTERECTOMY    . BUNIONECTOMY     BILATERAL  . CHOLECYSTECTOMY    . JOINT REPLACEMENT    . TOTAL HIP ARTHROPLASTY Left 11/11/2013   Procedure: LEFT TOTAL HIP ARTHROPLASTY ANTERIOR APPROACH;  Surgeon: Loanne Drilling, MD;  Location: [facility];  Service: Orthopedics;  Laterality: Left;  . TOTAL KNEE ARTHROPLASTY Left 08/16/2014   Procedure: LEFT TOTAL KNEE ARTHROPLASTY;  Surgeon: Ollen Gross, MD;  Location: [facility];  Service: Orthopedics;  Laterality: Left;  . TOTAL KNEE ARTHROPLASTY Right 10/22/2016   Procedure: RIGHT TOTAL KNEE ARTHROPLASTY;  Surgeon: Ollen Gross, MD;  Location: [facility];  Service: Orthopedics;  Laterality: Right;    FAMILY HISTORY: Family History  Problem Relation Age of Onset  . Dementia Mother   . Liver disease Father   . CAD Neg Hx     SOCIAL  HISTORY: Social History   Socioeconomic History  . Marital status: Widowed    Spouse name: Not on file  . Number of children: 3  . Years of education: Not on file  . Highest education level: Not on file  Occupational History  . Occupation: Cytogeneticist: [employer]  Social Needs  . Financial resource strain: Not on file  . Food insecurity:    Worry: Not on file    Inability: Not on file  . Transportation needs:    Medical: Not on file    Non-medical: Not on file  Tobacco Use  . Smoking status: Never Smoker  . Smokeless tobacco: Never Used  Substance and Sexual Activity  . Alcohol use: No  . Drug use: No  . Sexual activity: Not Currently  Lifestyle  . Physical activity:    Days per week: Not on file    Minutes per session: Not on file  . Stress: Not on file  Relationships  . Social connections:    Talks on phone: Not on file    Gets together: Not on file    Attends religious service: Not on file    Active member of club or organization: Not on file    Attends meetings of clubs or organizations: Not on file    Relationship status: Not on file  . Intimate partner violence:    Fear of current or ex partner: Not on file    Emotionally abused: Not on file    Physically abused: Not on file    Forced sexual activity: Not on file  Other Topics Concern  . Not on file  Social History Narrative  . Not on file     PHYSICAL EXAM   Vitals:   01/23/18 0836  BP: (!) 166/96  Pulse: 74  Resp: 18  Weight: 159 lb 8 oz (72.3 kg)  Height: 5\' 10"  (1.778 m)    Not recorded      Body mass index is 22.89 kg/m.  PHYSICAL EXAMNIATION:  Gen: NAD, conversant, well nourised, obese, well groomed                     Cardiovascular: Regular rate rhythm, no peripheral edema, warm, nontender. Eyes: Conjunctivae clear without exudates or hemorrhage Neck: Supple, no carotid bruits. Pulmonary: Clear to auscultation bilaterally   NEUROLOGICAL EXAM: MMSE - Mini  Mental State Exam 01/23/2018  Orientation to time 5  Orientation to Place 5  Registration 3  Attention/ Calculation 5  Recall 3  Language- name 2 objects 2  Language- repeat 1  Language- follow 3 step command 3  Language- read & follow direction 1  Write a sentence 1  Copy design 0  Total score 29  animal naming 10  CRANIAL NERVES: CN II: Visual fields are full to confrontation. Fundoscopic exam is normal with  sharp discs and no vascular changes. Pupils are round equal and briskly reactive to light. CN III, IV, VI: extraocular movement are normal. No ptosis. CN V: Facial sensation is intact to pinprick in all 3 divisions bilaterally. Corneal responses are intact.  CN VII: Face is symmetric with normal eye closure and smile. CN VIII: Hearing is normal to rubbing fingers CN IX, X: Palate elevates symmetrically. Phonation is normal. CN XI: Head turning and shoulder shrug are intact CN XII: Tongue is midline with normal movements and no atrophy.  MOTOR: There was no resting tremor, mild rigidity, left worse than right, mild difficulty with finger to wrist opening and closure, left less than right, loss of rhythm with left foot tapping, there was no muscle weakness  REFLEXES: Reflexes are 2+ and symmetric at the biceps, triceps, knees, and ankles. Plantar responses are flexor.  SENSORY: Intact to light touch, pinprick, positional sensation and vibratory sensation are intact in fingers and toes.  COORDINATION: Rapid alternating movements and fine finger movements are intact. There is no dysmetria on finger-to-nose and heel-knee-shin.    GAIT/STANCE: She lean forward, good stride, smooth turning  DIAGNOSTIC DATA (LABS, IMAGING, TESTING) - I reviewed patient records, labs, notes, testing and imaging myself where available.   ASSESSMENT AND PLAN  Kimberly Mccullough is a 72 y.o. female   Gradual onset loss of sense of smell, suggestion of REM sleep disorder, mild rigidity,  bradykinesia on examination,  Only very subtle signs on exam, not enough to make a diagnosis of Parkinson's disease at this point  I have suggested her MRI of the brain to rule out structural lesion  May consider DatScan  Continue moderate exercise   Kimberly Mccullough, M.D. Ph.D.  [facility] [address] [place], [place] [address] Ph: [phone] Fax: [phone]  CC: Kerin Salen, [place]

## 2018-01-23 NOTE — Telephone Encounter (Signed)
[employer] Medicare order sent to GI. No auth they will reach out to the pt to schedule.  °

## 2018-02-03 ENCOUNTER — Ambulatory Visit: Payer: Medicare Other | Admitting: Nurse Practitioner

## 2018-02-03 ENCOUNTER — Encounter: Payer: Self-pay | Admitting: Nurse Practitioner

## 2018-02-03 VITALS — BP 140/98 | HR 82 | Ht 70.0 in | Wt 160.8 lb

## 2018-02-03 DIAGNOSIS — F419 Anxiety disorder, unspecified: Secondary | ICD-10-CM | POA: Diagnosis not present

## 2018-02-03 DIAGNOSIS — R5382 Chronic fatigue, unspecified: Secondary | ICD-10-CM | POA: Diagnosis not present

## 2018-02-03 DIAGNOSIS — I10 Essential (primary) hypertension: Secondary | ICD-10-CM

## 2018-02-03 DIAGNOSIS — R002 Palpitations: Secondary | ICD-10-CM | POA: Diagnosis not present

## 2018-02-03 MED ORDER — VERAPAMIL HCL ER 120 MG PO TBCR: 120.0000 mg | EXTENDED_RELEASE_TABLET | Freq: Two times a day (BID) | ORAL | 3 refills | Status: DC

## 2018-02-03 NOTE — Patient Instructions (Addendum)
We will be checking the following labs today - NONE  If you have labs (blood work) drawn today and your tests are completely normal, you will receive your results only by: Marland Kitchen MyChart Message (if you have MyChart) OR . A paper copy in the mail If you have any lab test that is abnormal or we need to change your treatment, we will call you to review the results.  Medication Instructions:    Continue with your current medicines.   I have sent in your refill for your Verapamil    If you need a refill on your cardiac medications before your next appointment, please call your pharmacy.    Testing/Procedures To Be Arranged:  N/A  Follow-Up:   See me in one year.    At [facility], you and your health needs are our priority.  As part of our continuing mission to provide you with exceptional heart care, we have created designated Provider Care Teams.  These Care Teams include your primary Cardiologist (physician) and Advanced Practice Providers (APPs -  Physician Assistants and Nurse Practitioners) who all work together to provide you with the care you need, when you need it.   Other Special Instructions:   I would call and get the MRI arranged.     If you need a refill on your cardiac medications before your next appointment, please call your pharmacy.   Call the [facility] office at [phone] if you have any questions, problems or concerns.

## 2018-02-03 NOTE — Progress Notes (Signed)
CARDIOLOGY OFFICE NOTE  Date:  02/03/2018    Kimberly Mccullough Date of Birth: Jan 11, 1946 Medical Record #[id number]  PCP:  Kerin Salen, PA-C  Cardiologist:  Titus Mould    Chief Complaint  Patient presents with  . Hypertension  . Palpitations    Follow up visit -seen for Dr. Clifton James    History of Present Illness: Kimberly Mccullough is a 72 y.o. female who presents today for a follow up visit. Seen for Dr. Clifton James.   She has a history of HLD, HTN, PTSD, & palpitations.  She had negative cardiac monitors in 2013 and 2017. She is not known to have CAD. She is a TEFL teacher witness. Stress echo in 2014 without ischemia. Exercise stress test in 2017 without ischemic EKG changes.   Last seen by me in May of 2018. Last saw Dr. Clifton James in October of 2018.   Comes in today. Here alone. She feels like her heart is doing ok. No chest pain. No real palpitations mentioned. She is more fixated on the possibility of having Parkinson's. She has seen neurology. She was to have MRI - she feels too overwhelmed/anxious about having due to possible results. She has a tremor. She admits she is very anxious. Currently not getting any counseling. BP better at home. 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   Past Medical History:  Diagnosis Date  . Arthritis   . Hyperlipidemia   . Hypertension   . No blood products   . Osteopenia   . PTSD (post-traumatic stress disorder)   . Rapid heart beat    takes metoprolol to treat / denies HBP  . Tinnitus    VERY MILD; DOES NOT AFFECT HEARING     Past Surgical History:  Procedure Laterality Date  . ABDOMINAL HYSTERECTOMY    . BUNIONECTOMY     BILATERAL  . CHOLECYSTECTOMY    . JOINT REPLACEMENT    . TOTAL HIP ARTHROPLASTY Left 11/11/2013   Procedure: LEFT TOTAL HIP ARTHROPLASTY ANTERIOR APPROACH;  Surgeon: Loanne Drilling, MD;  Location: [facility];  Service: Orthopedics;  Laterality: Left;  .  TOTAL KNEE ARTHROPLASTY Left 08/16/2014   Procedure: LEFT TOTAL KNEE ARTHROPLASTY;  Surgeon: Ollen Gross, MD;  Location: [facility];  Service: Orthopedics;  Laterality: Left;  . TOTAL KNEE ARTHROPLASTY Right 10/22/2016   Procedure: RIGHT TOTAL KNEE ARTHROPLASTY;  Surgeon: Ollen Gross, MD;  Location: [facility];  Service: Orthopedics;  Laterality: Right;     Medications: Current Meds  Medication Sig  . Ascorbic Acid (VITAMIN C ER PO) Take 600 mg by mouth 2 (two) times daily.  Marland Kitchen aspirin 81 MG tablet Take 81 mg by mouth daily.  Marland Kitchen BIOTIN FORTE PO Take by mouth.  . FIBER ADULT GUMMIES PO Take 2 tablets by mouth daily.  . Glucosamine-Chondroit-Vit C-Mn (GLUCOSAMINE 1500 COMPLEX) CAPS Take by mouth.  . meloxicam (MOBIC) 15 MG tablet Take 15 mg by mouth daily.  . Multiple Vitamins-Minerals (MULTIVITAMIN ADULTS PO) Take by mouth.  . Omega-3 Fatty Acids (FISH OIL) 1000 MG CAPS Take by mouth.  . verapamil (CALAN-SR) 120 MG CR tablet Take 1 tablet (120 mg total) by mouth 2 (two) times daily.  . vitamin E (VITAMIN E) 400 UNIT capsule Take 400 Units by mouth daily.  . [DISCONTINUED] verapamil (CALAN-SR) 120 MG CR tablet TAKE 1 TABLET BY MOUTH TWO  TIMES DAILY     Allergies: Allergies  Allergen Reactions  .  Statins Other (See Comments)    myalgia  . Other     Pt is Jehovas Witness and does not want any blood products Pt is Jehovas Witness and does not want any blood products    Social History: The patient  reports that she has never smoked. She has never used smokeless tobacco. She reports that she does not drink alcohol or use drugs.   Family History: The patient's family history includes Dementia in her mother; Liver disease in her father.   Review of Systems: Please see the history of present illness.   Otherwise, the review of systems is positive for none.   All other systems are reviewed and negative.   Physical Exam: VS:  BP (!) 140/98   Pulse 82   Ht 5\' 10"  (1.778 m)   Wt 160 lb  12.8 oz (72.9 kg)   SpO2 98%   BMI 23.07 kg/m  .  BMI Body mass index is 23.07 kg/m.  Wt Readings from Last 3 Encounters:  02/03/18 160 lb 12.8 oz (72.9 kg)  01/23/18 159 lb 8 oz (72.3 kg)  01/31/17 163 lb (73.9 kg)   BP is 132/80 by me.   General: Flat affect. She is a little anxious today but alert and in no acute distress.   HEENT: Normal.  Neck: Supple, no JVD, carotid bruits, or masses noted.  Cardiac: Regular rate and rhythm. No murmurs, rubs, or gallops. No edema.  Respiratory:  Lungs are clear to auscultation bilaterally with normal work of breathing.  GI: Soft and nontender.  MS: No deformity or atrophy. Gait and ROM intact.  Skin: Warm and dry. Color is normal.  Neuro:  Strength and sensation are intact and no gross focal deficits noted.  Psych: Alert, appropriate and with normal affect.   LABORATORY DATA:  EKG:  EKG is ordered today. This demonstrates NSR.  Lab Results  Component Value Date   WBC 8.3 10/24/2016   HGB 10.9 (L) 10/24/2016   HCT 31.3 (L) 10/24/2016   PLT 196 10/24/2016   GLUCOSE 111 (H) 10/24/2016   CHOL 222 (H) 11/14/2012   TRIG 61.0 11/14/2012   HDL 53.70 11/14/2012   LDLDIRECT 167.1 11/14/2012   ALT 19 10/16/2016   AST 26 10/16/2016   NA 138 10/24/2016   K 3.9 10/24/2016   CL 108 10/24/2016   CREATININE 0.73 10/24/2016   BUN 23 (H) 10/24/2016   CO2 25 10/24/2016   TSH 1.63 10/29/2013   INR 0.93 10/16/2016     BNP (last 3 results) No results for input(s): BNP in the last 8760 hours.  ProBNP (last 3 results) No results for input(s): PROBNP in the last 8760 hours.   Other Studies Reviewed Today:  Holter Study Highlights 2017  Sinus rhythm with sinus pauses. Longest pause is 2.0 seconds.  Frequent premature atrial contractions Rare premature ventricular contractions  If she is having dizziness or near syncope, would need to hold Verapamil.     GXT Study Highlights 2017   There was no ST segment deviation noted during  stress.  Blood pressure demonstrated a hypertensive response to exercise.  Negative exercise treadmill test at an adequate workload and heart rate achieved.       Assessment/Plan: 1. HTN - Recheck by me is fine. No changes made today.   2. Palpitations - not endorsed  3. Tremor - she is convinced she has Parkinson's - I have encouraged her to proceed with the MRI and neuro follow up.  4. PTSD/anxiety - still a driving factor.   Current medicines are reviewed with the patient today.  The patient does not have concerns regarding medicines other than what has been noted above.  The following changes have been made:  See above.  Labs/ tests ordered today include:    Orders Placed This Encounter  Procedures  . EKG 12-Lead     Disposition:   FU with me in one year.    Patient is agreeable to this plan and will call if any problems develop in the interim.   SignedNorma Fredrickson, NP  02/03/2018 9:53 AM  [facility] [address] [place], [place]  [address] Phone: [phone] Fax: [phone]

## 2018-02-10 ENCOUNTER — Ambulatory Visit
Admission: RE | Admit: 2018-02-10 | Discharge: 2018-02-10 | Disposition: A | Discharge status: Home / Self Care | Payer: Medicare Other | Source: Ambulatory Visit | Attending: Neurology | Admitting: Neurology

## 2018-02-10 DIAGNOSIS — M6281 Muscle weakness (generalized): Secondary | ICD-10-CM

## 2018-02-11 ENCOUNTER — Telehealth: Payer: Self-pay | Admitting: Neurology

## 2018-02-11 NOTE — Telephone Encounter (Signed)
I called pt and reviewed her MRI brain results with her. Pt will follow up at her appt in January and call us for any questions or concerns in the meantime. Pt verbalized understanding of results. Pt had no questions at this time but was encouraged to call back if questions arise.

## 2018-02-11 NOTE — Telephone Encounter (Signed)
Please call patient, MRI of the brain showed mild age-related changes, there was no significant abnormality, I will review films with her at next follow-up visit.  IMPRESSION: This MRI of the brain without contrast shows the following: 1.   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 2.   There are no acute findings.

## 2018-05-26 ENCOUNTER — Encounter: Payer: Self-pay | Admitting: Neurology

## 2018-05-26 ENCOUNTER — Ambulatory Visit: Payer: Medicare Other | Admitting: Neurology

## 2018-05-26 VITALS — BP 124/77 | HR 89 | Ht 70.0 in | Wt 159.0 lb

## 2018-05-26 DIAGNOSIS — G20 Parkinson's disease: Secondary | ICD-10-CM | POA: Diagnosis not present

## 2018-05-26 DIAGNOSIS — R269 Unspecified abnormalities of gait and mobility: Secondary | ICD-10-CM | POA: Diagnosis not present

## 2018-05-26 NOTE — Progress Notes (Signed)
Kimberly Mccullough DOB: 1945-10-07  Chief Complaint  Patient presents with  . Bradykinesia    Feels her slow movement is no worse.  States her tremors have improved.  She would like tor review her brain MRI today.     HISTORICAL  Kimberly Mccullough is a 73 year old female, seen in request by her primary care PA Kerin Salen, for evaluation of decreased sense of smell, concerning about Parkinson's disease, initial evaluation was on January 23, 2018.  I have reviewed and summarized the referring note from the referring physician.  She had past medical history of hypertension, status post bilateral knee replacement, left hip replacement.  She lives alone since her husband passed away in 08-30-10  She noticed gradual onset loss of sense of smell for the past couple years, was noted by her son that she has mild difficulty clear her foot from the floor sometimes, she denies significant tremor, mild unsteady gait she contributed to her joints replacement.  She also was noted to be acting out of dreams, talking out of her drain, she exercise regularly, she is Jehovah's Witness,  visiting people's house regularly  Laboratory evaluation in September 2019: Normal CMP, creatinine of 0.88, total cholesterol was 223, TSH 2.9  UPDATE May 26 2018: She has intermittent body twitching, she does exercise regularly, 3 times a week, continue has intermittent head shaking,          I personally reviewed MRI brain in October 2019, mild generalized atrophy, supratentorium small vessel disease, there was no acute abnormality.                                                    REVIEW OF SYSTEMS: Full 14 system review of systems performed and notable only for as above All other review of systems were negative.  ALLERGIES: Allergies  Allergen Reactions  . Statins Other (See Comments)    myalgia  . Other     Pt is Jehovas Witness and does not want any blood products Pt is Jehovas  Witness and does not want any blood products    HOME MEDICATIONS: Current Outpatient Medications  Medication Sig Dispense Refill  . alendronate (FOSAMAX) 70 MG tablet Take by mouth.    . Ascorbic Acid (VITAMIN C ER PO) Take 600 mg by mouth 2 (two) times daily.    Marland Kitchen aspirin 81 MG tablet Take 81 mg by mouth daily.    Marland Kitchen BIOTIN FORTE PO Take by mouth.    . FIBER ADULT GUMMIES PO Take 2 tablets by mouth daily.    . Glucosamine-Chondroit-Vit C-Mn (GLUCOSAMINE 1500 COMPLEX) CAPS Take by mouth.    . meloxicam (MOBIC) 15 MG tablet Take 15 mg by mouth daily.    . Multiple Vitamins-Minerals (MULTIVITAMIN ADULTS PO) Take by mouth.    . Omega-3 Fatty Acids (FISH OIL) 1000 MG CAPS Take by mouth.    . verapamil (CALAN-SR) 120 MG CR tablet Take 1 tablet (120 mg total) by mouth 2 (two) times daily. 180 tablet 3  . vitamin E (VITAMIN E) 400 UNIT capsule Take 400 Units by mouth daily.     No current facility-administered medications for this visit.     PAST MEDICAL HISTORY: Past Medical History:  Diagnosis Date  . Arthritis   . Hyperlipidemia   . Hypertension   .  No blood products   . Osteopenia   . PTSD (post-traumatic stress disorder)   . Rapid heart beat    takes metoprolol to treat / denies HBP  . Tinnitus    VERY MILD; DOES NOT AFFECT HEARING     PAST SURGICAL HISTORY: Past Surgical History:  Procedure Laterality Date  . ABDOMINAL HYSTERECTOMY    . BUNIONECTOMY     BILATERAL  . CHOLECYSTECTOMY    . JOINT REPLACEMENT    . TOTAL HIP ARTHROPLASTY Left 11/11/2013   Procedure: LEFT TOTAL HIP ARTHROPLASTY ANTERIOR APPROACH;  Surgeon: Loanne Drilling, MD;  Location: [facility];  Service: Orthopedics;  Laterality: Left;  . TOTAL KNEE ARTHROPLASTY Left 08/16/2014   Procedure: LEFT TOTAL KNEE ARTHROPLASTY;  Surgeon: Ollen Gross, MD;  Location: [facility];  Service: Orthopedics;  Laterality: Left;  . TOTAL KNEE ARTHROPLASTY Right 10/22/2016   Procedure: RIGHT TOTAL KNEE ARTHROPLASTY;  Surgeon:  Ollen Gross, MD;  Location: [facility];  Service: Orthopedics;  Laterality: Right;    FAMILY HISTORY: Family History  Problem Relation Age of Onset  . Dementia Mother   . Liver disease Father   . CAD Neg Hx     SOCIAL HISTORY: Social History   Socioeconomic History  . Marital status: Widowed    Spouse name: Not on file  . Number of children: 3  . Years of education: Not on file  . Highest education level: Not on file  Occupational History  . Occupation: Cytogeneticist: [employer]  Social Needs  . Financial resource strain: Not on file  . Food insecurity:    Worry: Not on file    Inability: Not on file  . Transportation needs:    Medical: Not on file    Non-medical: Not on file  Tobacco Use  . Smoking status: Never Smoker  . Smokeless tobacco: Never Used  Substance and Sexual Activity  . Alcohol use: No  . Drug use: No  . Sexual activity: Not Currently  Lifestyle  . Physical activity:    Days per week: Not on file    Minutes per session: Not on file  . Stress: Not on file  Relationships  . Social connections:    Talks on phone: Not on file    Gets together: Not on file    Attends religious service: Not on file    Active member of club or organization: Not on file    Attends meetings of clubs or organizations: Not on file    Relationship status: Not on file  . Intimate partner violence:    Fear of current or ex partner: Not on file    Emotionally abused: Not on file    Physically abused: Not on file    Forced sexual activity: Not on file  Other Topics Concern  . Not on file  Social History Narrative  . Not on file     PHYSICAL EXAM   Vitals:   05/26/18 0915  BP: 124/77  Pulse: 89  Weight: 159 lb (72.1 kg)  Height: 5\' 10"  (1.778 m)    Not recorded      Body mass index is 22.81 kg/m.  PHYSICAL EXAMNIATION:  Gen: NAD, conversant, well nourised, obese, well groomed                     Cardiovascular: Regular rate rhythm, no  peripheral edema, warm, nontender. Eyes: Conjunctivae clear without exudates or hemorrhage Neck: Supple, no carotid bruits. Pulmonary:  Clear to auscultation bilaterally   NEUROLOGICAL EXAM: MMSE - Mini Mental State Exam 01/23/2018  Orientation to time 5  Orientation to Place 5  Registration 3  Attention/ Calculation 5  Recall 3  Language- name 2 objects 2  Language- repeat 1  Language- follow 3 step command 3  Language- read & follow direction 1  Write a sentence 1  Copy design 0  Total score 29  animal naming 10  CRANIAL NERVES: CN II: Visual fields are full to confrontation.  Pupils are round equal and briskly reactive to light. CN III, IV, VI: extraocular movement are normal.  Mild static right ptosis CN V: Facial sensation is intact to pinprick in all 3 divisions bilaterally. Corneal responses are intact.  CN VII: Face is symmetric with normal eye closure and smile. CN VIII: Hearing is normal to rubbing fingers CN IX, X: Palate elevates symmetrically. Phonation is normal. CN XI: Head turning and shoulder shrug are intact CN XII: Tongue is midline with normal movements and no atrophy.  MOTOR: There was no resting tremor, mild rigidity, left worse than right, mild difficulty with finger tapping, wrist opening and closure, left worse than right, loss of rhythm with bilateral foot tapping, there was no muscle weakness  REFLEXES: Reflexes are 2+ and symmetric at the biceps, triceps, knees, and ankles. Plantar responses are flexor.  SENSORY: Intact to light touch, pinprick, positional sensation and vibratory sensation are intact in fingers and toes.  COORDINATION: Rapid alternating movements and fine finger movements are intact. There is no dysmetria on finger-to-nose and heel-knee-shin.    GAIT/STANCE: She needs pushed up to get up from seated position, mildly decreased left arm swing lean forward, good stride, mild difficulty turning, mild retropulsion  stability  DIAGNOSTIC DATA (LABS, IMAGING, TESTING) - I reviewed patient records, labs, notes, testing and imaging myself where available.   ASSESSMENT AND PLAN  Kimberly Mccullough is a 73 y.o. female   Gradual onset loss of sense of smell, suggestion of REM sleep disorder, mild rigidity, bradykinesia on examination,  Consistent with mild parkinsonian features, asymmetric, slow onset, most consistent with idiopathic Parkinson's disease,  MRI of the brain showed mild generalized atrophy, mild supratentorium small vessel disease,  I have encouraged her continue moderate exercise  Referral to physical therapy  Face to face time was 25 minutes, greater than 50% of the time was spent in counseling and coordination of care with the patient.    Levert FeinsteinYijun Jessaca Philippi, M.D. Ph.D.  [facility] [address], [address] [place], [place] [address] Ph: [phone] Fax: [phone]  CC: Kerin Salen'Connor, Lauren [name], [place]

## 2018-05-26 NOTE — Patient Instructions (Signed)
[facility]   Address: [address] [place], [place], [place] [address]  Phone: [phone]

## 2018-06-13 ENCOUNTER — Ambulatory Visit: Payer: Medicare Other | Attending: Neurology | Admitting: Physical Therapy

## 2018-06-13 ENCOUNTER — Encounter: Payer: Self-pay | Admitting: Physical Therapy

## 2018-06-13 ENCOUNTER — Other Ambulatory Visit: Payer: Self-pay

## 2018-06-13 DIAGNOSIS — R2689 Other abnormalities of gait and mobility: Secondary | ICD-10-CM | POA: Diagnosis present

## 2018-06-13 DIAGNOSIS — R262 Difficulty in walking, not elsewhere classified: Secondary | ICD-10-CM | POA: Diagnosis present

## 2018-06-13 NOTE — Therapy (Signed)
[facility] [place] [address] [place], [place], [address] Phone: [phone]   Fax:  [phone]  Physical Therapy Evaluation  Patient Details  Name: Kimberly Mccullough MRN: [id number] Date of Birth: [birth year] Referring Provider (PT): Levert Feinstein   Encounter Date: 06/13/2018  PT End of Session - 06/13/18 0834    Visit Number  1    Date for PT Re-Evaluation  08/12/18    PT Start Time  0800    PT Stop Time  0845    PT Time Calculation (min)  45 min    Activity Tolerance  Patient tolerated treatment well    Behavior During Therapy  [facility] for tasks assessed/performed       Past Medical History:  Diagnosis Date  . Arthritis   . Hyperlipidemia   . Hypertension   . No blood products   . Osteopenia   . PTSD (post-traumatic stress disorder)   . Rapid heart beat    takes metoprolol to treat / denies HBP  . Tinnitus    VERY MILD; DOES NOT AFFECT HEARING     Past Surgical History:  Procedure Laterality Date  . ABDOMINAL HYSTERECTOMY    . BUNIONECTOMY     BILATERAL  . CHOLECYSTECTOMY    . JOINT REPLACEMENT    . TOTAL HIP ARTHROPLASTY Left 11/11/2013   Procedure: LEFT TOTAL HIP ARTHROPLASTY ANTERIOR APPROACH;  Surgeon: Loanne Drilling, MD;  [facility];  Service: Orthopedics;  Laterality: Left;  . TOTAL KNEE ARTHROPLASTY Left 08/16/2014   Procedure: LEFT TOTAL KNEE ARTHROPLASTY;  Surgeon: Ollen Gross, MD;  [facility];  Service: Orthopedics;  Laterality: Left;  . TOTAL KNEE ARTHROPLASTY Right 10/22/2016   Procedure: RIGHT TOTAL KNEE ARTHROPLASTY;  Surgeon: Ollen Gross, MD;  [facility];  Service: Orthopedics;  Laterality: Right;    There were no vitals filed for this visit.   Subjective Assessment - 06/13/18 0805    Subjective  Patient comes in and is familiar to me from past issues.  She comes in today and reports that she was told she has Parkinson's symptoms, she is very upset by this.    Limitations  Walking    Patient Stated Goals  stay active    Currently in Pain?  No/denies         [facility] PT Assessment - 06/13/18 0001      Assessment   Medical Diagnosis  gait abnormality    Referring Provider (PT)  Levert Feinstein    Onset Date/Surgical Date  05/13/18      Precautions   Precautions  None      Balance Screen   Has the patient fallen in the past 6 months  No    Has the patient had a decrease in activity level because of a fear of falling?   No    Is the patient reluctant to leave their home because of a fear of falling?   No      Home Environment   Additional Comments  no stairs      Prior Function   Level of Independence  Independent    Vocation  Retired    Leisure  3x/week at [employer] Comments  fwd head      ROM / Strength   AROM / PROM / Strength  AROM;Strength      AROM   Overall AROM Comments  WFL's, some unsteadiness,  she is very stiff in the neck with any motions, tends not to mover her head, cervical ROM decreased 50%      Strength   Overall Strength Comments  4/5 for the LE's, UE's 4/5      Ambulation/Gait   Gait Comments  No device, slow, unsteady, decreased arm swing      Standardized Balance Assessment   Standardized Balance Assessment  Timed Up and Go Test;Berg Balance Test      Berg Balance Test   Sit to Stand  Able to stand  independently using hands    Standing Unsupported  Able to stand safely 2 minutes    Sitting with Back Unsupported but Feet Supported on Floor or Stool  Able to sit safely and securely 2 minutes    Stand to Sit  Sits safely with minimal use of hands    Transfers  Able to transfer safely, minor use of hands    Standing Unsupported with Eyes Closed  Able to stand 10 seconds with supervision    Standing Ubsupported with Feet Together  Able to place feet together independently and stand 1 minute safely    From Standing, Reach Forward with Outstretched Arm  Can reach forward >12 cm  safely (5")    From Standing Position, Pick up Object from Floor  Able to pick up shoe safely and easily    From Standing Position, Turn to Look Behind Over each Shoulder  Turn sideways only but maintains balance    Turn 360 Degrees  Able to turn 360 degrees safely in 4 seconds or less    Standing Unsupported, Alternately Place Feet on Step/Stool  Able to stand independently and safely and complete 8 steps in 20 seconds    Standing Unsupported, One Foot in Front  Able to plae foot ahead of the other independently and hold 30 seconds    Standing on One Leg  Able to lift leg independently and hold equal to or more than 3 seconds    Total Score  48      Timed Up and Go Test   Normal TUG (seconds)  15                Objective measurements completed on examination: See above findings.      [facility] Adult PT Treatment/Exercise - 06/13/18 0001      Exercises   Exercises  Knee/Hip      Knee/Hip Exercises: Aerobic   Nustep  Level 4 x 6 minutes             PT Education - 06/13/18 0834    Education Details  PWR moves in sitting    Person(s) Educated  Patient    Methods  Explanation;Demonstration;Handout    Comprehension  Verbalized understanding       PT Short Term Goals - 06/13/18 0845      PT SHORT TERM GOAL #1   Title  Independent with initial HEP.     Time  1    Period  Weeks    Status  New        PT Long Term Goals - 06/13/18 0849      PT LONG TERM GOAL #1   Title  Independent with advanced HEP, gym routine.     Time  8    Period  Weeks    Status  New      PT LONG TERM GOAL #2   Title  decrease TUG to 12 seconds  Time  8    Period  Weeks    Status  New      PT LONG TERM GOAL #3   Title  demonstrate normal arm swing with gait without cues    Time  8    Period  Weeks    Status  New      PT LONG TERM GOAL #4   Title  increase Berg Balance score to 51/56    Time  8    Period  Weeks    Status  New      PT LONG TERM GOAL #5   Title  Able to  ambulate up and down stairs step over step.     Time  8    Period  Weeks    Status  New             Plan - 06/13/18 2778    Clinical Impression Statement  Patient comes in with a dx of Parkinson's type symptoms, she is upset about this, she has decreased cervical motions, limited to no arm swing with gait, a stooped posture and smaller steps without picking up feet.  Her TUG was 15 seconds and her Sharlene Motts was 48/56 .  The biggewst issue is the dyskinesia with walking, poor reciprocal motions especially arms    Clinical Presentation  Stable    Clinical Decision Making  Low    Rehab Potential  Good    PT Frequency  1x / week    PT Duration  8 weeks    PT Treatment/Interventions  ADLs/Self Care Home Management;Gait training;Functional mobility training;Therapeutic activities;Therapeutic exercise;Patient/family education;Neuromuscular re-education;Balance training;Manual techniques    PT Next Visit Plan  Work on PWR moves, try to help with reciprocal arm swing during gait    Consulted and Agree with Plan of Care  Patient       Patient will benefit from skilled therapeutic intervention in order to improve the following deficits and impairments:  Abnormal gait, Postural dysfunction, Decreased range of motion, Decreased strength, Impaired flexibility, Difficulty walking, Decreased balance  Visit Diagnosis: Difficulty in walking, not elsewhere classified - Plan: PT plan of care cert/re-cert  Other abnormalities of gait and mobility - Plan: PT plan of care cert/re-cert     Problem List Patient Active Problem List   Diagnosis Date Noted  . Parkinsonism (HCC) 05/26/2018  . Gait abnormality 01/23/2018  . Bradykinesia 01/23/2018  . OA (osteoarthritis) of knee 08/16/2014  . Postoperative anemia due to acute blood loss 11/12/2013  . OA (osteoarthritis) of hip 11/11/2013  . Palpitations 03/11/2012    Jearld Lesch., PT 06/13/2018, 8:54 AM  [facility] [place] [address] [place], [place], [address] Phone: [phone]   Fax:  [phone]  Name: Kimberly Mccullough MRN: [id number] Date of Birth: 21-Jun-1945

## 2018-06-20 ENCOUNTER — Ambulatory Visit: Payer: Medicare Other | Admitting: Physical Therapy

## 2018-06-26 ENCOUNTER — Encounter: Payer: Self-pay | Admitting: Physical Therapy

## 2018-06-26 ENCOUNTER — Ambulatory Visit: Payer: Medicare Other | Admitting: Physical Therapy

## 2018-06-26 DIAGNOSIS — R2689 Other abnormalities of gait and mobility: Secondary | ICD-10-CM

## 2018-06-26 DIAGNOSIS — R262 Difficulty in walking, not elsewhere classified: Secondary | ICD-10-CM

## 2018-06-26 NOTE — Therapy (Signed)
[facility] [place] [address] [place], [place], [address] Phone: [phone]   Fax:  [phone]  Physical Therapy Treatment  Patient Details  Name: Kimberly Mccullough MRN: [id number] Date of Birth: [birth year] Referring Provider (PT): Levert Feinstein   Encounter Date: 06/26/2018  PT End of Session - 06/26/18 0850    Visit Number  2    Date for PT Re-Evaluation  08/12/18    PT Start Time  0800    PT Stop Time  0845    PT Time Calculation (min)  45 min    Activity Tolerance  Patient tolerated treatment well    Behavior During Therapy  [facility] for tasks assessed/performed       Past Medical History:  Diagnosis Date  . Arthritis   . Hyperlipidemia   . Hypertension   . No blood products   . Osteopenia   . PTSD (post-traumatic stress disorder)   . Rapid heart beat    takes metoprolol to treat / denies HBP  . Tinnitus    VERY MILD; DOES NOT AFFECT HEARING     Past Surgical History:  Procedure Laterality Date  . ABDOMINAL HYSTERECTOMY    . BUNIONECTOMY     BILATERAL  . CHOLECYSTECTOMY    . JOINT REPLACEMENT    . TOTAL HIP ARTHROPLASTY Left 11/11/2013   Procedure: LEFT TOTAL HIP ARTHROPLASTY ANTERIOR APPROACH;  Surgeon: Loanne Drilling, MD;  [facility];  Service: Orthopedics;  Laterality: Left;  . TOTAL KNEE ARTHROPLASTY Left 08/16/2014   Procedure: LEFT TOTAL KNEE ARTHROPLASTY;  Surgeon: Ollen Gross, MD;  [facility];  Service: Orthopedics;  Laterality: Left;  . TOTAL KNEE ARTHROPLASTY Right 10/22/2016   Procedure: RIGHT TOTAL KNEE ARTHROPLASTY;  Surgeon: Ollen Gross, MD;  [facility];  Service: Orthopedics;  Laterality: Right;    There were no vitals filed for this visit.  Subjective Assessment - 06/26/18 0814    Subjective  Patient reports that she went to the gym and tried the NuStep, had some questions about it    Currently in Pain?  No/denies                       [facility] Adult PT  Treatment/Exercise - 06/26/18 0001      Ambulation/Gait   Gait Comments  use of two canes in her hands and PT behind her assisting with getting arm swing with steps, a lot of cues needed      High Level Balance   High Level Balance Comments  ball toss and ball kicks trying to get her to move heads and feet      Knee/Hip Exercises: Aerobic   Recumbent Bike  x 6 minutes level 0    Nustep  Level 4 x 6 minutes    Other Aerobic  resisted gait all directions        PWR [facility]) - 06/26/18 0849    PWR! Step Through Forward/Back  sitting x 20    PWR! Rock and Step Turn  sitting x20    Reaching Comments  sitting x 20          PT Education - 06/26/18 [phone]    Education Details  went over these again as she did not understand them    Person(s) Educated  Patient    Methods  Explanation;Demonstration;Tactile cues;Verbal cues    Comprehension  Verbalized understanding;Returned demonstration;Verbal cues required       PT  Short Term Goals - 06/26/18 7106      PT SHORT TERM GOAL #1   Title  Independent with initial HEP.     Status  On-going        PT Long Term Goals - 06/13/18 0849      PT LONG TERM GOAL #1   Title  Independent with advanced HEP, gym routine.     Time  8    Period  Weeks    Status  New      PT LONG TERM GOAL #2   Title  decrease TUG to 12 seconds    Time  8    Period  Weeks    Status  New      PT LONG TERM GOAL #3   Title  demonstrate normal arm swing with gait without cues    Time  8    Period  Weeks    Status  New      PT LONG TERM GOAL #4   Title  increase Berg Balance score to 51/56    Time  8    Period  Weeks    Status  New      PT LONG TERM GOAL #5   Title  Able to ambulate up and down stairs step over step.     Time  8    Period  Weeks    Status  New            Plan - 06/26/18 0850    Clinical Impression Statement  Patient tells me that she is "devestated" by the diagnosis, reports that she wants a second opinion, we spoke about  the movement patterns that she exhibits that need to improve no matter the diagnosis, needing head motions, trunk motions and arm swing with gait.  Need close supervision with the soccer kicks as she gets her feet tangled at times    PT Next Visit Plan  Work on [employer] moves, try to help with reciprocal arm swing during gait    Consulted and Agree with Plan of Care  Patient       Patient will benefit from skilled therapeutic intervention in order to improve the following deficits and impairments:  Abnormal gait, Postural dysfunction, Decreased range of motion, Decreased strength, Impaired flexibility, Difficulty walking, Decreased balance  Visit Diagnosis: Difficulty in walking, not elsewhere classified  Other abnormalities of gait and mobility     Problem List Patient Active Problem List   Diagnosis Date Noted  . Parkinsonism (HCC) 05/26/2018  . Gait abnormality 01/23/2018  . Bradykinesia 01/23/2018  . OA (osteoarthritis) of knee 08/16/2014  . Postoperative anemia due to acute blood loss 11/12/2013  . OA (osteoarthritis) of hip 11/11/2013  . Palpitations 03/11/2012    Jearld Lesch., PT 06/26/2018, 8:53 AM  [facility] [place] [address] [place], [place], [address] Phone: [phone]   Fax:  [phone]  Name: Kimberly Mccullough MRN: [id number] Date of Birth: 10-Jun-1945

## 2018-06-27 ENCOUNTER — Encounter: Payer: Medicare Other | Admitting: Physical Therapy

## 2018-07-07 ENCOUNTER — Encounter: Payer: Self-pay | Admitting: Physical Therapy

## 2018-07-07 ENCOUNTER — Ambulatory Visit: Payer: Medicare Other | Attending: Neurology | Admitting: Physical Therapy

## 2018-07-07 DIAGNOSIS — R262 Difficulty in walking, not elsewhere classified: Secondary | ICD-10-CM | POA: Diagnosis present

## 2018-07-07 DIAGNOSIS — R2689 Other abnormalities of gait and mobility: Secondary | ICD-10-CM | POA: Diagnosis present

## 2018-07-07 DIAGNOSIS — M25661 Stiffness of right knee, not elsewhere classified: Secondary | ICD-10-CM | POA: Insufficient documentation

## 2018-07-07 NOTE — Therapy (Signed)
[facility] [address] [place], [place], [address] Phone: [phone]   Fax:  [phone]  Physical Therapy Treatment  Patient Details  Name: Kimberly Mccullough MRN: [id number] Date of Birth: 03/03/1946 Referring Provider (PT): Levert Feinstein   Encounter Date: 07/07/2018  PT End of Session - 07/07/18 0853    Visit Number  3    Date for PT Re-Evaluation  08/12/18    PT Start Time  0800    PT Stop Time  0850    PT Time Calculation (min)  50 min    Activity Tolerance  Patient tolerated treatment well    Behavior During Therapy  [facility] for tasks assessed/performed       Past Medical History:  Diagnosis Date  . Arthritis   . Hyperlipidemia   . Hypertension   . No blood products   . Osteopenia   . PTSD (post-traumatic stress disorder)   . Rapid heart beat    takes metoprolol to treat / denies HBP  . Tinnitus    VERY MILD; DOES NOT AFFECT HEARING     Past Surgical History:  Procedure Laterality Date  . ABDOMINAL HYSTERECTOMY    . BUNIONECTOMY     BILATERAL  . CHOLECYSTECTOMY    . JOINT REPLACEMENT    . TOTAL HIP ARTHROPLASTY Left 11/11/2013   Procedure: LEFT TOTAL HIP ARTHROPLASTY ANTERIOR APPROACH;  Surgeon: Loanne Drilling, MD;  Location: [facility];  Service: Orthopedics;  Laterality: Left;  . TOTAL KNEE ARTHROPLASTY Left 08/16/2014   Procedure: LEFT TOTAL KNEE ARTHROPLASTY;  Surgeon: Ollen Gross, MD;  Location: [facility];  Service: Orthopedics;  Laterality: Left;  . TOTAL KNEE ARTHROPLASTY Right 10/22/2016   Procedure: RIGHT TOTAL KNEE ARTHROPLASTY;  Surgeon: Ollen Gross, MD;  [facility];  Service: Orthopedics;  Laterality: Right;    There were no vitals filed for this visit.  Subjective Assessment - 07/07/18 0806    Subjective  Patient reports no changes    Currently in Pain?  No/denies                       [facility] Adult PT Treatment/Exercise - 07/07/18 0001      Ambulation/Gait   Gait  Comments  around the building outside, cues for arm swing, some cues for step length going up hill as she started to scuff heels      High Level Balance   High Level Balance Comments  ball toss and ball kicks trying to get her to move heads and feet, walking with direction changes, direction changes with ball tossing      Knee/Hip Exercises: Aerobic   Recumbent Bike  x 6 minutes level 0    Nustep  Level 5 x 6 minutes    Other Aerobic  resisted gait all directions        [facility]) - 07/07/18 1941    PWR! Step Through Forward/Back  sitting x 20    PWR! Rock and Step Turn  sitting x 20    Reaching Comments  sitting x 20            PT Short Term Goals - 07/07/18 0913      PT SHORT TERM GOAL #1   Title  Independent with initial HEP.     Status  Achieved        PT Long Term Goals - 07/07/18 0913      PT LONG TERM GOAL #  1   Title  Independent with advanced HEP, gym routine.     Status  On-going      PT LONG TERM GOAL #2   Title  decrease TUG to 12 seconds    Status  On-going            Plan - 07/07/18 0853    Clinical Impression Statement  Patient did well with the soccer kicks and with the walking ball toss, she was unstable with some things but able to correct on her own.  I reiterated the need fot her to go to gym on her own and to do the PWR moves that were given in the first day, she reports that she does not feel that they are hard I told her they are to keep her moving and moving in different ways, they do not need to be difficult    PT Next Visit Plan  work on the movement patterns and balance    Consulted and Agree with Plan of Care  Patient       Patient will benefit from skilled therapeutic intervention in order to improve the following deficits and impairments:  Abnormal gait, Postural dysfunction, Decreased range of motion, Decreased strength, Impaired flexibility, Difficulty walking, Decreased balance  Visit Diagnosis: Difficulty in walking, not  elsewhere classified  Other abnormalities of gait and mobility  Stiffness of right knee, not elsewhere classified     Problem List Patient Active Problem List   Diagnosis Date Noted  . Parkinsonism ([facility]) 05/26/2018  . Gait abnormality 01/23/2018  . Bradykinesia 01/23/2018  . OA (osteoarthritis) of knee 08/16/2014  . Postoperative anemia due to acute blood loss 11/12/2013  . OA (osteoarthritis) of hip 11/11/2013  . Palpitations 03/11/2012    Jearld Lesch., PT 07/07/2018, 9:14 AM  [facility] [address] [place], [place], [address] Phone: [phone]   Fax:  [phone]  Name: Kimberly Mccullough MRN: [id number] Date of Birth: [birth year]

## 2018-07-14 ENCOUNTER — Ambulatory Visit: Payer: Medicare Other | Admitting: Physical Therapy

## 2018-07-21 ENCOUNTER — Encounter: Payer: Medicare Other | Admitting: Physical Therapy

## 2018-09-01 ENCOUNTER — Other Ambulatory Visit: Payer: Self-pay

## 2018-09-01 ENCOUNTER — Ambulatory Visit: Payer: Medicare Other | Attending: Neurology | Admitting: Physical Therapy

## 2018-09-01 ENCOUNTER — Encounter: Payer: Self-pay | Admitting: Physical Therapy

## 2018-09-01 DIAGNOSIS — R262 Difficulty in walking, not elsewhere classified: Secondary | ICD-10-CM | POA: Insufficient documentation

## 2018-09-01 DIAGNOSIS — R2689 Other abnormalities of gait and mobility: Secondary | ICD-10-CM | POA: Diagnosis present

## 2018-09-01 NOTE — Therapy (Signed)
[place], [place], [address] Phone: [phone]   Fax:  [phone]  Physical Therapy Treatment  Patient Details  Name: Kimberly Mccullough MRN: [id number] Date of Birth: [birth year] Referring Provider (PT): Marcial Pacas   Encounter Date: 09/01/2018  PT End of Session - 09/01/18 1050    Visit Number  4    Date for PT Re-Evaluation  10/02/18    PT Start Time  0929    PT Stop Time  1010    PT Time Calculation (min)  41 min    Activity Tolerance  Patient tolerated treatment well    Behavior During Therapy  [facility] for tasks assessed/performed       Past Medical History:  Diagnosis Date  . Arthritis   . Hyperlipidemia   . Hypertension   . No blood products   . Osteopenia   . PTSD (post-traumatic stress disorder)   . Rapid heart beat    takes metoprolol to treat / denies HBP  . Tinnitus    VERY MILD; DOES NOT AFFECT HEARING     Past Surgical History:  Procedure Laterality Date  . ABDOMINAL HYSTERECTOMY    . BUNIONECTOMY     BILATERAL  . CHOLECYSTECTOMY    . JOINT REPLACEMENT    . TOTAL HIP ARTHROPLASTY Left 11/11/2013   Procedure: LEFT TOTAL HIP ARTHROPLASTY ANTERIOR APPROACH;  Surgeon: Gearlean Alf, MD;  [facility];  Service: Orthopedics;  Laterality: Left;  . TOTAL KNEE ARTHROPLASTY Left 08/16/2014   Procedure: LEFT TOTAL KNEE ARTHROPLASTY;  Surgeon: Gaynelle Arabian, MD;  [facility];  Service: Orthopedics;  Laterality: Left;  . TOTAL KNEE ARTHROPLASTY Right 10/22/2016   Procedure: RIGHT TOTAL KNEE ARTHROPLASTY;  Surgeon: Gaynelle Arabian, MD;  [facility];  Service: Orthopedics;  Laterality: Right;    There were no vitals filed for this visit.  Subjective Assessment - 09/01/18 0936    Subjective  Patient has not been seen since 07/07/2018 due to Covid-19.  We stopped treatment, she reports that she has been unable to go to the gym due to their closure, she reports that she has  lacked motivation to do exercises at home and feels like she needs guidance.  She denies falls.    Currently in Pain?  No/denies         [facility] PT Assessment - 09/01/18 0001      Assessment   Medical Diagnosis  gait abnormality    Referring Provider (PT)  Marcial Pacas      Timed Up and Go Test   Normal TUG (seconds)  14                   [facility] Adult PT Treatment/Exercise - 09/01/18 0001      Ambulation/Gait   Gait Comments  outside around the parking island, at times needs cues for arm swing and to pick up feet, when she thinks about it she can do well, but iwth fatigue she will revert to no arm swing, stooped posture and the shuffling steps      High Level Balance   High Level Balance Comments  ball toss and ball kicks trying to get her to move heads and feet, walking with direction changes, direction changes with ball tossing      Knee/Hip Exercises: Aerobic   Elliptical  1.5 minutes R=4, I = 10, very fatigues, c/o shaky legs    Recumbent Bike  x 5  minutes level 0    Nustep  Level 5 x 6 minutes      Knee/Hip Exercises: Machines for Strengthening   Cybex Knee Extension  10# 2x10    Cybex Knee Flexion  25# 2x10    Other Machine  seated row 15#, lats 20# 2x10      Knee/Hip Exercises: Standing   Other Standing Knee Exercises  8" alternating toe touches               PT Short Term Goals - 07/07/18 0913      PT SHORT TERM GOAL #1   Title  Independent with initial HEP.     Status  Achieved        PT Long Term Goals - 09/01/18 1053      PT LONG TERM GOAL #1   Title  Independent with advanced HEP, gym routine.     Status  Partially Met      PT LONG TERM GOAL #2   Title  decrease TUG to 12 seconds    Status  Partially Met      PT LONG TERM GOAL #3   Title  demonstrate normal arm swing with gait without cues    Status  On-going      PT LONG TERM GOAL #4   Title  increase Berg Balance score to 51/56    Status  On-going      PT LONG TERM GOAL #5    Title  Able to ambulate up and down stairs step over step.     Status  On-going            Plan - 09/01/18 1051    Clinical Impression Statement  We stopped seeing the patient on 07/07/18 due to Covid-19.  She has not been able to go to the gym due to their closure, she reports that she has had difficulty with self motivation and doing exercises at home.  She was able to resume exercises but had much more fatigue and needed a few rest breaks.  She had some loss of balance with the higher level balance activities    PT Next Visit Plan  we will resume treatment 1x/week and work on her mobility and safety    Consulted and Agree with Plan of Care  Patient       Patient will benefit from skilled therapeutic intervention in order to improve the following deficits and impairments:  Abnormal gait, Postural dysfunction, Decreased range of motion, Decreased strength, Impaired flexibility, Difficulty walking, Decreased balance  Visit Diagnosis: Difficulty in walking, not elsewhere classified - Plan: PT plan of care cert/re-cert  Other abnormalities of gait and mobility - Plan: PT plan of care cert/re-cert     Problem List Patient Active Problem List   Diagnosis Date Noted  . Parkinsonism ([place]) 05/26/2018  . Gait abnormality 01/23/2018  . Bradykinesia 01/23/2018  . OA (osteoarthritis) of knee 08/16/2014  . Postoperative anemia due to acute blood loss 11/12/2013  . OA (osteoarthritis) of hip 11/11/2013  . Palpitations 03/11/2012    Sumner Boast., PT 09/01/2018, 10:59 AM  [address] [address] [place], [place], [address] Phone: [phone]   Fax:  [phone]  Name: Kimberly Mccullough MRN: [id number] Date of Birth: [birth year]

## 2018-09-15 ENCOUNTER — Encounter: Payer: Self-pay | Admitting: Physical Therapy

## 2018-09-15 ENCOUNTER — Other Ambulatory Visit: Payer: Self-pay

## 2018-09-15 ENCOUNTER — Ambulatory Visit: Payer: Medicare Other | Admitting: Physical Therapy

## 2018-09-15 DIAGNOSIS — R262 Difficulty in walking, not elsewhere classified: Secondary | ICD-10-CM

## 2018-09-15 DIAGNOSIS — R2689 Other abnormalities of gait and mobility: Secondary | ICD-10-CM

## 2018-09-15 NOTE — Therapy (Signed)
[place], [place], [address] Phone: [phone]   Fax:  [phone]  Physical Therapy Treatment  Patient Details  Name: Kimberly Mccullough MRN: [id number] Date of Birth: 01-Dec-1945 Referring Provider (PT): Marcial Pacas   Encounter Date: 09/15/2018  PT End of Session - 09/15/18 1048    Visit Number  5    Date for PT Re-Evaluation  10/02/18    PT Start Time  0929    PT Stop Time  1011    PT Time Calculation (min)  42 min    Activity Tolerance  Patient tolerated treatment well    Behavior During Therapy  [facility] for tasks assessed/performed       Past Medical History:  Diagnosis Date  . Arthritis   . Hyperlipidemia   . Hypertension   . No blood products   . Osteopenia   . PTSD (post-traumatic stress disorder)   . Rapid heart beat    takes metoprolol to treat / denies HBP  . Tinnitus    VERY MILD; DOES NOT AFFECT HEARING     Past Surgical History:  Procedure Laterality Date  . ABDOMINAL HYSTERECTOMY    . BUNIONECTOMY     BILATERAL  . CHOLECYSTECTOMY    . JOINT REPLACEMENT    . TOTAL HIP ARTHROPLASTY Left 11/11/2013   Procedure: LEFT TOTAL HIP ARTHROPLASTY ANTERIOR APPROACH;  Surgeon: Gearlean Alf, MD;  [facility];  Service: Orthopedics;  Laterality: Left;  . TOTAL KNEE ARTHROPLASTY Left 08/16/2014   Procedure: LEFT TOTAL KNEE ARTHROPLASTY;  Surgeon: Gaynelle Arabian, MD;  [facility];  Service: Orthopedics;  Laterality: Left;  . TOTAL KNEE ARTHROPLASTY Right 10/22/2016   Procedure: RIGHT TOTAL KNEE ARTHROPLASTY;  Surgeon: Gaynelle Arabian, MD;  [facility];  Service: Orthopedics;  Laterality: Right;    There were no vitals filed for this visit.  Subjective Assessment - 09/15/18 0934    Subjective  Patient reports tha tshe is still frustrated with her diagnosis and her lack of doing exercises    Currently in Pain?  No/denies                       [facility]  Adult PT Treatment/Exercise - 09/15/18 0001      High Level Balance   High Level Balance Comments  ball toss and ball kicks trying to get her to move heads and feet, walking with direction changes, direction changes with ball tossing, PWR moves walking in the hall, big steps and reaching up, twisting, head turns, side step overs      Knee/Hip Exercises: Aerobic   Nustep  Level 5 x 6 minutes             PT Education - 09/15/18 1048    Education Details  Gave info about eh [facility] website and showed her how to look at parkinson's exercises    Person(s) Educated  Patient    Methods  Explanation;Demonstration    Comprehension  Verbalized understanding       PT Short Term Goals - 07/07/18 0913      PT SHORT TERM GOAL #1   Title  Independent with initial HEP.     Status  Achieved        PT Long Term Goals - 09/15/18 1050      PT LONG TERM GOAL #1   Title  Independent with advanced HEP, gym routine.     Status  Partially Met      PT LONG TERM GOAL #2   Title  decrease TUG to 12 seconds    Status  Achieved      PT LONG TERM GOAL #3   Title  demonstrate normal arm swing with gait without cues    Status  Partially Met            Plan - 09/15/18 1049    Clinical Impression Statement  Patient continues with many questions about Parkinson's, she is frustrated and has denied the diagnosis and reports seeking a second opinion, I spoke with her today about the exercises being very important as not just for Parkinson's but for aging.  She did well with the exercises today and tried to make it higher level for balance and multitasking    PT Next Visit Plan  see if she looked at the website and tried any of the exercises    Consulted and Agree with Plan of Care  Patient       Patient will benefit from skilled therapeutic intervention in order to improve the following deficits and impairments:  Abnormal gait, Postural dysfunction, Decreased range of motion, Decreased  strength, Impaired flexibility, Difficulty walking, Decreased balance  Visit Diagnosis: Difficulty in walking, not elsewhere classified  Other abnormalities of gait and mobility     Problem List Patient Active Problem List   Diagnosis Date Noted  . Parkinsonism ([place]) 05/26/2018  . Gait abnormality 01/23/2018  . Bradykinesia 01/23/2018  . OA (osteoarthritis) of knee 08/16/2014  . Postoperative anemia due to acute blood loss 11/12/2013  . OA (osteoarthritis) of hip 11/11/2013  . Palpitations 03/11/2012    Sumner Boast., PT 09/15/2018, 10:51 AM  [place] [address] [place], [place], [address] Phone: [phone]   Fax:  [phone]  Name: Kimberly Mccullough MRN: [id number] Date of Birth: 07/04/1945

## 2018-09-29 ENCOUNTER — Other Ambulatory Visit: Payer: Self-pay

## 2018-09-29 ENCOUNTER — Ambulatory Visit: Payer: Medicare Other | Attending: Neurology | Admitting: Physical Therapy

## 2018-09-29 ENCOUNTER — Encounter: Payer: Self-pay | Admitting: Physical Therapy

## 2018-09-29 DIAGNOSIS — R2689 Other abnormalities of gait and mobility: Secondary | ICD-10-CM | POA: Diagnosis present

## 2018-09-29 DIAGNOSIS — R262 Difficulty in walking, not elsewhere classified: Secondary | ICD-10-CM

## 2018-09-29 NOTE — Therapy (Signed)
[place], [place], [address] Phone: [phone]   Fax:  [phone]  Physical Therapy Treatment  Patient Details  Name: Kimberly Mccullough MRN: [id number] Date of Birth: January 02, 1946 Referring Provider (PT): Marcial Pacas   Encounter Date: 09/29/2018  PT End of Session - 09/29/18 1150    Visit Number  6    PT Start Time  0927    PT Stop Time  1010    PT Time Calculation (min)  43 min    Activity Tolerance  Patient tolerated treatment well    Behavior During Therapy  [facility] for tasks assessed/performed       Past Medical History:  Diagnosis Date  . Arthritis   . Hyperlipidemia   . Hypertension   . No blood products   . Osteopenia   . PTSD (post-traumatic stress disorder)   . Rapid heart beat    takes metoprolol to treat / denies HBP  . Tinnitus    VERY MILD; DOES NOT AFFECT HEARING     Past Surgical History:  Procedure Laterality Date  . ABDOMINAL HYSTERECTOMY    . BUNIONECTOMY     BILATERAL  . CHOLECYSTECTOMY    . JOINT REPLACEMENT    . TOTAL HIP ARTHROPLASTY Left 11/11/2013   Procedure: LEFT TOTAL HIP ARTHROPLASTY ANTERIOR APPROACH;  Surgeon: Gearlean Alf, MD;  [facility];  Service: Orthopedics;  Laterality: Left;  . TOTAL KNEE ARTHROPLASTY Left 08/16/2014   Procedure: LEFT TOTAL KNEE ARTHROPLASTY;  Surgeon: Gaynelle Arabian, MD;  [facility];  Service: Orthopedics;  Laterality: Left;  . TOTAL KNEE ARTHROPLASTY Right 10/22/2016   Procedure: RIGHT TOTAL KNEE ARTHROPLASTY;  Surgeon: Gaynelle Arabian, MD;  [facility];  Service: Orthopedics;  Laterality: Right;    There were no vitals filed for this visit.  Subjective Assessment - 09/29/18 0928    Subjective  Patient reports that she looked at the website I gave her.  She has questions about it and th exercises.    Currently in Pain?  No/denies         [facility] PT Assessment - 09/29/18 0001      Berg Balance Test   Sit to  Stand  Able to stand without using hands and stabilize independently    Standing Unsupported  Able to stand safely 2 minutes    Sitting with Back Unsupported but Feet Supported on Floor or Stool  Able to sit safely and securely 2 minutes    Stand to Sit  Sits safely with minimal use of hands    Transfers  Able to transfer safely, minor use of hands    Standing Unsupported with Eyes Closed  Able to stand 10 seconds with supervision    Standing Unsupported with Feet Together  Able to place feet together independently and stand 1 minute safely    From Standing, Reach Forward with Outstretched Arm  Can reach confidently >25 cm (10")    From Standing Position, Pick up Object from Floor  Able to pick up shoe safely and easily    From Standing Position, Turn to Look Behind Over each Shoulder  Looks behind one side only/other side shows less weight shift    Turn 360 Degrees  Able to turn 360 degrees safely in 4 seconds or less    Standing Unsupported, Alternately Place Feet on Step/Stool  Able to stand independently and safely and complete 8 steps in 20 seconds  Standing Unsupported, One Foot in Front  Able to plae foot ahead of the other independently and hold 30 seconds    Standing on One Leg  Able to lift leg independently and hold equal to or more than 3 seconds    Total Score  51                   [facility] Adult PT Treatment/Exercise - 09/29/18 0001      High Level Balance   High Level Balance Comments  ball toss and ball kicks trying to get her to move hands and feet, walking with direction changes, direction changes with ball tossing, PWR moves walking in the hall, big steps and reaching up, twisting, head turns, side step overs side to side and front to back, 8" step ups with opposite arm reaching      Knee/Hip Exercises: Aerobic   Elliptical  2 minutes R=4, I=10    Nustep  Level 5 x 6 minutes      Knee/Hip Exercises: Machines for Strengthening   Cybex Knee Extension  10# 2x10     Cybex Knee Flexion  25# 2x10    Other Machine  seated row 15#, lats 20# 2x10               PT Short Term Goals - 07/07/18 0913      PT SHORT TERM GOAL #1   Title  Independent with initial HEP.     Status  Achieved        PT Long Term Goals - 09/29/18 1152      PT LONG TERM GOAL #1   Title  Independent with advanced HEP, gym routine.     Status  Achieved      PT LONG TERM GOAL #2   Title  decrease TUG to 12 seconds    Status  Achieved      PT LONG TERM GOAL #3   Title  demonstrate normal arm swing with gait without cues    Status  Achieved      PT LONG TERM GOAL #4   Title  increase Berg Balance score to 51/56    Status  Achieved      PT LONG TERM GOAL #5   Title  Able to ambulate up and down stairs step over step.     Status  Achieved            Plan - 09/29/18 1151    Clinical Impression Statement  Patient reports that she feels good about where she is and what she is going to be doing when the gym opens up.  She has had multiple questions and we have talked at length about the diagnosis, the importance of exercises and movements.    PT Next Visit Plan  will d/c with goals met    Consulted and Agree with Plan of Care  Patient       Patient will benefit from skilled therapeutic intervention in order to improve the following deficits and impairments:  Abnormal gait, Postural dysfunction, Decreased range of motion, Decreased strength, Impaired flexibility, Difficulty walking, Decreased balance  Visit Diagnosis: Difficulty in walking, not elsewhere classified  Other abnormalities of gait and mobility     Problem List Patient Active Problem List   Diagnosis Date Noted  . Parkinsonism ([place]) 05/26/2018  . Gait abnormality 01/23/2018  . Bradykinesia 01/23/2018  . OA (osteoarthritis) of knee 08/16/2014  . Postoperative anemia due to acute blood loss 11/12/2013  .  OA (osteoarthritis) of hip 11/11/2013  . Palpitations 03/11/2012     Sumner Boast., PT 09/29/2018, 11:53 AM  [place] [address] [place], [place], [address] Phone: [phone]   Fax:  [phone]  Name: Kimberly Mccullough MRN: [id number] Date of Birth: December 05, 1945

## 2018-12-01 ENCOUNTER — Other Ambulatory Visit: Payer: Self-pay

## 2018-12-01 ENCOUNTER — Telehealth: Payer: Self-pay | Admitting: Neurology

## 2018-12-01 ENCOUNTER — Other Ambulatory Visit: Payer: Self-pay | Admitting: Neurology

## 2018-12-01 ENCOUNTER — Ambulatory Visit (INDEPENDENT_AMBULATORY_CARE_PROVIDER_SITE_OTHER): Payer: Medicare Other | Admitting: Neurology

## 2018-12-01 ENCOUNTER — Encounter: Payer: Self-pay | Admitting: Neurology

## 2018-12-01 VITALS — BP 147/92 | HR 93 | Temp 97.8°F | Ht 70.0 in | Wt 163.0 lb

## 2018-12-01 DIAGNOSIS — G20 Parkinson's disease: Secondary | ICD-10-CM

## 2018-12-01 MED ORDER — ROPINIROLE HCL 0.25 MG PO TABS: 0.2500 mg | ORAL_TABLET | Freq: Three times a day (TID) | ORAL | 11 refills | Status: DC

## 2018-12-01 MED ORDER — RASAGILINE MESYLATE 1 MG PO TABS: 1.0000 mg | ORAL_TABLET | Freq: Every day | ORAL | 11 refills | Status: DC

## 2018-12-01 NOTE — Telephone Encounter (Signed)
I have called in requip 0.25mg  tid, possible side effect listed below, it might help her tremor and slow movement  Common  Cardiovascular: Hypertension (3% to 5% ), Orthostatic hypotension (Up to 38% )  Gastrointestinal: Abdominal pain (6% to 7% ), Constipation (4% to 5% ), Nausea (11% to 60% ), Vomiting (7% to 12% )  Neurologic: Dizziness (6% to 40% ), Dyskinesia (7% to 34% ), Headache (6% to 8% ), Somnolence (7% to 40%; sudden onset of sleep, up to 5% )  Other: Fatigue (8% to 11% ) Serious  Cardiovascular: Sinus node dysfunction, Syncope (1% to 12% )  Psychiatric: Disturbance in thinking, Hallucinations (3% to 10% ), Unusual change in behavior

## 2018-12-01 NOTE — Telephone Encounter (Signed)
Pt states that the medication rasagiline (AZILECT) 1 MG TABS tablet is to expensive and is wanting to know if there is something else she can take that is more affordable. Please advise.

## 2018-12-01 NOTE — Progress Notes (Signed)
Kimberly Mccullough: Kimberly Mccullough DOB: [birth year]  Chief Complaint  Patient presents with  . Follow-up    pt alone, rm 4, pt following up, no issues or concerns.     HISTORICAL  Kimberly Mccullough is a 73 year old female, seen in request by her primary care PA Kerin Salen'Connor, Lauren [name], for evaluation of decreased sense of smell, concerning about Parkinson's disease, initial evaluation was on January 23, 2018.  I have reviewed and summarized the referring note from the referring physician.  She had past medical history of hypertension, status post bilateral knee replacement, left hip replacement.  She lives alone since her husband passed away in 2012  She noticed gradual onset loss of sense of smell for the past couple years, was noted by her son that she has mild difficulty clear her foot from the floor sometimes, she denies significant tremor, mild unsteady gait she contributed to her joints replacement.  She also was noted to be acting out of dreams, she exercise regularly, she is Jehovah's Witness,  visiting people's house regularly  Laboratory evaluation in September 2019: Normal CMP, creatinine of 0.88, total cholesterol was 223, TSH 2.9  UPDATE May 26 2018: She has intermittent body twitching, she does exercise regularly, 3 times a week, continue has intermittent head shaking,          I personally reviewed MRI brain in October 2019, mild generalized atrophy, supratentorium small vessel disease, there was no acute abnormality.                                                   UPDATE December 01 2018: She noticed slow worsening gait abnormality, left hand shaking, she was not able to exercise regularly due to COVID-19   REVIEW OF SYSTEMS: Full 14 system review of systems performed and notable only for as above All other review of systems were negative.  ALLERGIES: Allergies  Allergen Reactions  . Statins Other (See Comments)    myalgia  . Other     Pt is Jehovas Witness and  does not want any blood products Pt is Jehovas Witness and does not want any blood products    HOME MEDICATIONS: Current Outpatient Medications  Medication Sig Dispense Refill  . alendronate (FOSAMAX) 70 MG tablet Take by mouth.    Marland Kitchen. aspirin 81 MG tablet Take 81 mg by mouth daily.    Marland Kitchen. BIOTIN FORTE PO Take by mouth.    . FIBER ADULT GUMMIES PO Take 2 tablets by mouth daily.    . Glucosamine-Chondroit-Vit C-Mn (GLUCOSAMINE 1500 COMPLEX) CAPS Take by mouth.    . meloxicam (MOBIC) 15 MG tablet Take 15 mg by mouth daily.    . Multiple Vitamins-Minerals (MULTIVITAMIN ADULTS PO) Take by mouth.    . Omega-3 Fatty Acids (FISH OIL) 1000 MG CAPS Take by mouth.    Marland Kitchen. omeprazole (PRILOSEC) 40 MG capsule     . Probiotic Product (PROBIOTIC PO) Take 1 tablet by mouth 2 (two) times a day.    . verapamil (CALAN-SR) 120 MG CR tablet Take 1 tablet (120 mg total) by mouth 2 (two) times daily. 180 tablet 3  . vitamin E (VITAMIN E) 400 UNIT capsule Take 400 Units by mouth daily.     No current facility-administered medications for this visit.     PAST MEDICAL HISTORY: Past Medical History:  Diagnosis Date  . Arthritis   . Hyperlipidemia   . Hypertension   . No blood products   . Osteopenia   . PTSD (post-traumatic stress disorder)   . Rapid heart beat    takes metoprolol to treat / denies HBP  . Tinnitus    VERY MILD; DOES NOT AFFECT HEARING     PAST SURGICAL HISTORY: Past Surgical History:  Procedure Laterality Date  . ABDOMINAL HYSTERECTOMY    . BUNIONECTOMY     BILATERAL  . CHOLECYSTECTOMY    . JOINT REPLACEMENT    . TOTAL HIP ARTHROPLASTY Left 11/11/2013   Procedure: LEFT TOTAL HIP ARTHROPLASTY ANTERIOR APPROACH;  Surgeon: Loanne DrillingFrank Aluisio V, MD;  Location: [facility];  Service: Orthopedics;  Laterality: Left;  . TOTAL KNEE ARTHROPLASTY Left 08/16/2014   Procedure: LEFT TOTAL KNEE ARTHROPLASTY;  Surgeon: Ollen GrossFrank Aluisio, MD;  Location: [facility];  Service: Orthopedics;  Laterality: Left;  . TOTAL  KNEE ARTHROPLASTY Right 10/22/2016   Procedure: RIGHT TOTAL KNEE ARTHROPLASTY;  Surgeon: Ollen GrossAluisio, Frank, MD;  Location: [facility];  Service: Orthopedics;  Laterality: Right;    FAMILY HISTORY: Family History  Problem Relation Age of Onset  . Dementia Mother   . Liver disease Father   . CAD Neg Hx     SOCIAL HISTORY: Social History   Socioeconomic History  . Marital status: Widowed    Spouse name: Not on file  . Number of children: 3  . Years of education: Not on file  . Highest education level: Not on file  Occupational History  . Occupation: CytogeneticistLegal assistant    Employer: [employer]  Social Needs  . Financial resource strain: Not on file  . Food insecurity    Worry: Not on file    Inability: Not on file  . Transportation needs    Medical: Not on file    Non-medical: Not on file  Tobacco Use  . Smoking status: Never Smoker  . Smokeless tobacco: Never Used  Substance and Sexual Activity  . Alcohol use: No  . Drug use: No  . Sexual activity: Not Currently  Lifestyle  . Physical activity    Days per week: Not on file    Minutes per session: Not on file  . Stress: Not on file  Relationships  . Social Musicianconnections    Talks on phone: Not on file    Gets together: Not on file    Attends religious service: Not on file    Active member of club or organization: Not on file    Attends meetings of clubs or organizations: Not on file    Relationship status: Not on file  . Intimate partner violence    Fear of current or ex partner: Not on file    Emotionally abused: Not on file    Physically abused: Not on file    Forced sexual activity: Not on file  Other Topics Concern  . Not on file  Social History Narrative  . Not on file     PHYSICAL EXAM   Vitals:   12/01/18 0925  BP: (!) 147/92  Pulse: 93  Temp: 97.8 F (36.6 C)  Weight: 163 lb (73.9 kg)  Height: 5\' 10"  (1.778 m)    Not recorded      Body mass index is 23.39 kg/m.  PHYSICAL EXAMNIATION:  Gen: NAD,  conversant, well nourised, obese, well groomed  Cardiovascular: Regular rate rhythm, no peripheral edema, warm, nontender. Eyes: Conjunctivae clear without exudates or hemorrhage Neck: Supple, no carotid bruits. Pulmonary: Clear to auscultation bilaterally   NEUROLOGICAL EXAM: MMSE - Mini Mental State Exam 01/23/2018  Orientation to time 5  Orientation to Place 5  Registration 3  Attention/ Calculation 5  Recall 3  Language- name 2 objects 2  Language- repeat 1  Language- follow 3 step command 3  Language- read & follow direction 1  Write a sentence 1  Copy design 0  Total score 29  animal naming 10  CRANIAL NERVES: CN II: Visual fields are full to confrontation.  Pupils are round equal and briskly reactive to light. CN III, IV, VI: extraocular movement are normal.  Mild static right ptosis CN V: Facial sensation is intact to pinprick in all 3 divisions bilaterally. Corneal responses are intact.  CN VII: Face is symmetric with normal eye closure and smile. CN VIII: Hearing is normal to rubbing fingers CN IX, X: Palate elevates symmetrically. Phonation is normal. CN XI: Head turning and shoulder shrug are intact CN XII: Tongue is midline with normal movements and no atrophy.  MOTOR: There was no resting tremor, mild rigidity, left worse than right, mild difficulty with finger tapping, wrist opening and closure, left worse than right, loss of rhythm with left foot tapping, there was no muscle weakness  REFLEXES: Reflexes are 2+ and symmetric at the biceps, triceps, knees, and ankles. Plantar responses are flexor.  SENSORY: Intact to light touch, pinprick, positional sensation and vibratory sensation are intact in fingers and toes.  COORDINATION: Rapid alternating movements and fine finger movements are intact. There is no dysmetria on finger-to-nose and heel-knee-shin.    GAIT/STANCE: She needs pushed up to get up from seated position, mildly decreased left  arm swing lean forward, good stride, mild difficulty turning, mild retropulsion stability  DIAGNOSTIC DATA (LABS, IMAGING, TESTING) - I reviewed patient records, labs, notes, testing and imaging myself where available.   ASSESSMENT AND PLAN  Kimberly Mccullough is a 73 y.o. female   Idiopathic Parkinson's disease  MRI of the brain showed mild generalized atrophy, mild supratentorium small vessel disease,  I have encouraged her continue moderate exercise  Referral to physical therapy  Start Azilect 1 mg daily  Marcial Pacas, M.D. Ph.D.  [facility] [address] [place], [place] [address] Ph: [phone] Fax: [phone]  CC: Jenel Lucks, [place]

## 2018-12-02 ENCOUNTER — Encounter: Payer: Self-pay | Admitting: Neurology

## 2018-12-02 ENCOUNTER — Other Ambulatory Visit: Payer: Self-pay | Admitting: Neurology

## 2018-12-02 MED ORDER — ROPINIROLE HCL 0.25 MG PO TABS: 0.2500 mg | ORAL_TABLET | Freq: Three times a day (TID) | ORAL | 0 refills | Status: DC

## 2018-12-02 NOTE — Telephone Encounter (Signed)
Called the patient to make her aware that a new medication was ordered was requip. Pt requested that I sent the refill to [facility] because it would not be of cost to her. Pt states that she is hesitant in starting the medication after reading side effects. Pt states that she would really prefer to to hold off on starting the medication for now and attempt the therapy first. Advised the patient that she was ordering to hopefully help with treating the tremor/slow movement.

## 2018-12-08 ENCOUNTER — Telehealth: Payer: Self-pay | Admitting: Neurology

## 2018-12-08 NOTE — Telephone Encounter (Signed)
Patient called wanting to know if her ropinirole has been sent to [facility]. She also wanted to know about how much it cost.

## 2018-12-08 NOTE — Telephone Encounter (Signed)
Her prescription was sent to [facility] on 12/02/2018.  She is aware.  She will check with them concerning the price.

## 2018-12-20 ENCOUNTER — Other Ambulatory Visit: Payer: Self-pay | Admitting: Nurse Practitioner

## 2018-12-24 ENCOUNTER — Telehealth: Payer: Self-pay | Admitting: Neurology

## 2018-12-24 NOTE — Telephone Encounter (Signed)
Pt called wanting to discuss the side effects of the rOPINIRole (REQUIP) 0.25 MG tablet before she starts taking the medication. Please advise.

## 2018-12-24 NOTE — Telephone Encounter (Signed)
I spoke to the patient and reviewed the possible side effects of ropinirole.  She verbalized understanding.  She plans to start the medication this week.

## 2019-01-02 ENCOUNTER — Encounter: Payer: Self-pay | Admitting: Physical Therapy

## 2019-01-02 ENCOUNTER — Other Ambulatory Visit: Payer: Self-pay

## 2019-01-02 ENCOUNTER — Ambulatory Visit: Payer: Medicare Other | Attending: Neurology | Admitting: Physical Therapy

## 2019-01-02 DIAGNOSIS — M6281 Muscle weakness (generalized): Secondary | ICD-10-CM | POA: Diagnosis present

## 2019-01-02 DIAGNOSIS — R29818 Other symptoms and signs involving the nervous system: Secondary | ICD-10-CM | POA: Diagnosis present

## 2019-01-02 DIAGNOSIS — R2681 Unsteadiness on feet: Secondary | ICD-10-CM | POA: Diagnosis present

## 2019-01-02 DIAGNOSIS — R2689 Other abnormalities of gait and mobility: Secondary | ICD-10-CM | POA: Insufficient documentation

## 2019-01-02 NOTE — Therapy (Signed)
[facility] [address] [place], [place], [address] Phone: [phone]   Fax:  [phone]  Physical Therapy Evaluation  Patient Details  Name: MONSERRATE BLASCHKE MRN: [id number] Date of Birth: 1945-07-24 Referring Provider (PT): Levert Feinstein, MD   Encounter Date: 01/02/2019  CLINIC OPERATION CHANGES: Outpatient [facility] is open at lower capacity following universal masking, social distancing, and patient screening.  The patient's COVID risk of complications score is 3.   PT End of Session - 01/02/19 0901    Visit Number  1    Number of Visits  9   New episode of care   Date for PT Re-Evaluation  04/02/19    Authorization Type  [employer] Medicare-will need 10th visit progress note    PT Start Time  0805    PT Stop Time  0848    PT Time Calculation (min)  43 min    Activity Tolerance  Patient tolerated treatment well    Behavior During Therapy  WFL for tasks assessed/performed       Past Medical History:  Diagnosis Date  . Arthritis   . Hyperlipidemia   . Hypertension   . No blood products   . Osteopenia   . PTSD (post-traumatic stress disorder)   . Rapid heart beat    takes metoprolol to treat / denies HBP  . Tinnitus    VERY MILD; DOES NOT AFFECT HEARING     Past Surgical History:  Procedure Laterality Date  . ABDOMINAL HYSTERECTOMY    . BUNIONECTOMY     BILATERAL  . CHOLECYSTECTOMY    . JOINT REPLACEMENT    . TOTAL HIP ARTHROPLASTY Left 11/11/2013   Procedure: LEFT TOTAL HIP ARTHROPLASTY ANTERIOR APPROACH;  Surgeon: Loanne Drilling, MD;  Location: [facility];  Service: Orthopedics;  Laterality: Left;  . TOTAL KNEE ARTHROPLASTY Left 08/16/2014   Procedure: LEFT TOTAL KNEE ARTHROPLASTY;  Surgeon: Ollen Gross, MD;  Location: [facility];  Service: Orthopedics;  Laterality: Left;  . TOTAL KNEE ARTHROPLASTY Right 10/22/2016   Procedure: RIGHT TOTAL KNEE ARTHROPLASTY;  Surgeon: Ollen Gross, MD;  Location: [facility];   Service: Orthopedics;  Laterality: Right;    There were no vitals filed for this visit.   Subjective Assessment - 01/02/19 0809    Subjective  "I don't know how long the symptoms have been going on, as I though symptoms were other things."  I've lost my sense of smell, had changes in handwriting in voice.  No noted balance changes, but has stiffness.  No falls in the past 6 months.    Pertinent History  PMH includes arthritis, HTN, osteopenia, PTSD, LTHR, L TKR    Patient Stated Goals  Pt's goal for therapy is to help with stiffness.    Currently in Pain?  No/denies         [facility] PT Assessment - 01/02/19 0001      Assessment   Medical Diagnosis  Parkinson's disease    Referring Provider (PT)  Levert Feinstein, MD    Onset Date/Surgical Date  12/01/18   MD visit   Hand Dominance  Right      Precautions   Precautions  Fall      Balance Screen   Has the patient fallen in the past 6 months  No    Has the patient had a decrease in activity level because of a fear of falling?   No    Is the patient reluctant to leave their home  because of a fear of falling?   No      Home Environment   Living Environment  Private residence    Living Arrangements  Children   Lives with son   Type of Home  House    Home Access  Level entry      Prior Function   Level of Independence  Independent    Vocation  Retired    Leisure  Was going to the gym 3x/wk prior to COVID-19      Observation/Other Assessments   Focus on Therapeutic Outcomes (FOTO)   NA      Posture/Postural Control   Posture Comments  Rounded shoulder, forward head      Tone   Assessment Location  Right Lower Extremity;Left Lower Extremity      ROM / Strength   AROM / PROM / Strength  AROM;Strength      AROM   Overall AROM Comments  WFL lower extremities grossly tested      Strength   Overall Strength Comments  Grossly tested 4/5 bilateral lower extremities      Transfers   Transfers  Sit to Stand;Stand to Sit    Sit to  Stand  6: Modified independent (Device/Increase time);From chair/3-in-1;Without upper extremity assist    Five time sit to stand comments   15.50    Stand to Sit  6: Modified independent (Device/Increase time);Without upper extremity assist;To chair/3-in-1    Comments  Reports has to use UEs for low surface transfers      Ambulation/Gait   Ambulation/Gait  Yes    Ambulation/Gait Assistance  6: Modified independent (Device/Increase time)    Ambulation Distance (Feet)  200 Feet    Assistive device  None    Gait Pattern  Step-through pattern;Decreased arm swing - right;Decreased arm swing - left;Trunk flexed;Narrow base of support;Decreased trunk rotation    Ambulation Surface  Level;Indoor    Gait velocity  10.09 sec = 3.25 ft/sec      High Level Balance   High Level Balance Comments  MiniBESTest score:  19/28 (Scores <22/28 indicate increased fall risk)      Timed Up and Go Test   Normal TUG (seconds)  12.29    Manual TUG (seconds)  12.12    Cognitive TUG (seconds)  15.25    TUG Comments  Scores >13.5-15 sec indicate increased fall risk; scores >10% difference indicate difficulty with dual tasking      RLE Tone   RLE Tone  Mild      LLE Tone   LLE Tone  Mild       Mini-BESTest: Balance Evaluation Systems Test  2005-2013 [facility] & [employer]. All rights reserved. ________________________________________________________________________________________Anticipatory_________Subscore__5___/6 1. SIT TO STAND Instruction: "Cross your arms across your chest. Try not to use your hands unless you must.Do not let your legs lean against the back of the chair when you stand. Please stand up now." X(2) Normal: Comes to stand without use of hands and stabilizes independently. (1) Moderate: Comes to stand WITH use of hands on first attempt. (0) Severe: Unable to stand up from chair without assistance, OR needs several attempts with use of hands. 2. RISE TO TOES Instruction:  "Place your feet shoulder width apart. Place your hands on your hips. Try to rise as high as you can onto your toes. I will count out loud to 3 seconds. Try to hold this pose for at least 3 seconds. Look straight ahead. Rise now." X(2) Normal: Stable for  3 s with maximum height. (1) Moderate: Heels up, but not full range (smaller than when holding hands), OR noticeable instability for 3 s. (0) Severe: < 3 s. 3. STAND ON ONE LEG Instruction: "Look straight ahead. Keep your hands on your hips. Lift your leg off of the ground behind you without touching or resting your raised leg upon your other standing leg. Stay standing on one leg as long as you can. Look straight ahead. Lift Now. Left: Time in Seconds Trial 1:____2.03_Trial 2:__7.78___ (2) Normal: 20 s. X(1) Moderate: < 20 s. (0) Severe: Unable. Right: Time in Seconds Trial 1:__4.28___Trial 2:___8__ (2) Normal: 20 s. X(1) Moderate: < 20 s. (0) Severe: Unable To score each side separately use the trial with the longest time. To calculate the sub-score and total score use the side [left or right] with the lowest numerical score [i.e. the worse side]. ______________________________________________________________________________________Reactive Postural Control___________Subscore:___2__/6 4. COMPENSATORY STEPPING CORRECTION- FORWARD Instruction: "Stand with your feet shoulder width apart, arms at your sides. Lean forward against my hands beyond your forward limits. When I let go, do whatever is necessary, including taking a step, to avoid a fall." (2) Normal: Recovers independently with a single, large step (second realignment step is allowed). X(1) Moderate: More than one step used to recover equilibrium. (0) Severe: No step, OR would fall if not caught, OR falls spontaneously. 5. COMPENSATORY STEPPING CORRECTION- BACKWARD Instruction: "Stand with your feet shoulder width apart, arms at your sides. Lean backward against my hands beyond  your backward limits. When I let go, do whatever is necessary, including taking a step, to avoid a fall." (2) Normal: Recovers independently with a single, large step. X(1) Moderate: More than one step used to recover equilibrium. (0) Severe: No step, OR would fall if not caught, OR falls spontaneously. 6. COMPENSATORY STEPPING CORRECTION- LATERAL Instruction: "Stand with your feet together, arms down at your sides. Lean into my hand beyond your sideways limit. When I let go, do whatever is necessary, including taking a step, to avoid a fall." Left (2) Normal: Recovers independently with 1 step (crossover or lateral OK). (1) Moderate: Several steps to recover equilibrium. X(0) Severe: Falls, or cannot step. Right (2) Normal: Recovers independently with 1 step (crossover or lateral OK). (1) Moderate: Several steps to recover equilibrium. X(0) Severe: Falls, or cannot step. Use the side with the lowest score to calculate sub-score and total score. ____________________________________________________________________________________Sensory Orientation_____________Subscore:_______5__/6 7. STANCE (FEET TOGETHER); EYES OPEN, FIRM SURFACE Instruction: "Place your hands on your hips. Place your feet together until almost touching. Look straight ahead. Be as stable and still as possible, until I say stop." Time in seconds:________ X(2) Normal: 30 s. (1) Moderate: < 30 s. (0) Severe: Unable. 8. STANCE (FEET TOGETHER); EYES CLOSED, FOAM SURFACE Instruction: "Step onto the foam. Place your hands on your hips. Place your feet together until almost touching. Be as stable and still as possible, until I say stop. I will start timing when you close your eyes." Time in seconds:_____8.78___ (2) Normal: 30 s. X(1) Moderate: < 30 s. (0) Severe: Unable. 9. INCLINE- EYES CLOSED Instruction: "Step onto the incline ramp. Please stand on the incline ramp with your toes toward the top. Place your  feet shoulder width apart and have your arms down at your sides. I will start timing when you close your eyes." Time in seconds:________ X(2) Normal: Stands independently 30 s and aligns with gravity. (1) Moderate: Stands independently <30 s OR aligns with surface. (0) Severe: Unable. _________________________________________________________________________________________Dynamic Gait  ______Subscore______7__/10 10. CHANGE IN GAIT SPEED Instruction: "Begin walking at your normal speed, when I tell you 'fast', walk as fast as you can. When I say 'slow', walk very slowly." X(2) Normal: Significantly changes walking speed without imbalance. (1) Moderate: Unable to change walking speed or signs of imbalance. (0) Severe: Unable to achieve significant change in walking speed AND signs of imbalance. 11. WALK WITH HEAD TURNS - HORIZONTAL Instruction: "Begin walking at your normal speed, when I say "right", turn your head and look to the right. When I say "left" turn your head and look to the left. Try to keep yourself walking in a straight line." (2) Normal: performs head turns with no change in gait speed and good balance. X(1) Moderate: performs head turns with reduction in gait speed. (0) Severe: performs head turns with imbalance. 12. WALK WITH PIVOT TURNS Instruction: "Begin walking at your normal speed. When I tell you to 'turn and stop', turn as quickly as you can, face the opposite direction, and stop. After the turn, your feet should be close together." X(2) Normal: Turns with feet close FAST (< 3 steps) with good balance. (1) Moderate: Turns with feet close SLOW (>4 steps) with good balance. (0) Severe: Cannot turn with feet close at any speed without imbalance. 13. STEP OVER OBSTACLES Instruction: "Begin walking at your normal speed. When you get to the box, step over it, not around it and keep walking." (2) Normal: Able to step over box with minimal change of gait speed and with good  balance. X(1) Moderate: Steps over box but touches box OR displays cautious behavior by slowing gait. (0) Severe: Unable to step over box OR steps around box. 14. TIMED UP & GO WITH DUAL TASK [3 METER WALK] Instruction TUG: "When I say 'Go', stand up from chair, walk at your normal speed across the tape on the floor, turn around, and come back to sit in the chair." Instruction TUG with Dual Task: "Count backwards by threes starting at ___. When I say 'Go', stand up from chair, walk at your normal speed across the tape on the floor, turn around, and come back to sit in the chair. Continue counting backwards the entire time." TUG: ________seconds; Dual Task TUG: ________seconds (2) Normal: No noticeable change in sitting, standing or walking while backward counting when compared to TUG without Dual Task. X(1) Moderate: Dual Task affects either counting OR walking (>10%) when compared to the TUG without Dual Task. (0) Severe: Stops counting while walking OR stops walking while counting. When scoring item 14, if subject's gait speed slows more than 10% between the TUG without and with a Dual Task the score should be decreased by a point. TOTAL SCORE: ______19__/28           Objective measurements completed on examination: See above findings.                PT Short Term Goals - 01/02/19 0907      PT SHORT TERM GOAL #1   Title  Pt will be independent with HEP to address Parkinson's-related deficits.  TARGET for STGs, 4 weeks (01/30/2019)-may be delayed due to delayed start (scheduling)    Period  Weeks    Status  New    Target Date  01/30/19      PT SHORT TERM GOAL #2   Title  Pt will improve 5x sit<>stand to less than or equal to 13 seconds to demonstrate improved functional strength, ease of transfers.  Time  4    Period  Weeks    Status  New    Target Date  01/30/19      PT SHORT TERM GOAL #3   Title  Pt will improve MiniBEStest score to at least 22/28 for  decreased fall risk.    Time  4    Period  Weeks    Status  New    Target Date  01/30/19      PT SHORT TERM GOAL #4   Title  Pt will verbalize understanding of local Parkinson's disease-related resources.    Time  4    Period  Weeks    Status  New    Target Date  01/30/19        PT Long Term Goals - 01/02/19 0909      PT LONG TERM GOAL #1   Title  Pt will verbalize plans for continued community fitness and optimal PD fitness routine.  TARGET 8 weeks, 02/27/2019    Time  8    Period  Weeks    Status  New    Target Date  02/27/19      PT LONG TERM GOAL #2   Title  Pt will improve 5x sit<>stand to less than or equal to 11.5 seconds for improved transfer efficiency and functional lower extremity strength.    Time  8    Period  Weeks    Status  New    Target Date  02/27/19      PT LONG TERM GOAL #3   Title  Pt will improve TUG and TUG cog to less than/equal to 10% difference, for improved dual tasking with gait.    Time  8    Period  Weeks    Status  New    Target Date  02/27/19      PT LONG TERM GOAL #4   Title  Pt will verbalize understanding of fall prevention in home environment.    Time  8    Period  Weeks    Status  New    Target Date  02/27/19             Plan - 01/02/19 0902    Clinical Impression Statement  Pt is a 73 year old female [facility], with dx of Parkinson's disease, with recent addition of PD medication (has not yet started taking, Ropinerole).  She presents with decreased balance, postural instability, abnormal posture, bradykinesia, rigidity, decreased timing and coordination with gait.  She has been to the gym in the past (prior to COVID) and was seen for initial bout of PT in early spring.  She demonstrates difficulty with dual tasking with gait and fall risk per MiniBESTest.  She would benefit from skilled PT to address Parkinson's-specific deficits, as noted above, for improved functional mobility and  decreased fall risk.    Personal Factors and Comorbidities  Comorbidity 3+    Comorbidities  PMH includes arthritis, HTN, osteopenia, PTSD, LTHR, L TKR    Examination-Activity Limitations  Locomotion Level;Transfers    Examination-Participation Restrictions  Community Activity   Community fitness   Stability/Clinical Decision Making  Evolving/Moderate complexity    Clinical Decision Making  Moderate    Rehab Potential  Good    PT Frequency  1x / week   per patient request   PT Duration  8 weeks   plus eval   PT Treatment/Interventions  ADLs/Self Care Home Management;Gait training;Functional mobility training;Therapeutic activities;Therapeutic exercise;Patient/family  education;Neuromuscular re-education;Balance training;Manual techniques    PT Next Visit Plan  [facility] Moves in sitting and standing (review if pt still is doing from previous bout of therapy at [facility]); connect [place]! Moves to functional activities, aerobic activity and gait/walking program    Consulted and Agree with Plan of Care  Patient       Patient will benefit from skilled therapeutic intervention in order to improve the following deficits and impairments:  Abnormal gait, Decreased balance, Decreased mobility, Difficulty walking, Decreased strength, Postural dysfunction  Visit Diagnosis: Other abnormalities of gait and mobility  Unsteadiness on feet  Muscle weakness (generalized)  Other symptoms and signs involving the nervous system     Problem List Patient Active Problem List   Diagnosis Date Noted  . Parkinsonism ([place]) 05/26/2018  . Gait abnormality 01/23/2018  . Bradykinesia 01/23/2018  . OA (osteoarthritis) of knee 08/16/2014  . Postoperative anemia due to acute blood loss 11/12/2013  . OA (osteoarthritis) of hip 11/11/2013  . Palpitations 03/11/2012    MARRIOTT,AMY W. 01/02/2019, 9:12 AM Frazier Butt., PT  [facility] [address] [place], [place], [address] Phone: [phone]   Fax:  [phone]  Name: SHELSEA HANGARTNER MRN: [id number] Date of Birth: 1945/07/18

## 2019-01-13 ENCOUNTER — Ambulatory Visit: Payer: Medicare Other | Admitting: Physical Therapy

## 2019-01-13 ENCOUNTER — Encounter: Payer: Self-pay | Admitting: Physical Therapy

## 2019-01-13 ENCOUNTER — Other Ambulatory Visit: Payer: Self-pay

## 2019-01-13 DIAGNOSIS — R2689 Other abnormalities of gait and mobility: Secondary | ICD-10-CM

## 2019-01-13 DIAGNOSIS — M6281 Muscle weakness (generalized): Secondary | ICD-10-CM

## 2019-01-13 DIAGNOSIS — R2681 Unsteadiness on feet: Secondary | ICD-10-CM

## 2019-01-13 NOTE — Therapy (Signed)
[facility] [address] [place], [place], [address] Phone: [phone]   Fax:  [phone]  Physical Therapy Treatment  Patient Details  Name: Kimberly Mccullough MRN: [id number] Date of Birth: [birth year] Referring Provider (PT): Levert FeinsteinYijun Yan, MD   Encounter Date: 01/13/2019  PT End of Session - 01/13/19 1133    Visit Number  2    Number of Visits  9   New episode of care   Date for PT Re-Evaluation  04/02/19    Authorization Type  [employer] Medicare-will need 10th visit progress note    PT Start Time  0847    PT Stop Time  0933    PT Time Calculation (min)  46 min    Activity Tolerance  Patient tolerated treatment well    Behavior During Therapy  [facility] for tasks assessed/performed       Past Medical History:  Diagnosis Date  . Arthritis   . Hyperlipidemia   . Hypertension   . No blood products   . Osteopenia   . PTSD (post-traumatic stress disorder)   . Rapid heart beat    takes metoprolol to treat / denies HBP  . Tinnitus    VERY MILD; DOES NOT AFFECT HEARING     Past Surgical History:  Procedure Laterality Date  . ABDOMINAL HYSTERECTOMY    . BUNIONECTOMY     BILATERAL  . CHOLECYSTECTOMY    . JOINT REPLACEMENT    . TOTAL HIP ARTHROPLASTY Left 11/11/2013   Procedure: LEFT TOTAL HIP ARTHROPLASTY ANTERIOR APPROACH;  Surgeon: Loanne DrillingFrank Aluisio V, MD;  Location: [facility];  Service: Orthopedics;  Laterality: Left;  . TOTAL KNEE ARTHROPLASTY Left 08/16/2014   Procedure: LEFT TOTAL KNEE ARTHROPLASTY;  Surgeon: Ollen GrossFrank Aluisio, MD;  Location: [facility];  Service: Orthopedics;  Laterality: Left;  . TOTAL KNEE ARTHROPLASTY Right 10/22/2016   Procedure: RIGHT TOTAL KNEE ARTHROPLASTY;  Surgeon: Ollen GrossAluisio, Frank, MD;  Location: [facility];  Service: Orthopedics;  Laterality: Right;    There were no vitals filed for this visit.  Subjective Assessment - 01/13/19 1126    Subjective  Pt denies falls or changes.  States she just started the  requip on Sunday.  Asking "What is this medicine supposed to do for me?"  Pt asking multiple questions about medication and her Parkinson's presentation.    Pertinent History  PMH includes arthritis, HTN, osteopenia, PTSD, LTHR, L TKR    Patient Stated Goals  Pt's goal for therapy is to help with stiffness.    Currently in Pain?  No/denies         [facility] - 01/13/19 0001      Posture/Postural Control   Posture/Postural Control  Postural limitations    Postural Limitations  Rounded Shoulders      Self-Care   Self-Care  --        [facility] - 01/13/19 1128    PWR! exercises  Moves in sitting    PWR! Up  x 20    PWR! Rock   x 20    PWR! Twist   x 20    PWR! Step   x 20     Comments  In sitting and used mirror for visual feedback.  Discussed importance of intensity along with increased extension with all activities.  Pt tending to keep elbows flexed with movements along with fingers flexed.  Required moderate verbal, tactile and visual cues.  Provided education on YouTube video as resource.  Discussed  how exercises impact daily functional activities and importance of the exercises.          PT Education - 01/13/19 1132    Education Details  Importance of exercises and PD, PWR! moves in sitting, YouTube resource for PWR sitting video, how PD impacts overall mobility    Person(s) Educated  Patient    Methods  Explanation;Handout    Comprehension  Verbalized understanding       PT Short Term Goals - 01/02/19 0907      PT SHORT TERM GOAL #1   Title  Pt will be independent with HEP to address Parkinson's-related deficits.  TARGET for STGs, 4 weeks (01/30/2019)-may be delayed due to delayed start (scheduling)    Period  Weeks    Status  New    Target Date  01/30/19      PT SHORT TERM GOAL #2   Title  Pt will improve 5x sit<>stand to less than or equal to 13 seconds to demonstrate improved functional strength, ease of transfers.    Time  4    Period   Weeks    Status  New    Target Date  01/30/19      PT SHORT TERM GOAL #3   Title  Pt will improve MiniBEStest score to at least 22/28 for decreased fall risk.    Time  4    Period  Weeks    Status  New    Target Date  01/30/19      PT SHORT TERM GOAL #4   Title  Pt will verbalize understanding of local Parkinson's disease-related resources.    Time  4    Period  Weeks    Status  New    Target Date  01/30/19        PT Long Term Goals - 01/02/19 0909      PT LONG TERM GOAL #1   Title  Pt will verbalize plans for continued community fitness and optimal PD fitness routine.  TARGET 8 weeks, 02/27/2019    Time  8    Period  Weeks    Status  New    Target Date  02/27/19      PT LONG TERM GOAL #2   Title  Pt will improve 5x sit<>stand to less than or equal to 11.5 seconds for improved transfer efficiency and functional lower extremity strength.    Time  8    Period  Weeks    Status  New    Target Date  02/27/19      PT LONG TERM GOAL #3   Title  Pt will improve TUG and TUG cog to less than/equal to 10% difference, for improved dual tasking with gait.    Time  8    Period  Weeks    Status  New    Target Date  02/27/19      PT LONG TERM GOAL #4   Title  Pt will verbalize understanding of fall prevention in home environment.    Time  8    Period  Weeks    Status  New    Target Date  02/27/19            Plan - 01/13/19 1134    Clinical Impression Statement  Skilled session focused on initiating HEP along with education.  Per notes in Epic from PT session in May 2020, pt had been instructed in PWR Moves in sitting during those sessions but pt did not appear  to remember doing these exercises previously.  Pt reports she just started Requip on sunday and is questioning purpose of the medication despite education from Dr Krista Blue.  Pt reporting issues with swallowing/feeling like something is stuck in her throat along with fine motor activities.  Continue PT per POC.    Personal  Factors and Comorbidities  Comorbidity 3+    Comorbidities  PMH includes arthritis, HTN, osteopenia, PTSD, LTHR, L TKR    Examination-Activity Limitations  Locomotion Level;Transfers    Examination-Participation Restrictions  Community Activity   Community fitness   Stability/Clinical Decision Making  Evolving/Moderate complexity    Rehab Potential  Good    PT Frequency  1x / week   per patient request   PT Duration  8 weeks   plus eval   PT Treatment/Interventions  ADLs/Self Care Home Management;Gait training;Functional mobility training;Therapeutic activities;Therapeutic exercise;Patient/family education;Neuromuscular re-education;Balance training;Manual techniques    PT Next Visit Plan  Review PWR! Moves in sitting from HEP.  Add PWR! standing.  provide information on PD resources (NPF, Aware in Care, etc...) connect PWR! Moves to functional activities, aerobic activity and gait/walking program.  Pt reports she has a stationary bike at home.    Recommended Other Services  Discuss pt's c/o swallowing/speech issues and fine motor issues with primary PT.    Consulted and Agree with Plan of Care  Patient       Patient will benefit from skilled therapeutic intervention in order to improve the following deficits and impairments:  Abnormal gait, Decreased balance, Decreased mobility, Difficulty walking, Decreased strength, Postural dysfunction  Visit Diagnosis: Other abnormalities of gait and mobility  Unsteadiness on feet  Muscle weakness (generalized)     Problem List Patient Active Problem List   Diagnosis Date Noted  . Parkinsonism ([place]) 05/26/2018  . Gait abnormality 01/23/2018  . Bradykinesia 01/23/2018  . OA (osteoarthritis) of knee 08/16/2014  . Postoperative anemia due to acute blood loss 11/12/2013  . OA (osteoarthritis) of hip 11/11/2013  . Palpitations 03/11/2012    Narda Bonds, PTA [place] 01/13/19 11:41 AM Phone:  [phone] Fax: [facility] [address] [place], [place], [address] Phone: [phone]   Fax:  [phone]  Name: Kimberly Mccullough MRN: [id number] Date of Birth: 1945/07/16

## 2019-01-13 NOTE — Patient Instructions (Addendum)
      Search the internet for "PWR moves sitting youtube" and will show videos of the exercises.

## 2019-01-20 ENCOUNTER — Other Ambulatory Visit: Payer: Self-pay | Admitting: Neurology

## 2019-01-21 ENCOUNTER — Other Ambulatory Visit: Payer: Self-pay

## 2019-01-21 ENCOUNTER — Ambulatory Visit: Payer: Medicare Other | Admitting: Physical Therapy

## 2019-01-21 DIAGNOSIS — M6281 Muscle weakness (generalized): Secondary | ICD-10-CM

## 2019-01-21 DIAGNOSIS — R2689 Other abnormalities of gait and mobility: Secondary | ICD-10-CM | POA: Diagnosis not present

## 2019-01-21 DIAGNOSIS — R2681 Unsteadiness on feet: Secondary | ICD-10-CM

## 2019-01-21 NOTE — Patient Instructions (Addendum)
Sit to Stand / Stand to Sit / Transfers    Sit on edge of a solid chair (toilet seat), tuck your feet under your knees.  Feet will be shoulder-width apart.  Use your hands at the edge of seat to BOOST yourself up as you lean forward over feet and stand up TALL . Sit down slowly. Repeat __5__ times per session. Do ___2_ sessions per day.  Copyright  VHI. All rights reserved.

## 2019-01-21 NOTE — Therapy (Signed)
[facility] [address] [place], [place], [address] Phone: [phone]   Fax:  [phone]  Physical Therapy Treatment  Patient Details  Name: LAKEYSHA SLUTSKY MRN: [id number] Date of Birth: 05-17-1945 Referring Provider (PT): Levert Feinstein, MD   Encounter Date: 01/21/2019  PT End of Session - 01/21/19 1707    Visit Number  3    Number of Visits  9   New episode of care   Date for PT Re-Evaluation  04/02/19    Authorization Type  [employer] Medicare-will need 10th visit progress note    PT Start Time  0850    PT Stop Time  0934    PT Time Calculation (min)  44 min    Activity Tolerance  Patient tolerated treatment well    Behavior During Therapy  [facility] for tasks assessed/performed       Past Medical History:  Diagnosis Date  . Arthritis   . Hyperlipidemia   . Hypertension   . No blood products   . Osteopenia   . PTSD (post-traumatic stress disorder)   . Rapid heart beat    takes metoprolol to treat / denies HBP  . Tinnitus    VERY MILD; DOES NOT AFFECT HEARING     Past Surgical History:  Procedure Laterality Date  . ABDOMINAL HYSTERECTOMY    . BUNIONECTOMY     BILATERAL  . CHOLECYSTECTOMY    . JOINT REPLACEMENT    . TOTAL HIP ARTHROPLASTY Left 11/11/2013   Procedure: LEFT TOTAL HIP ARTHROPLASTY ANTERIOR APPROACH;  Surgeon: Loanne Drilling, MD;  Location: [facility];  Service: Orthopedics;  Laterality: Left;  . TOTAL KNEE ARTHROPLASTY Left 08/16/2014   Procedure: LEFT TOTAL KNEE ARTHROPLASTY;  Surgeon: Ollen Gross, MD;  Location: [facility];  Service: Orthopedics;  Laterality: Left;  . TOTAL KNEE ARTHROPLASTY Right 10/22/2016   Procedure: RIGHT TOTAL KNEE ARTHROPLASTY;  Surgeon: Ollen Gross, MD;  Location: [facility];  Service: Orthopedics;  Laterality: Right;    There were no vitals filed for this visit.  Subjective Assessment - 01/21/19 0852    Subjective  No changes, no falls.  I feel like the exercises should be  harder-maybe I'm not doing them right.    Pertinent History  PMH includes arthritis, HTN, osteopenia, PTSD, LTHR, L TKR    Patient Stated Goals  Pt's goal for therapy is to help with stiffness.    Currently in Pain?  No/denies                       [facility] Adult PT Treatment/Exercise - 01/21/19 0001      Transfers   Transfers  Sit to Stand;Stand to Sit    Sit to Stand  5: Supervision;6: Modified independent (Device/Increase time);With upper extremity assist;From chair/3-in-1    Stand to Sit  5: Supervision;6: Modified independent (Device/Increase time);With upper extremity assist;To chair/3-in-1    Number of Reps  2 sets;Other reps (comment)   5 reps form 16" chair surface   Comments  Pt concerned about difficulty getting up from toilet, as she has to use UEs at wall and sink to help pull up into standing.  Practiced proper sit to stand technique, including foot placement and increased forward lean, rocking for momentum (tried with hands at knees or hands pushing forward, but pt unable).  Transitioned to sit<>stand from 16" surface with use of UEs to "boost" up to stand and pt able to perform more efficiently.  PWR [facility]) - 01/21/19 6160    PWR! exercises  Moves in sitting;Moves in standing    PWR! Up  x 10 reps    PWR! Rock  x 10 reps    PWR Step  x 10 reps    Comments-Standing  Chair in front for support; visual and verbal cues provided for technique, amplitude    PWR! Sit to Stand  Sit to stand performed from 24" mat, with cues for foot placement, technique for improved ease of transfers.      PWR! Up  x20    PWR! Rock  x 20    PWR! Twist  2 sets of 10 reps each side    PWR! Step  2 sets x 10 reps    Comments-Sitting  REview of HEP with PT providing visual, verbal cues for technique and amplitude        Discussed intensity of PWR! Moves exercises and had pt rate effort level while performing.  Pt rates as 5-7/10.  Discussed intensity and effort should be at  least 6-8/10 when performing PWR! Moves at home.  Discussed relation to each PWR! Moves exercise to functional activities in pt's day.  (PWR! Up for posture, PWR! Rock for [employer], [place]! Twist for trunk flexibility, PWR! Step for step initiation.)   PT Education - 01/21/19 1706    Education Details  Technique for sit<>stand, especially from low surfaces    Person(s) Educated  Patient    Methods  Explanation;Demonstration;Verbal cues;Handout    Comprehension  Verbalized understanding;Returned demonstration;Verbal cues required       PT Short Term Goals - 01/02/19 0907      PT SHORT TERM GOAL #1   Title  Pt will be independent with HEP to address Parkinson's-related deficits.  TARGET for STGs, 4 weeks (01/30/2019)-may be delayed due to delayed start (scheduling)    Period  Weeks    Status  New    Target Date  01/30/19      PT SHORT TERM GOAL #2   Title  Pt will improve 5x sit<>stand to less than or equal to 13 seconds to demonstrate improved functional strength, ease of transfers.    Time  4    Period  Weeks    Status  New    Target Date  01/30/19      PT SHORT TERM GOAL #3   Title  Pt will improve MiniBEStest score to at least 22/28 for decreased fall risk.    Time  4    Period  Weeks    Status  New    Target Date  01/30/19      PT SHORT TERM GOAL #4   Title  Pt will verbalize understanding of local Parkinson's disease-related resources.    Time  4    Period  Weeks    Status  New    Target Date  01/30/19        PT Long Term Goals - 01/02/19 0909      PT LONG TERM GOAL #1   Title  Pt will verbalize plans for continued community fitness and optimal PD fitness routine.  TARGET 8 weeks, 02/27/2019    Time  8    Period  Weeks    Status  New    Target Date  02/27/19      PT LONG TERM GOAL #2   Title  Pt will improve 5x sit<>stand to less than or equal to 11.5 seconds for improved transfer efficiency and functional  lower extremity strength.    Time  8    Period   Weeks    Status  New    Target Date  02/27/19      PT LONG TERM GOAL #3   Title  Pt will improve TUG and TUG cog to less than/equal to 10% difference, for improved dual tasking with gait.    Time  8    Period  Weeks    Status  New    Target Date  02/27/19      PT LONG TERM GOAL #4   Title  Pt will verbalize understanding of fall prevention in home environment.    Time  8    Period  Weeks    Status  New    Target Date  02/27/19            Plan - 01/21/19 1707    Clinical Impression Statement  Review of HEP for PWR! Moves in sitting, with pt return demo understanding.  PT does instruct patient on increased amplitude/intensity of movement during PWR! Moves in sitting as well as in standing.  With instruction in and practice with low surface transfers, pt shows improved ease of trasnfers from 16" surfaces.  Pt continues to be concerned about lower (toilet seat transfers), but educated pt in practice of proper technique to help with improved lower extremity funcitonal strengthening for improved ease of transfers.  Pt will continue to benefit from skilled PT to address balance, transfes, and gait.    Personal Factors and Comorbidities  Comorbidity 3+    Comorbidities  PMH includes arthritis, HTN, osteopenia, PTSD, LTHR, L TKR    Examination-Activity Limitations  Locomotion Level;Transfers    Examination-Participation Restrictions  Community Activity   Community fitness   Stability/Clinical Decision Making  Evolving/Moderate complexity    Rehab Potential  Good    PT Frequency  1x / week   per patient request   PT Duration  8 weeks   plus eval   PT Treatment/Interventions  ADLs/Self Care Home Management;Gait training;Functional mobility training;Therapeutic activities;Therapeutic exercise;Patient/family education;Neuromuscular re-education;Balance training;Manual techniques    PT Next Visit Plan  Perform PWR! Moves in standing and add to HEP as appropriate.  Review sit<>Stand and  continue practice with sit<>stand from low surfaces, simulating toilet.  Provide information on PD resources (NPF, Aware in Care, etc...) connect PWR! Moves to functional activities, aerobic activity and gait/walking program.  Pt reports she has a stationary bike at home.    Consulted and Agree with Plan of Care  Patient       Patient will benefit from skilled therapeutic intervention in order to improve the following deficits and impairments:  Abnormal gait, Decreased balance, Decreased mobility, Difficulty walking, Decreased strength, Postural dysfunction  Visit Diagnosis: Unsteadiness on feet  Muscle weakness (generalized)     Problem List Patient Active Problem List   Diagnosis Date Noted  . Parkinsonism ([facility]) 05/26/2018  . Gait abnormality 01/23/2018  . Bradykinesia 01/23/2018  . OA (osteoarthritis) of knee 08/16/2014  . Postoperative anemia due to acute blood loss 11/12/2013  . OA (osteoarthritis) of hip 11/11/2013  . Palpitations 03/11/2012    Amanie Mcculley W. 01/21/2019, 5:15 PM  Gean Maidens., PT   [facility] [address] [place], [place], [address] Phone: [phone]   Fax:  [phone]  Name: ANDELYN MAGALLANEZ MRN: [id number] Date of Birth: [birth year]

## 2019-01-29 ENCOUNTER — Encounter: Payer: Self-pay | Admitting: Physical Therapy

## 2019-01-29 ENCOUNTER — Ambulatory Visit: Payer: Medicare Other | Attending: Neurology | Admitting: Physical Therapy

## 2019-01-29 ENCOUNTER — Other Ambulatory Visit: Payer: Self-pay

## 2019-01-29 DIAGNOSIS — R29818 Other symptoms and signs involving the nervous system: Secondary | ICD-10-CM | POA: Insufficient documentation

## 2019-01-29 DIAGNOSIS — R2681 Unsteadiness on feet: Secondary | ICD-10-CM | POA: Insufficient documentation

## 2019-01-29 DIAGNOSIS — R2689 Other abnormalities of gait and mobility: Secondary | ICD-10-CM | POA: Diagnosis present

## 2019-01-29 DIAGNOSIS — M6281 Muscle weakness (generalized): Secondary | ICD-10-CM | POA: Insufficient documentation

## 2019-01-29 NOTE — Patient Instructions (Signed)
Provided patient with handout for local and online resources for PD

## 2019-01-29 NOTE — Therapy (Signed)
[facility] [address] [place], [place], [address] Phone: [phone]   Fax:  [phone]  Physical Therapy Treatment  Patient Details  Name: Kimberly Mccullough MRN: [id number] Date of Birth: 12/16/1945 Referring Provider (PT): Levert FeinsteinYijun Yan, MD   Encounter Date: 01/29/2019  PT End of Session - 01/29/19 1427    Visit Number  4    Number of Visits  9   New episode of care   Date for PT Re-Evaluation  04/02/19    Authorization Type  [employer] Medicare-will need 10th visit progress note    PT Start Time  0848    PT Stop Time  0931    PT Time Calculation (min)  43 min    Activity Tolerance  Patient tolerated treatment well    Behavior During Therapy  [facility] for tasks assessed/performed       Past Medical History:  Diagnosis Date  . Arthritis   . Hyperlipidemia   . Hypertension   . No blood products   . Osteopenia   . PTSD (post-traumatic stress disorder)   . Rapid heart beat    takes metoprolol to treat / denies HBP  . Tinnitus    VERY MILD; DOES NOT AFFECT HEARING     Past Surgical History:  Procedure Laterality Date  . ABDOMINAL HYSTERECTOMY    . BUNIONECTOMY     BILATERAL  . CHOLECYSTECTOMY    . JOINT REPLACEMENT    . TOTAL HIP ARTHROPLASTY Left 11/11/2013   Procedure: LEFT TOTAL HIP ARTHROPLASTY ANTERIOR APPROACH;  Surgeon: Loanne DrillingFrank Aluisio V, MD;  Location: [facility];  Service: Orthopedics;  Laterality: Left;  . TOTAL KNEE ARTHROPLASTY Left 08/16/2014   Procedure: LEFT TOTAL KNEE ARTHROPLASTY;  Surgeon: Ollen GrossFrank Aluisio, MD;  Location: [facility];  Service: Orthopedics;  Laterality: Left;  . TOTAL KNEE ARTHROPLASTY Right 10/22/2016   Procedure: RIGHT TOTAL KNEE ARTHROPLASTY;  Surgeon: Ollen GrossAluisio, Frank, MD;  Location: [facility];  Service: Orthopedics;  Laterality: Right;    There were no vitals filed for this visit.  Subjective Assessment - 01/29/19 0851    Subjective  Had the same pain earlier this week that I had when I was  taking Statins (allergic to them).  Pt questions if Requip is similar to  Statins.    Pertinent History  PMH includes arthritis, HTN, osteopenia, PTSD, LTHR, L TKR    Patient Stated Goals  Pt's goal for therapy is to help with stiffness.    Currently in Pain?  No/denies    Multiple Pain Sites  No                       [facility] Adult PT Treatment/Exercise - 01/29/19 0001      Transfers   Transfers  Sit to Stand;Stand to Sit    Sit to Stand  5: Supervision;6: Modified independent (Device/Increase time);From chair/3-in-1;Without upper extremity assist    Five time sit to stand comments   13.05    Stand to Sit  6: Modified independent (Device/Increase time);Without upper extremity assist;To chair/3-in-1    Comments  Additional 5 reps sit to stand with minimal UE support, reviewing HEP from last visit      Standardized Balance Assessment   Standardized Balance Assessment  Timed Up and Go Test      Timed Up and Go Test   TUG  Normal TUG;Cognitive TUG    Normal TUG (seconds)  11.15    Cognitive TUG (seconds)  14.25  High Level Balance   High Level Balance Comments  MiniBESTest score:  24/28 (Scores <22/28 indicate increased fall risk)-see note for full details      Self-Care   Self-Care  Other Self-Care Comments    Other Self-Care Comments   Discussed progress towards goals, POC; discussed Parkinson's disease local and online resources.        P[facility]) - 01/29/19 0454    PWR! exercises  Moves in standing    PWR! Up  x 10 reps    PWR! Rock  x 10 reps each side    PWR Step  x 5 reps each side    Comments  Performed near counter, cues for technique, amplitude of movement        Pt reports handwriting issues and PT mentions options for OT to address.  Pt in agreement with PT requesting order for OT from Dr. Terrace Arabia.  Discussed given pt's continued reported difficulty with low surface toilet transfers, using 3:1 commode over her toilet for increased height and ability  to use rails.  She has one at home, but is hesitant to use.  Mini-BESTest: Balance Evaluation Systems Test  2005-2013 [facility] & [employer]. All rights reserved. ________________________________________________________________________________________Anticipatory_________Subscore___5_/6 1. SIT TO STAND Instruction: "Cross your arms across your chest. Try not to use your hands unless you must.Do not let your legs lean against the back of the chair when you stand. Please stand up now." X(2) Normal: Comes to stand without use of hands and stabilizes independently. (1) Moderate: Comes to stand WITH use of hands on first attempt. (0) Severe: Unable to stand up from chair without assistance, OR needs several attempts with use of hands. 2. RISE TO TOES Instruction: "Place your feet shoulder width apart. Place your hands on your hips. Try to rise as high as you can onto your toes. I will count out loud to 3 seconds. Try to hold this pose for at least 3 seconds. Look straight ahead. Rise now." X(2) Normal: Stable for 3 s with maximum height. (1) Moderate: Heels up, but not full range (smaller than when holding hands), OR noticeable instability for 3 s. (0) Severe: < 3 s. 3. STAND ON ONE LEG Instruction: "Look straight ahead. Keep your hands on your hips. Lift your leg off of the ground behind you without touching or resting your raised leg upon your other standing leg. Stay standing on one leg as long as you can. Look straight ahead. Lift now." Left: Time in Seconds Trial 1:___5.63_Trial 2:___1.03_ (2) Normal: 20 s. X(1) Moderate: < 20 s. (0) Severe: Unable. Right: Time in Seconds Trial 1:_16.44____Trial 2:___15.5__ (2) Normal: 20 s. X(1) Moderate: < 20 s. (0) Severe: Unable To score each side separately use the trial with the longest time. To calculate the sub-score and total score use the side [left or right] with the lowest numerical score [i.e. the worse  side]. ______________________________________________________________________________________Reactive Postural Control___________Subscore:___6__/6 4. COMPENSATORY STEPPING CORRECTION- FORWARD Instruction: "Stand with your feet shoulder width apart, arms at your sides. Lean forward against my hands beyond your forward limits. When I let go, do whatever is necessary, including taking a step, to avoid a fall." X(2) Normal: Recovers independently with a single, large step (second realignment step is allowed). (1) Moderate: More than one step used to recover equilibrium. (0) Severe: No step, OR would fall if not caught, OR falls spontaneously. 5. COMPENSATORY STEPPING CORRECTION- BACKWARD Instruction: "Stand with your feet shoulder width apart, arms at your sides. Lean backward against  my hands beyond your backward limits. When I let go, do whatever is necessary, including taking a step, to avoid a fall." X(2) Normal: Recovers independently with a single, large step. (1) Moderate: More than one step used to recover equilibrium. (0) Severe: No step, OR would fall if not caught, OR falls spontaneously. 6. COMPENSATORY STEPPING CORRECTION- LATERAL Instruction: "Stand with your feet together, arms down at your sides. Lean into my hand beyond your sideways limit. When I let go, do whatever is necessary, including taking a step, to avoid a fall." Left X(2) Normal: Recovers independently with 1 step (crossover or lateral OK). (1) Moderate: Several steps to recover equilibrium. (0) Severe: Falls, or cannot step. Right X(2) Normal: Recovers independently with 1 step (crossover or lateral OK). (1) Moderate: Several steps to recover equilibrium. (0) Severe: Falls, or cannot step. Use the side with the lowest score to calculate sub-score and total score. ____________________________________________________________________________________Sensory Orientation_____________Subscore:____5_____/6 7. STANCE  (FEET TOGETHER); EYES OPEN, FIRM SURFACE Instruction: "Place your hands on your hips. Place your feet together until almost touching. Look straight ahead. Be as stable and still as possible, until I say stop." Time in seconds:________ X(2) Normal: 30 s. (1) Moderate: < 30 s. (0) Severe: Unable. 8. STANCE (FEET TOGETHER); EYES CLOSED, FOAM SURFACE Instruction: "Step onto the foam. Place your hands on your hips. Place your feet together until almost touching. Be as stable and still as possible, until I say stop. I will start timing when you close your eyes." Time in seconds:____6.53____ (2) Normal: 30 s. X(1) Moderate: < 30 s. (0) Severe: Unable. 9. INCLINE- EYES CLOSED Instruction: "Step onto the incline ramp. Please stand on the incline ramp with your toes toward the top. Place your feet shoulder width apart and have your arms down at your sides. I will start timing when you close your eyes." Time in seconds:________ X(2) Normal: Stands independently 30 s and aligns with gravity. (1) Moderate: Stands independently <30 s OR aligns with surface. (0) Severe: Unable. _________________________________________________________________________________________Dynamic Gait ______Subscore____7____/10 10. CHANGE IN GAIT SPEED Instruction: "Begin walking at your normal speed, when I tell you 'fast', walk as fast as you can. When I say 'slow', walk very slowly." X(2) Normal: Significantly changes walking speed without imbalance. (1) Moderate: Unable to change walking speed or signs of imbalance. (0) Severe: Unable to achieve significant change in walking speed AND signs of imbalance. [place] - HORIZONTAL Instruction: "Begin walking at your normal speed, when I say "right", turn your head and look to the right. When I say "left" turn your head and look to the left. Try to keep yourself walking in a straight line." (2) Normal: performs head turns with no change in gait speed and good  balance. X(1) Moderate: performs head turns with reduction in gait speed. (0) Severe: performs head turns with imbalance. 12. WALK WITH PIVOT TURNS Instruction: "Begin walking at your normal speed. When I tell you to 'turn and stop', turn as quickly as you can, face the opposite direction, and stop. After the turn, your feet should be close together." X(2) Normal: Turns with feet close FAST (< 3 steps) with good balance. (1) Moderate: Turns with feet close SLOW (>4 steps) with good balance. (0) Severe: Cannot turn with feet close at any speed without imbalance. 13. STEP OVER OBSTACLES Instruction: "Begin walking at your normal speed. When you get to the box, step over it, not around it and keep walking." (2) Normal: Able to step over box with  minimal change of gait speed and with good balance. X(1) Moderate: Steps over box but touches box OR displays cautious behavior by slowing gait. (0) Severe: Unable to step over box OR steps around box. 14. TIMED UP & GO WITH DUAL TASK [3 METER WALK] Instruction TUG: "When I say 'Go', stand up from chair, walk at your normal speed across the tape on the floor, turn around, and come back to sit in the chair." Instruction TUG with Dual Task: "Count backwards by threes starting at ___. When I say 'Go', stand up from chair, walk at your normal speed across the tape on the floor, turn around, and come back to sit in the chair. Continue counting backwards the entire time." TUG: ____11.15____seconds; Dual Task TUG: __14.25______seconds (2) Normal: No noticeable change in sitting, standing or walking while backward counting when compared to TUG without Dual Task. X(1) Moderate: Dual Task affects either counting OR walking (>10%) when compared to the TUG without Dual Task. (0) Severe: Stops counting while walking OR stops walking while counting. When scoring item 14, if subject's gait speed slows more than 10% between the TUG without and with a Dual Task the  score should be decreased by a point. TOTAL SCORE: ____24____/28   PT Education - 01/29/19 1426    Education Details  PD local and online resources including [employer]; progress towards goals, POC    Person(s) Educated  Patient    Methods  Explanation;Demonstration    Comprehension  Verbalized understanding       PT Short Term Goals - 01/29/19 0855      PT SHORT TERM GOAL #1   Title  Pt will be independent with HEP to address Parkinson's-related deficits.  TARGET for STGs, 4 weeks (01/30/2019)-may be delayed due to delayed start (scheduling)    Period  Weeks    Status  On-going    Target Date  01/30/19      PT SHORT TERM GOAL #2   Title  Pt will improve 5x sit<>stand to less than or equal to 13 seconds to demonstrate improved functional strength, ease of transfers.    Baseline  13.05 sec 01/29/2019    Time  4    Period  Weeks    Status  Achieved    Target Date  01/30/19      PT SHORT TERM GOAL #3   Title  Pt will improve MiniBEStest score to at least 22/28 for decreased fall risk.    Time  4    Period  Weeks    Status  Achieved    Target Date  01/30/19      PT SHORT TERM GOAL #4   Title  Pt will verbalize understanding of local Parkinson's disease-related resources.    Time  4    Period  Weeks    Status  Achieved    Target Date  01/30/19        PT Long Term Goals - 01/02/19 0909      PT LONG TERM GOAL #1   Title  Pt will verbalize plans for continued community fitness and optimal PD fitness routine.  TARGET 8 weeks, 02/27/2019    Time  8    Period  Weeks    Status  New    Target Date  02/27/19      PT LONG TERM GOAL #2   Title  Pt will improve 5x sit<>stand to less than or equal to 11.5 seconds for improved transfer efficiency  and functional lower extremity strength.    Time  8    Period  Weeks    Status  New    Target Date  02/27/19      PT LONG TERM GOAL #3   Title  Pt will improve TUG and TUG cog to less than/equal to 10%  difference, for improved dual tasking with gait.    Time  8    Period  Weeks    Status  New    Target Date  02/27/19      PT LONG TERM GOAL #4   Title  Pt will verbalize understanding of fall prevention in home environment.    Time  8    Period  Weeks    Status  New    Target Date  02/27/19            Plan - 01/29/19 1428    Clinical Impression Statement  Began assessing STGs this visit, with pt meeting STG 2, 3, 4.  STG 1 ongoing, as pt requires cues for amplitude and technique of exercises in HEP.  Pt has demonstrated improvement in 5x sit<>stand and MiniBESTest measures; she still reports difficulty with low surface transfers and she has difficulty on compliant surfaces with eyes closed.  In addition, she needs cues for increased amplitude of movement patterns due to bradykinesia.  She will continue to benefit from skilled PT to address balance, posture, transfers, and gait for overall improved mobility.    Personal Factors and Comorbidities  Comorbidity 3+    Comorbidities  PMH includes arthritis, HTN, osteopenia, PTSD, LTHR, L TKR    Examination-Activity Limitations  Locomotion Level;Transfers    Examination-Participation Restrictions  Community Activity   Community fitness   Stability/Clinical Decision Making  Evolving/Moderate complexity    Rehab Potential  Good    PT Frequency  1x / week   per patient request   PT Duration  8 weeks   plus eval   PT Treatment/Interventions  ADLs/Self Care Home Management;Gait training;Functional mobility training;Therapeutic activities;Therapeutic exercise;Patient/family education;Neuromuscular re-education;Balance training;Manual techniques    PT Next Visit Plan  Perform PWR! Moves in standing and add to HEP as appropriate; review sitting PWR! Moves.  Review sit<>Stand and continue practice with sit<>stand from low surfaces, simulating toilet.  Provide information on PD resources (NPF, Aware in Care, etc...) connect PWR! Moves to functional  activities, aerobic activity and gait/walking program.  Pt reports she has a stationary bike at home.    Recommended Other Services  Pt reports difficulty iwth handwriting and agrees to OT referral (01/29/2019)    Consulted and Agree with Plan of Care  Patient       Patient will benefit from skilled therapeutic intervention in order to improve the following deficits and impairments:  Abnormal gait, Decreased balance, Decreased mobility, Difficulty walking, Decreased strength, Postural dysfunction  Visit Diagnosis: Unsteadiness on feet  Other abnormalities of gait and mobility  Other symptoms and signs involving the nervous system     Problem List Patient Active Problem List   Diagnosis Date Noted  . Parkinsonism (HCC) 05/26/2018  . Gait abnormality 01/23/2018  . Bradykinesia 01/23/2018  . OA (osteoarthritis) of knee 08/16/2014  . Postoperative anemia due to acute blood loss 11/12/2013  . OA (osteoarthritis) of hip 11/11/2013  . Palpitations 03/11/2012    Cyndra Feinberg W. 01/29/2019, 2:32 PM  Gean Maidens., PT   [place] [facility] [place] [address] [place], [place], [address] Phone: [phone]   Fax:  [phone]  Name: LASHANE WHELPLEY MRN: [id number] Date of Birth: [birth year]

## 2019-01-30 ENCOUNTER — Telehealth: Payer: Self-pay | Admitting: Physical Therapy

## 2019-01-30 DIAGNOSIS — M6281 Muscle weakness (generalized): Secondary | ICD-10-CM

## 2019-01-30 NOTE — Telephone Encounter (Signed)
Dr. Krista Blue, I have been working with Ms. [place] for PT for several weeks.  She has reported increased difficulty with handwriting and fine motor skills.  She may benefit from occupational therapy to address this, and pt is in agreement to follow up with you.  If you agree with this, could you please send order for occupational therapy via Epic?  Thank you.  Mady Haagensen, PT 01/30/19 5:37 PM Phone: [phone] Fax: [phone]

## 2019-02-03 ENCOUNTER — Ambulatory Visit: Payer: Medicare Other | Admitting: Physical Therapy

## 2019-02-03 ENCOUNTER — Other Ambulatory Visit: Payer: Self-pay

## 2019-02-03 ENCOUNTER — Encounter: Payer: Self-pay | Admitting: Physical Therapy

## 2019-02-03 DIAGNOSIS — R2681 Unsteadiness on feet: Secondary | ICD-10-CM | POA: Diagnosis not present

## 2019-02-03 DIAGNOSIS — R2689 Other abnormalities of gait and mobility: Secondary | ICD-10-CM

## 2019-02-03 DIAGNOSIS — M6281 Muscle weakness (generalized): Secondary | ICD-10-CM

## 2019-02-03 NOTE — Therapy (Signed)
[facility] [address] [place], [place], [address] Phone: [phone]   Fax:  [phone]  Physical Therapy Treatment  Patient Details  Name: SHEKELIA BOUTIN MRN: [id number] Date of Birth: 25-Nov-1945 Referring Provider (PT): Levert Feinstein, MD   Encounter Date: 02/03/2019  PT End of Session - 02/03/19 2126    Visit Number  5    Number of Visits  9   New episode of care   Date for PT Re-Evaluation  04/02/19    Authorization Type  [employer] Medicare-will need 10th visit progress note    PT Start Time  0934    PT Stop Time  1012    PT Time Calculation (min)  38 min    Activity Tolerance  Patient tolerated treatment well    Behavior During Therapy  [facility] for tasks assessed/performed       Past Medical History:  Diagnosis Date  . Arthritis   . Hyperlipidemia   . Hypertension   . No blood products   . Osteopenia   . PTSD (post-traumatic stress disorder)   . Rapid heart beat    takes metoprolol to treat / denies HBP  . Tinnitus    VERY MILD; DOES NOT AFFECT HEARING     Past Surgical History:  Procedure Laterality Date  . ABDOMINAL HYSTERECTOMY    . BUNIONECTOMY     BILATERAL  . CHOLECYSTECTOMY    . JOINT REPLACEMENT    . TOTAL HIP ARTHROPLASTY Left 11/11/2013   Procedure: LEFT TOTAL HIP ARTHROPLASTY ANTERIOR APPROACH;  Surgeon: Loanne Drilling, MD;  Location: [facility];  Service: Orthopedics;  Laterality: Left;  . TOTAL KNEE ARTHROPLASTY Left 08/16/2014   Procedure: LEFT TOTAL KNEE ARTHROPLASTY;  Surgeon: Ollen Gross, MD;  Location: [facility];  Service: Orthopedics;  Laterality: Left;  . TOTAL KNEE ARTHROPLASTY Right 10/22/2016   Procedure: RIGHT TOTAL KNEE ARTHROPLASTY;  Surgeon: Ollen Gross, MD;  Location: [facility];  Service: Orthopedics;  Laterality: Right;    There were no vitals filed for this visit.  Subjective Assessment - 02/03/19 0935    Subjective  No changes, no falls.  Don't feel like I'm having to pull  as much to stand up from toilet.    Pertinent History  PMH includes arthritis, HTN, osteopenia, PTSD, LTHR, L TKR    Patient Stated Goals  Pt's goal for therapy is to help with stiffness.    Currently in Pain?  No/denies                       [facility] Adult PT Treatment/Exercise - 02/03/19 0001      Transfers   Transfers  Sit to Stand;Stand to Sit    Sit to Stand  5: Supervision;6: Modified independent (Device/Increase time);From chair/3-in-1;Without upper extremity assist    Sit to Stand Details (indicate cue type and reason)  Cues for increased momentum for lower surfaces sit<>stand    Stand to Sit  6: Modified independent (Device/Increase time);Without upper extremity assist;To chair/3-in-1    Number of Reps  Other reps (comment);Other sets (comment)   5 reps:  24", 18", 16" surfaces     Ambulation/Gait   Ambulation/Gait  Yes    Ambulation/Gait Assistance  6: Modified independent (Device/Increase time)    Ambulation Distance (Feet)  345 Feet    Assistive device  None    Gait Pattern  Step-through pattern;Decreased arm swing - right;Decreased arm swing - left;Trunk flexed;Narrow base of support;Decreased trunk rotation  Ambulation Surface  Level;Indoor    Gait Comments  Cues for upright posture, deliberate arm swing and step length      Knee/Hip Exercises: Aerobic   Stepper  Seated Stepper, 4 extremities, LEvel 2.5, x 6 minutes, initial RPM 63, with cues for increased intensity, pt able to keep RPM >85 for 1-2 minutes.  Rates work effort level as 8/10 with increased intensity.        [facility]) - 02/03/19 7564    PWR! exercises  Moves in standing    PWR! Up  x 20 reps    PWR! Rock  x 10 reps each side    PWR! Twist  x 5 reps each side reaching across body in the corner, then PWR! Twist x 8 reps each side    PWR Step  x 10 reps each side   Forward x 10, back x 10 with UE support at chair   Comments  Performed PWR! Moves in standing, with verbal, visual, tactile  cues for technique, amplitude.  PWR! Up for posture, PWR! Rock for [employer]! Twist for trunk rotation, PWR! Step for initial stepping.          PT Education - 02/03/19 2125    Education Details  PWR! Moves in standing added to HEP; disccussed intensity/effort level ratings with aerobic exercise    Person(s) Educated  Patient    Methods  Explanation;Demonstration;Handout    Comprehension  Verbalized understanding;Returned demonstration;Verbal cues required       PT Short Term Goals - 01/29/19 0855      PT SHORT TERM GOAL #1   Title  Pt will be independent with HEP to address Parkinson's-related deficits.  TARGET for STGs, 4 weeks (01/30/2019)-may be delayed due to delayed start (scheduling)    Period  Weeks    Status  On-going    Target Date  01/30/19      PT SHORT TERM GOAL #2   Title  Pt will improve 5x sit<>stand to less than or equal to 13 seconds to demonstrate improved functional strength, ease of transfers.    Baseline  13.05 sec 01/29/2019    Time  4    Period  Weeks    Status  Achieved    Target Date  01/30/19      PT SHORT TERM GOAL #3   Title  Pt will improve MiniBEStest score to at least 22/28 for decreased fall risk.    Time  4    Period  Weeks    Status  Achieved    Target Date  01/30/19      PT SHORT TERM GOAL #4   Title  Pt will verbalize understanding of local Parkinson's disease-related resources.    Time  4    Period  Weeks    Status  Achieved    Target Date  01/30/19        PT Long Term Goals - 01/02/19 0909      PT LONG TERM GOAL #1   Title  Pt will verbalize plans for continued community fitness and optimal PD fitness routine.  TARGET 8 weeks, 02/27/2019    Time  8    Period  Weeks    Status  New    Target Date  02/27/19      PT LONG TERM GOAL #2   Title  Pt will improve 5x sit<>stand to less than or equal to 11.5 seconds for improved transfer efficiency and functional lower extremity strength.  Time  8    Period  Weeks     Status  New    Target Date  02/27/19      PT LONG TERM GOAL #3   Title  Pt will improve TUG and TUG cog to less than/equal to 10% difference, for improved dual tasking with gait.    Time  8    Period  Weeks    Status  New    Target Date  02/27/19      PT LONG TERM GOAL #4   Title  Pt will verbalize understanding of fall prevention in home environment.    Time  8    Period  Weeks    Status  New    Target Date  02/27/19            Plan - 02/03/19 2127    Clinical Impression Statement  Focus of skilled PT session today on aerobic warm-up with instruction in increased intensity/amplitude of movement, then progressed to PWR! Moves in standing (added to HEP today).  Pt reports having overall improvement in low surface transfers from toilet, but still needing occasional UE support.  She continues to require occasional cues for technique for exercises and for increased amplitude/intensity of movement.    Personal Factors and Comorbidities  Comorbidity 3+    Comorbidities  PMH includes arthritis, HTN, osteopenia, PTSD, LTHR, L TKR    Examination-Activity Limitations  Locomotion Level;Transfers    Examination-Participation Restrictions  Community Activity   Community fitness   Stability/Clinical Decision Making  Evolving/Moderate complexity    Rehab Potential  Good    PT Frequency  1x / week   per patient request   PT Duration  8 weeks   plus eval   PT Treatment/Interventions  ADLs/Self Care Home Management;Gait training;Functional mobility training;Therapeutic activities;Therapeutic exercise;Patient/family education;Neuromuscular re-education;Balance training;Manual techniques    PT Next Visit Plan  Review standing PWR! Moves HEP; review sitting PWR! Moves, sit<>stand from progressively lower surfaces.  Work on aerobic activity and walking program for home (stationary bike)    Recommended Other Services  Resent referral request 02/03/2019    Consulted and Agree with Plan of Care   Patient       Patient will benefit from skilled therapeutic intervention in order to improve the following deficits and impairments:  Abnormal gait, Decreased balance, Decreased mobility, Difficulty walking, Decreased strength, Postural dysfunction  Visit Diagnosis: Unsteadiness on feet  Muscle weakness (generalized)  Other abnormalities of gait and mobility     Problem List Patient Active Problem List   Diagnosis Date Noted  . Parkinsonism (H[facility]) 05/26/2018  . Gait abnormality 01/23/2018  . Bradykinesia 01/23/2018  . OA (osteoarthritis) of knee 08/16/2014  . Postoperative anemia due to acute blood loss 11/12/2013  . OA (osteoarthritis) of hip 11/11/2013  . Palpitations 03/11/2012    MARRIOTT,AMY W. 02/03/2019, 9:32 PM  Gean MaidensMARRIOTT,AMY W., PT   [facility] [address] [place], [place], [address] Phone: [phone]   Fax:  [phone]  Name: Tilman Neatrlene C Wenke MRN: [id number] Date of Birth: 04/19/1946

## 2019-02-04 DIAGNOSIS — M6281 Muscle weakness (generalized): Secondary | ICD-10-CM | POA: Insufficient documentation

## 2019-02-04 NOTE — Addendum Note (Signed)
Addended by: Marcial Pacas on: 02/04/2019 09:56 AM   Modules accepted: Orders

## 2019-02-04 NOTE — Telephone Encounter (Signed)
Ordered was placed for occupational therapy

## 2019-02-09 ENCOUNTER — Other Ambulatory Visit: Payer: Self-pay

## 2019-02-09 ENCOUNTER — Ambulatory Visit: Payer: Medicare Other | Admitting: Physical Therapy

## 2019-02-09 ENCOUNTER — Encounter: Payer: Self-pay | Admitting: Physical Therapy

## 2019-02-09 DIAGNOSIS — M6281 Muscle weakness (generalized): Secondary | ICD-10-CM

## 2019-02-09 DIAGNOSIS — R2681 Unsteadiness on feet: Secondary | ICD-10-CM

## 2019-02-09 DIAGNOSIS — R2689 Other abnormalities of gait and mobility: Secondary | ICD-10-CM

## 2019-02-09 NOTE — Therapy (Signed)
[facility] [address] [place], [place], [address] Phone: [phone]   Fax:  [phone]  Physical Therapy Treatment  Patient Details  Name: Kimberly Mccullough MRN: [id number] Date of Birth: 11/19/1945 Referring Provider (PT): Levert FeinsteinYijun Yan, MD   Encounter Date: 02/09/2019  PT End of Session - 02/09/19 0901    Visit Number  6    Number of Visits  9   New episode of care   Date for PT Re-Evaluation  04/02/19    Authorization Type  [employer] Medicare-will need 10th visit progress note    PT Start Time  0848    PT Stop Time  0930    PT Time Calculation (min)  42 min    Activity Tolerance  Patient tolerated treatment well    Behavior During Therapy  [facility] for tasks assessed/performed       Past Medical History:  Diagnosis Date  . Arthritis   . Hyperlipidemia   . Hypertension   . No blood products   . Osteopenia   . PTSD (post-traumatic stress disorder)   . Rapid heart beat    takes metoprolol to treat / denies HBP  . Tinnitus    VERY MILD; DOES NOT AFFECT HEARING     Past Surgical History:  Procedure Laterality Date  . ABDOMINAL HYSTERECTOMY    . BUNIONECTOMY     BILATERAL  . CHOLECYSTECTOMY    . JOINT REPLACEMENT    . TOTAL HIP ARTHROPLASTY Left 11/11/2013   Procedure: LEFT TOTAL HIP ARTHROPLASTY ANTERIOR APPROACH;  Surgeon: Loanne DrillingFrank Aluisio V, MD;  Location: [facility];  Service: Orthopedics;  Laterality: Left;  . TOTAL KNEE ARTHROPLASTY Left 08/16/2014   Procedure: LEFT TOTAL KNEE ARTHROPLASTY;  Surgeon: Ollen GrossFrank Aluisio, MD;  Location: [facility];  Service: Orthopedics;  Laterality: Left;  . TOTAL KNEE ARTHROPLASTY Right 10/22/2016   Procedure: RIGHT TOTAL KNEE ARTHROPLASTY;  Surgeon: Ollen GrossAluisio, Frank, MD;  Location: [facility];  Service: Orthopedics;  Laterality: Right;    There were no vitals filed for this visit.  Subjective Assessment - 02/09/19 0850    Subjective  Denies changes or falls.  Asking about why she is on  Ropinirole.    Pertinent History  PMH includes arthritis, HTN, osteopenia, PTSD, LTHR, L TKR    Patient Stated Goals  Pt's goal for therapy is to help with stiffness.    Currently in Pain?  No/denies           [facility] Adult [facility]/Exercise - 02/09/19 0001      Transfers   Transfers  Sit to Stand;Stand to Sit    Sit to Stand  5: Supervision;6: Modified independent (Device/Increase time);From chair/3-in-1;Without upper extremity assist    Sit to Stand Details (indicate cue type and reason)  cues for momentum    Stand to Sit  6: Modified independent (Device/Increase time);Without upper extremity assist;To chair/3-in-1    Number of Reps  Other reps (comment)   5     Posture/Postural Control   Posture/Postural Control  Postural limitations    Postural Limitations  Rounded Shoulders      Self-Care   Self-Care  Other Self-Care Comments    Other Self-Care Comments   Initiated discussion about optimal community fitness program and importance of exercise.  Pt reports that she hasnt walked lately and has not told any of her friends about her PD diagnosis yet.  Discussed intensity of exercise.       Knee/Hip Exercises: Aerobic   Stepper  Scifit level 2.5 all 4 extremities x 8 minutes with 2 bouts of rpm>80.  Pt having difficulty time maintaining rpm >80 and c/o fatigue with activity.        [facility]) - 02/09/19 1217    PWR! Up  x 20    PWR! Rock  x 20    PWR! Twist  x 20    PWR Step  x 20    Comments  Performed PWR standing at counter for support.  Cues for intensity along with full ROM.  Needs constant cues to keep technique.    PWR! Up  x 20    PWR! Rock  x 20    PWR! Twist  x20    PWR! Step  x20    Comments  Reviewed technique with cues for intensity and proper technique.  Cues for open hands and full ROM.            PT Short Term Goals - 01/29/19 0855      PT SHORT TERM GOAL #1   Title  Pt will be independent with HEP to address Parkinson's-related deficits.   TARGET for STGs, 4 weeks (01/30/2019)-may be delayed due to delayed start (scheduling)    Period  Weeks    Status  On-going    Target Date  01/30/19      PT SHORT TERM GOAL #2   Title  Pt will improve 5x sit<>stand to less than or equal to 13 seconds to demonstrate improved functional strength, ease of transfers.    Baseline  13.05 sec 01/29/2019    Time  4    Period  Weeks    Status  Achieved    Target Date  01/30/19      PT SHORT TERM GOAL #3   Title  Pt will improve MiniBEStest score to at least 22/28 for decreased fall risk.    Time  4    Period  Weeks    Status  Achieved    Target Date  01/30/19      PT SHORT TERM GOAL #4   Title  Pt will verbalize understanding of local Parkinson's disease-related resources.    Time  4    Period  Weeks    Status  Achieved    Target Date  01/30/19        PT Long Term Goals - 01/02/19 0909      PT LONG TERM GOAL #1   Title  Pt will verbalize plans for continued community fitness and optimal PD fitness routine.  TARGET 8 weeks, 02/27/2019    Time  8    Period  Weeks    Status  New    Target Date  02/27/19      PT LONG TERM GOAL #2   Title  Pt will improve 5x sit<>stand to less than or equal to 11.5 seconds for improved transfer efficiency and functional lower extremity strength.    Time  8    Period  Weeks    Status  New    Target Date  02/27/19      PT LONG TERM GOAL #3   Title  Pt will improve TUG and TUG cog to less than/equal to 10% difference, for improved dual tasking with gait.    Time  8    Period  Weeks    Status  New    Target Date  02/27/19      PT LONG TERM GOAL #4   Title  Pt will  verbalize understanding of fall prevention in home environment.    Time  8    Period  Weeks    Status  New    Target Date  02/27/19            Plan - 02/09/19 0901    Clinical Impression Statement  Skilled session focused on aerobic activity for flexibility and endurance along with PWR! moves and transfer training.  Pt  continues to ask questions concerning her symptoms and why medication was prescribed.    Personal Factors and Comorbidities  Comorbidity 3+    Comorbidities  PMH includes arthritis, HTN, osteopenia, PTSD, LTHR, L TKR    Examination-Activity Limitations  Locomotion Level;Transfers    Examination-Participation Restrictions  Community Activity   Community fitness   Stability/Clinical Decision Making  Evolving/Moderate complexity    Rehab Potential  Good    PT Frequency  1x / week   per patient request   PT Duration  8 weeks   plus eval   PT Treatment/Interventions  ADLs/Self Care Home Management;Gait training;Functional mobility training;Therapeutic activities;Therapeutic exercise;Patient/family education;Neuromuscular re-education;Balance training;Manual techniques    PT Next Visit Plan  Walking for intensity and as part of community fitness.  Provide handout and review optimal community fitness.    Consulted and Agree with Plan of Care  Patient       Patient will benefit from skilled therapeutic intervention in order to improve the following deficits and impairments:  Abnormal gait, Decreased balance, Decreased mobility, Difficulty walking, Decreased strength, Postural dysfunction  Visit Diagnosis: Unsteadiness on feet  Muscle weakness (generalized)  Other abnormalities of gait and mobility     Problem List Patient Active Problem List   Diagnosis Date Noted  . Muscle weakness (generalized) 02/04/2019  . Parkinsonism ([place]) 05/26/2018  . Gait abnormality 01/23/2018  . Bradykinesia 01/23/2018  . OA (osteoarthritis) of knee 08/16/2014  . Postoperative anemia due to acute blood loss 11/12/2013  . OA (osteoarthritis) of hip 11/11/2013  . Palpitations 03/11/2012   Narda Bonds, PTA [place] 02/09/19 12:22 PM Phone: [phone] Fax: [facility] [address]  [place], [place], [address] Phone: [phone]   Fax:  [phone]  Name: Kimberly Mccullough MRN: [id number] Date of Birth: 1945/07/25

## 2019-02-18 ENCOUNTER — Other Ambulatory Visit: Payer: Self-pay

## 2019-02-18 ENCOUNTER — Ambulatory Visit: Payer: Medicare Other | Admitting: Physical Therapy

## 2019-02-18 ENCOUNTER — Encounter: Payer: Self-pay | Admitting: Physical Therapy

## 2019-02-18 DIAGNOSIS — R2681 Unsteadiness on feet: Secondary | ICD-10-CM

## 2019-02-18 DIAGNOSIS — R2689 Other abnormalities of gait and mobility: Secondary | ICD-10-CM

## 2019-02-18 NOTE — Patient Instructions (Signed)

## 2019-02-18 NOTE — Therapy (Signed)
[facility] [address] [place], [place], [address] Phone: [phone]   Fax:  [phone]  Physical Therapy Treatment  Patient Details  Name: Kimberly Mccullough MRN: [id number] Date of Birth: 10/18/1945 Referring Provider (PT): Levert Feinstein, MD   Encounter Date: 02/18/2019  PT End of Session - 02/18/19 2056    Visit Number  7    Number of Visits  9   New episode of care   Date for PT Re-Evaluation  04/02/19    Authorization Type  [employer] Medicare-will need 10th visit progress note    PT Start Time  1532    PT Stop Time  1616    PT Time Calculation (min)  44 min    Activity Tolerance  Patient tolerated treatment well    Behavior During Therapy  [facility] for tasks assessed/performed       Past Medical History:  Diagnosis Date  . Arthritis   . Hyperlipidemia   . Hypertension   . No blood products   . Osteopenia   . PTSD (post-traumatic stress disorder)   . Rapid heart beat    takes metoprolol to treat / denies HBP  . Tinnitus    VERY MILD; DOES NOT AFFECT HEARING     Past Surgical History:  Procedure Laterality Date  . ABDOMINAL HYSTERECTOMY    . BUNIONECTOMY     BILATERAL  . CHOLECYSTECTOMY    . JOINT REPLACEMENT    . TOTAL HIP ARTHROPLASTY Left 11/11/2013   Procedure: LEFT TOTAL HIP ARTHROPLASTY ANTERIOR APPROACH;  Surgeon: Loanne Drilling, MD;  Location: [facility];  Service: Orthopedics;  Laterality: Left;  . TOTAL KNEE ARTHROPLASTY Left 08/16/2014   Procedure: LEFT TOTAL KNEE ARTHROPLASTY;  Surgeon: Ollen Gross, MD;  Location: [facility];  Service: Orthopedics;  Laterality: Left;  . TOTAL KNEE ARTHROPLASTY Right 10/22/2016   Procedure: RIGHT TOTAL KNEE ARTHROPLASTY;  Surgeon: Ollen Gross, MD;  Location: [facility];  Service: Orthopedics;  Laterality: Right;    There were no vitals filed for this visit.  Subjective Assessment - 02/18/19 1538    Subjective  I'm just frustrated, because no matter what I do the  disease will progress, right?  No falls.    Pertinent History  PMH includes arthritis, HTN, osteopenia, PTSD, LTHR, L TKR    Patient Stated Goals  Pt's goal for therapy is to help with stiffness.    Currently in Pain?  No/denies                       [facility] Adult PT Treatment/Exercise - 02/18/19 1544      Ambulation/Gait   Ambulation/Gait  Yes    Ambulation/Gait Assistance  6: Modified independent (Device/Increase time)    Ambulation Distance (Feet)  920 Feet    Assistive device  None    Gait Pattern  Step-through pattern;Decreased arm swing - right;Decreased arm swing - left;Trunk flexed;Narrow base of support;Decreased trunk rotation    Gait Comments  Cues for upright posture, deliberate arm swing and step length.  Discussed walking program as addition to HEP and overall fitness program.      Self-Care   Self-Care  Other Self-Care Comments    Other Self-Care Comments   Continued discussion, providing handout about optimal continued fitness upon d/c from PT:  including daily performance of HEP, walking program, and aerobic exercise (reviewed discussion about aerobic exercise and intensity from previous session, as pt has stationary bike at home).  Disucssed  walking at home, at the mall or in the community for additional walking distances as part of walking program.  Pt does not seem completely sure about returning to gym at this time.  Additionally, pt wants to work on bed mobility:  Worked on sit>supine, with cues (and return demo x 5 reps) for rocking thorugh hips to swing BLEs onto mat together, with pt noting improved ease of getting legs onto mat.      Knee/Hip Exercises: Standing   Heel Raises  Both;1 set;10 reps    Heel Raises Limitations  Toe raises 10 reps      Knee/Hip Exercises: Seated   Long Arc Quad  Strengthening;Right;Left;1 set;10 reps    Marching  Strengthening;Right;Left;1 set;10 reps    Marching Limitations  SEated stepping out and in x 10 reps     Cues  for intensity, with seated exercises.   [facility]) - 02/18/19 2053    PWR! exercises  Moves in standing    Basic 4 Flow  PWR! Moves Flow, x 4 reps, with PT providing visual and verbal cues for technique.      Comments  Attempted supine PWR! Moves, as pt requests more exercises initially; then she does not want to do them.          PT Education - 02/18/19 2055    Education Details  Optimal fitness program post d/c from  PT    Person(s) Educated  Patient    Methods  Explanation;Demonstration;Handout    Comprehension  Verbalized understanding   with additional questions throughout      PT Short Term Goals - 01/29/19 0855      PT SHORT TERM GOAL #1   Title  Pt will be independent with HEP to address Parkinson's-related deficits.  TARGET for STGs, 4 weeks (01/30/2019)-may be delayed due to delayed start (scheduling)    Period  Weeks    Status  On-going    Target Date  01/30/19      PT SHORT TERM GOAL #2   Title  Pt will improve 5x sit<>stand to less than or equal to 13 seconds to demonstrate improved functional strength, ease of transfers.    Baseline  13.05 sec 01/29/2019    Time  4    Period  Weeks    Status  Achieved    Target Date  01/30/19      PT SHORT TERM GOAL #3   Title  Pt will improve MiniBEStest score to at least 22/28 for decreased fall risk.    Time  4    Period  Weeks    Status  Achieved    Target Date  01/30/19      PT SHORT TERM GOAL #4   Title  Pt will verbalize understanding of local Parkinson's disease-related resources.    Time  4    Period  Weeks    Status  Achieved    Target Date  01/30/19        PT Long Term Goals - 01/02/19 0909      PT LONG TERM GOAL #1   Title  Pt will verbalize plans for continued community fitness and optimal PD fitness routine.  TARGET 8 weeks, 02/27/2019    Time  8    Period  Weeks    Status  New    Target Date  02/27/19      PT LONG TERM GOAL #2   Title  Pt will improve 5x sit<>stand to less than or equal  to  11.5 seconds for improved transfer efficiency and functional lower extremity strength.    Time  8    Period  Weeks    Status  New    Target Date  02/27/19      PT LONG TERM GOAL #3   Title  Pt will improve TUG and TUG cog to less than/equal to 10% difference, for improved dual tasking with gait.    Time  8    Period  Weeks    Status  New    Target Date  02/27/19      PT LONG TERM GOAL #4   Title  Pt will verbalize understanding of fall prevention in home environment.    Time  8    Period  Weeks    Status  New    Target Date  02/27/19            Plan - 02/18/19 2056    Clinical Impression Statement  Pt has multiple questions throughout session today about education provided about components of optimal fitness program and exercises she should continue to perform.  At one point, she requests more exercises, then when PT ready to perform supine PWR! Moves, pt declines and does not want to perform.  Pt overall seems that she is moving well in therapy, with occasional cues for increased intensity of movement patterns.  Pt likely ready for d/c next week.    Personal Factors and Comorbidities  Comorbidity 3+    Comorbidities  PMH includes arthritis, HTN, osteopenia, PTSD, LTHR, L TKR    Examination-Activity Limitations  Locomotion Level;Transfers    Examination-Participation Restrictions  Community Activity   Community fitness   Stability/Clinical Decision Making  Evolving/Moderate complexity    Rehab Potential  Good    PT Frequency  1x / week   per patient request   PT Duration  8 weeks   plus eval   PT Treatment/Interventions  ADLs/Self Care Home Management;Gait training;Functional mobility training;Therapeutic activities;Therapeutic exercise;Patient/family education;Neuromuscular re-education;Balance training;Manual techniques    PT Next Visit Plan  Review education provided on optimal fitness; has she tried PWR! Flow in standing?  Check LTGs and plan for d/c next visit.     Consulted and Agree with Plan of Care  Patient       Patient will benefit from skilled therapeutic intervention in order to improve the following deficits and impairments:  Abnormal gait, Decreased balance, Decreased mobility, Difficulty walking, Decreased strength, Postural dysfunction  Visit Diagnosis: Other abnormalities of gait and mobility  Unsteadiness on feet     Problem List Patient Active Problem List   Diagnosis Date Noted  . Muscle weakness (generalized) 02/04/2019  . Parkinsonism (HCC) 05/26/2018  . Gait abnormality 01/23/2018  . Bradykinesia 01/23/2018  . OA (osteoarthritis) of knee 08/16/2014  . Postoperative anemia due to acute blood loss 11/12/2013  . OA (osteoarthritis) of hip 11/11/2013  . Palpitations 03/11/2012    Nori Poland W. 02/18/2019, 9:00 PM Gean MaidensMARRIOTT,Sutton Hirsch W., PT  [facility] [address] [place], [place], [address] Phone: [phone]   Fax:  [phone]  Name: Kimberly Mccullough MRN: [id number] Date of Birth: 01-18-1946

## 2019-02-25 ENCOUNTER — Other Ambulatory Visit: Payer: Self-pay

## 2019-02-25 ENCOUNTER — Encounter: Payer: Self-pay | Admitting: Physical Therapy

## 2019-02-25 ENCOUNTER — Ambulatory Visit: Payer: Medicare Other | Admitting: Physical Therapy

## 2019-02-25 DIAGNOSIS — R2681 Unsteadiness on feet: Secondary | ICD-10-CM | POA: Diagnosis not present

## 2019-02-25 DIAGNOSIS — R2689 Other abnormalities of gait and mobility: Secondary | ICD-10-CM

## 2019-02-25 NOTE — Patient Instructions (Signed)
   Timed Up and Go Shuttle:  From a chair in your home, STAND UP, then walk to another chair about 10-15 ft away, TURN and SIT DOWN  STAND UP, then walk back to the initial seat, TURN and SIT DOWN.  REPEAT this sequence 5-10 times, as a means for aerobic activity.

## 2019-02-25 NOTE — Therapy (Signed)
[facility] [address] [place], [place], [address] Phone: [phone]   Fax:  [phone]  Physical Therapy Treatment  Patient Details  Name: Kimberly Mccullough MRN: [id number] Date of Birth: 11/18/1945 Referring Provider (PT): Marcial Pacas, MD   Encounter Date: 02/25/2019  PT End of Session - 02/25/19 2141    Visit Number  8    Number of Visits  9   New episode of care   Date for PT Re-Evaluation  04/02/19    Authorization Type  [employer] Medicare-will need 10th visit progress note    PT Start Time  1536    PT Stop Time  1612    PT Time Calculation (min)  36 min    Activity Tolerance  Patient tolerated treatment well    Behavior During Therapy  [facility] for tasks assessed/performed       Past Medical History:  Diagnosis Date  . Arthritis   . Hyperlipidemia   . Hypertension   . No blood products   . Osteopenia   . PTSD (post-traumatic stress disorder)   . Rapid heart beat    takes metoprolol to treat / denies HBP  . Tinnitus    VERY MILD; DOES NOT AFFECT HEARING     Past Surgical History:  Procedure Laterality Date  . ABDOMINAL HYSTERECTOMY    . BUNIONECTOMY     BILATERAL  . CHOLECYSTECTOMY    . JOINT REPLACEMENT    . TOTAL HIP ARTHROPLASTY Left 11/11/2013   Procedure: LEFT TOTAL HIP ARTHROPLASTY ANTERIOR APPROACH;  Surgeon: Gearlean Alf, MD;  Location: [facility];  Service: Orthopedics;  Laterality: Left;  . TOTAL KNEE ARTHROPLASTY Left 08/16/2014   Procedure: LEFT TOTAL KNEE ARTHROPLASTY;  Surgeon: Gaynelle Arabian, MD;  Location: [facility];  Service: Orthopedics;  Laterality: Left;  . TOTAL KNEE ARTHROPLASTY Right 10/22/2016   Procedure: RIGHT TOTAL KNEE ARTHROPLASTY;  Surgeon: Gaynelle Arabian, MD;  Location: [facility];  Service: Orthopedics;  Laterality: Right;    There were no vitals filed for this visit.  Subjective Assessment - 02/25/19 1538    Subjective  No changes since last visit.    Pertinent History  PMH  includes arthritis, HTN, osteopenia, PTSD, LTHR, L TKR    Patient Stated Goals  Pt's goal for therapy is to help with stiffness.    Currently in Pain?  No/denies                       [facility] Adult PT Treatment/Exercise - 02/25/19 0001      Transfers   Transfers  Sit to Stand;Stand to Sit    Sit to Stand  6: Modified independent (Device/Increase time);With upper extremity assist;Without upper extremity assist;From chair/3-in-1    Five time sit to stand comments   11.16   with UE support; 14.59 no UE support   Stand to Sit  6: Modified independent (Device/Increase time);With upper extremity assist;Without upper extremity assist;To chair/3-in-1      Ambulation/Gait   Ambulation/Gait  Yes    Ambulation/Gait Assistance  6: Modified independent (Device/Increase time)    Ambulation Distance (Feet)  700 Feet   in 3 min walk   Assistive device  None    Gait Pattern  Step-through pattern;Trunk flexed;Narrow base of support;Decreased trunk rotation    Ambulation Surface  Level;Indoor    Gait velocity  8.35 sec = 3.93 ft/sec      Timed Up and Go Test   TUG  Normal  TUG;Cognitive TUG    Normal TUG (seconds)  10.22    Cognitive TUG (seconds)  13.35   2nd attempt:  12.38 sec     Self-Care   Self-Care  Other Self-Care Comments    Other Self-Care Comments   Discussed progress towards goals, pt's fitness plan; provided pt with additional online Zoom fitness options through [employer] [place].  Discussed plans for d/c, including return PT evals or PD screens.  Pt agrees to screens in 4-6 months for PT, OT, speech.             PT Education - 02/25/19 2140    Education Details  POC, plans for d/c, additional fitness options through [employer] [place]  [facility]); (emailed to pt, with her permission)    Person(s) Educated  Patient    Methods  Explanation   emailed info on Zoom fitness options from [employer] [facility]  Verbalized  understanding       PT Short Term Goals - 01/29/19 0855      PT SHORT TERM GOAL #1   Title  Pt will be independent with HEP to address Parkinson's-related deficits.  TARGET for STGs, 4 weeks (01/30/2019)-may be delayed due to delayed start (scheduling)    Period  Weeks    Status  On-going    Target Date  01/30/19      PT SHORT TERM GOAL #2   Title  Pt will improve 5x sit<>stand to less than or equal to 13 seconds to demonstrate improved functional strength, ease of transfers.    Baseline  13.05 sec 01/29/2019    Time  4    Period  Weeks    Status  Achieved    Target Date  01/30/19      PT SHORT TERM GOAL #3   Title  Pt will improve MiniBEStest score to at least 22/28 for decreased fall risk.    Time  4    Period  Weeks    Status  Achieved    Target Date  01/30/19      PT SHORT TERM GOAL #4   Title  Pt will verbalize understanding of local Parkinson's disease-related resources.    Time  4    Period  Weeks    Status  Achieved    Target Date  01/30/19        PT Long Term Goals - 02/25/19 1548      PT LONG TERM GOAL #1   Title  Pt will verbalize plans for continued community fitness and optimal PD fitness routine.  TARGET 8 weeks, 02/27/2019    Time  8    Period  Weeks    Status  Achieved      PT LONG TERM GOAL #2   Title  Pt will improve 5x sit<>stand to less than or equal to 11.5 seconds for improved transfer efficiency and functional lower extremity strength.    Baseline  11.16 seconds    Time  8    Period  Weeks    Status  Achieved      PT LONG TERM GOAL #3   Title  Pt will improve TUG and TUG cog to less than/equal to 10% difference, for improved dual tasking with gait.    Time  8    Period  Weeks    Status  Not Met      PT LONG TERM GOAL #4   Title  Pt will verbalize understanding of fall prevention  in home environment.    Time  8    Period  Weeks    Status  Deferred            Plan - 02/25/19 2142    Clinical Impression Statement  Assessed LTGs  this visit, with pt meeting LTG 1 and 2 (optimal fitness program and improved 5x sit<>stand score).  LTG 3 not met for TUG cognitive/TUG, as difference in score is >10% difference.  Discussed how this can impact dual tasking or distractions with gait.  LTG 4 deferred, not formally addressed.  During the course of therapy, she has improved functional strength and balance.  Pt is apporpriate for d/c at this time.    Personal Factors and Comorbidities  Comorbidity 3+    Comorbidities  PMH includes arthritis, HTN, osteopenia, PTSD, LTHR, L TKR    Examination-Activity Limitations  Locomotion Level;Transfers    Examination-Participation Restrictions  Community Activity   Community fitness   Stability/Clinical Decision Making  Evolving/Moderate complexity    Rehab Potential  Good    PT Frequency  1x / week   per patient request   PT Duration  8 weeks   plus eval   PT Treatment/Interventions  ADLs/Self Care Home Management;Gait training;Functional mobility training;Therapeutic activities;Therapeutic exercise;Patient/family education;Neuromuscular re-education;Balance training;Manual techniques    PT Next Visit Plan  D/C PT this visit.  Plan for return screens in 6 months.    Consulted and Agree with Plan of Care  Patient       Patient will benefit from skilled therapeutic intervention in order to improve the following deficits and impairments:  Abnormal gait, Decreased balance, Decreased mobility, Difficulty walking, Decreased strength, Postural dysfunction  Visit Diagnosis: Other abnormalities of gait and mobility     Problem List Patient Active Problem List   Diagnosis Date Noted  . Muscle weakness (generalized) 02/04/2019  . Parkinsonism ([place]) 05/26/2018  . Gait abnormality 01/23/2018  . Bradykinesia 01/23/2018  . OA (osteoarthritis) of knee 08/16/2014  . Postoperative anemia due to acute blood loss 11/12/2013  . OA (osteoarthritis) of hip 11/11/2013  . Palpitations 03/11/2012     , W. 02/25/2019, 9:48 PM  Mady Haagensen, PT 02/25/19 9:51 PM Phone: [phone] Fax: [place] [facility] [address] [place], [place], [address] Phone: [phone]   Fax:  [phone]  Name: Kimberly Mccullough MRN: [id number] Date of Birth: 1945-10-28   PHYSICAL THERAPY DISCHARGE SUMMARY  Visits from Start of Care: 8  Current functional level related to goals / functional outcomes: PT Long Term Goals - 02/25/19 1548      PT LONG TERM GOAL #1   Title  Pt will verbalize plans for continued community fitness and optimal PD fitness routine.  TARGET 8 weeks, 02/27/2019    Time  8    Period  Weeks    Status  Achieved      PT LONG TERM GOAL #2   Title  Pt will improve 5x sit<>stand to less than or equal to 11.5 seconds for improved transfer efficiency and functional lower extremity strength.    Baseline  11.16 seconds    Time  8    Period  Weeks    Status  Achieved      PT LONG TERM GOAL #3   Title  Pt will improve TUG and TUG cog to less than/equal to 10% difference, for improved dual tasking with gait.    Time  8    Period  Weeks    Status  Not Met      PT LONG TERM GOAL #4   Title  Pt will verbalize understanding of fall prevention in home environment.    Time  8    Period  Weeks    Status  Deferred         Remaining deficits: Posture, high level balance   Education / Equipment: Educated in [employer], optimal fitness program, PD resources.  Plan: Patient agrees to discharge.  Patient goals were partially met. Patient is being discharged due to being pleased with the current functional level.  ?????  Recommend PD screens in 4-6 months due to progressive nature of disease process.     Mady Haagensen, PT 02/25/19 9:53 PM Phone: [phone] Fax: [phone]

## 2019-03-12 ENCOUNTER — Other Ambulatory Visit: Payer: Self-pay | Admitting: Nurse Practitioner

## 2019-04-06 ENCOUNTER — Other Ambulatory Visit: Payer: Self-pay | Admitting: Neurology

## 2019-04-15 NOTE — Progress Notes (Signed)
CARDIOLOGY OFFICE NOTE  Date:  04/20/2019    Kimberly Mccullough Date of Birth: Apr 12, 1946 Medical Record #[id number]  PCP:  Kimberly Lucks, PA-C  Cardiologist:  Kimberly Mccullough  Chief Complaint  Patient presents with  . Follow-up    1 year check - seen for Dr. Angelena Mccullough    History of Present Illness: Kimberly Mccullough is a 73 y.o. female who presents today for a 14 month check.  Seen for Dr. Angelena Mccullough.   She has a history of HLD, HTN, PTSD, & palpitations.  She had negative cardiac monitors in 2013 and 2017. She is not known to have CAD. She is a Sales promotion account executive witness. Stress echo in 2014 without ischemia. Exercise stress test in 2017 without ischemic EKG changes. She has tended to have chronic anxieties over past event surrounding her husband's death - she has declined psyche help.   Last seen by me in October of 2019 - felt like she was doing ok. There was concern she may have Parkinsons. Very nervous/anxious.    The patient does not have symptoms concerning for COVID-19 infection (fever, chills, cough, or new shortness of breath).   Comes in today. Here alone. She tells me she does indeed have Parkinson's. She is on Requip. She has a tremor. She is very depressed and uneasy over this diagnosis and what her future holds. Does not check her BP at home. No chest pain. No real palpitations. No recent labs in over a year that I can see. She is trying to do about 3000 steps in her house. No falls.   Past Medical History:  Diagnosis Date  . Arthritis   . Hyperlipidemia   . Hypertension   . No blood products   . Osteopenia   . PTSD (post-traumatic stress disorder)   . Rapid heart beat    takes metoprolol to treat / denies HBP  . Tinnitus    VERY MILD; DOES NOT AFFECT HEARING     Past Surgical History:  Procedure Laterality Date  . ABDOMINAL HYSTERECTOMY    . BUNIONECTOMY     BILATERAL  . CHOLECYSTECTOMY    . JOINT REPLACEMENT    . TOTAL HIP  ARTHROPLASTY Left 11/11/2013   Procedure: LEFT TOTAL HIP ARTHROPLASTY ANTERIOR APPROACH;  Surgeon: Kimberly Alf, MD;  Location: [facility];  Service: Orthopedics;  Laterality: Left;  . TOTAL KNEE ARTHROPLASTY Left 08/16/2014   Procedure: LEFT TOTAL KNEE ARTHROPLASTY;  Surgeon: Kimberly Arabian, MD;  Location: [facility];  Service: Orthopedics;  Laterality: Left;  . TOTAL KNEE ARTHROPLASTY Right 10/22/2016   Procedure: RIGHT TOTAL KNEE ARTHROPLASTY;  Surgeon: Kimberly Arabian, MD;  Location: [facility];  Service: Orthopedics;  Laterality: Right;     Medications: Current Meds  Medication Sig  . alendronate (FOSAMAX) 70 MG tablet Take by mouth.  Marland Kitchen aspirin 81 MG tablet Take 81 mg by mouth daily.  Marland Kitchen BIOTIN FORTE PO Take by mouth.  . FIBER ADULT GUMMIES PO Take 2 tablets by mouth daily.  . Glucosamine-Chondroit-Vit C-Mn (GLUCOSAMINE 1500 COMPLEX) CAPS Take by mouth.  . meloxicam (MOBIC) 15 MG tablet Take 15 mg by mouth daily.  . Multiple Vitamins-Minerals (MULTIVITAMIN ADULTS PO) Take by mouth.  . Omega-3 Fatty Acids (FISH OIL) 1000 MG CAPS Take by mouth.  Marland Kitchen omeprazole (PRILOSEC) 40 MG capsule   . Probiotic Product (PROBIOTIC PO) Take 1 tablet by mouth 2 (two) times a day.  Marland Kitchen rOPINIRole (REQUIP) 0.25 MG tablet TAKE 1 TABLET BY  MOUTH 3  TIMES DAILY  . verapamil (CALAN-SR) 120 MG CR tablet TAKE 1 TABLET BY MOUTH  TWICE DAILY  . vitamin E (VITAMIN E) 400 UNIT capsule Take 400 Units by mouth daily.     Allergies: Allergies  Allergen Reactions  . Statins Other (See Comments)    myalgia  . Other     Pt is Jehovas Witness and does not want any blood products Pt is Jehovas Witness and does not want any blood products    Social History: The patient  reports that she has never smoked. She has never used smokeless tobacco. She reports that she does not drink alcohol or use drugs.   Family History: The patient's family history includes Dementia in her mother; Liver disease in her father.   Review of  Systems: Please see the history of present illness.   All other systems are reviewed and negative.   Physical Exam: VS:  BP (!) 162/108   Pulse 88   Ht 5\' 10"  (1.778 m)   Wt 164 lb 6.4 oz (74.6 kg)   BMI 23.59 kg/m  .  BMI Body mass index is 23.59 kg/m.  Wt Readings from Last 3 Encounters:  04/20/19 164 lb 6.4 oz (74.6 kg)  12/01/18 163 lb (73.9 kg)  05/26/18 159 lb (72.1 kg)    General: Alert. She is in no acute distress. Some tremor noted. Anxious.   HEENT: Normal.  Neck: Supple, no JVD, carotid bruits, or masses noted.  Cardiac: Regular rate and rhythm. No murmurs, rubs, or gallops. No edema.  Respiratory:  Lungs are clear to auscultation bilaterally with normal work of breathing.  GI: Soft and nontender.  MS: No deformity or atrophy. Gait and ROM intact.  Skin: Warm and dry. Color is normal.  Neuro:  Strength and sensation are intact and no gross focal deficits noted.  Psych: Alert, appropriate and with normal affect.   LABORATORY DATA:  EKG:  EKG is ordered today. This demonstrates NSR.  Lab Results  Component Value Date   WBC 8.3 10/24/2016   HGB 10.9 (L) 10/24/2016   HCT 31.3 (L) 10/24/2016   PLT 196 10/24/2016   GLUCOSE 111 (H) 10/24/2016   CHOL 222 (H) 11/14/2012   TRIG 61.0 11/14/2012   HDL 53.70 11/14/2012   LDLDIRECT 167.1 11/14/2012   ALT 19 10/16/2016   AST 26 10/16/2016   NA 138 10/24/2016   K 3.9 10/24/2016   CL 108 10/24/2016   CREATININE 0.73 10/24/2016   BUN 23 (H) 10/24/2016   CO2 25 10/24/2016   TSH 1.63 10/29/2013   INR 0.93 10/16/2016     BNP (last 3 results) No results for input(s): BNP in the last 8760 hours.  ProBNP (last 3 results) No results for input(s): PROBNP in the last 8760 hours.   Other Studies Reviewed Today:  Holter Study Highlights 2017  Sinus rhythm with sinus pauses. Longest pause is 2.0 seconds.  Frequent premature atrial contractions Rare premature ventricular contractions  If she is having dizziness  or near syncope, would need to hold Verapamil.    GXT Study Highlights 2017   There was no ST segment deviation noted during stress.  Blood pressure demonstrated a hypertensive response to exercise.  Negative exercise treadmill test at an adequate workload and heart rate achieved.      Assessment/Plan:  1. HTN - BP by me is down to 150/80 - on very little medicine - now with Parkinson's I think we need to stay with  the current course due to possible labile HTN. She does not wish to be on more medicine at this time as well.   2. Palpitations - not really noted today.   3. Parkinson's - she is on Requip.   4. Anxiety/PTSD - seems worse to me. - she does not feel like she needs counseling services. This diagnosis of Parkinson's seems to have heightened this.   5. Health maintenance - labs today. High dose flu shot today.   6. COVID-19 Education: The signs and symptoms of COVID-19 were discussed with the patient and how to seek care for testing (follow up with PCP or arrange E-visit).  The importance of social distancing, staying at home, hand hygiene and wearing a mask when out in public were discussed today.  Current medicines are reviewed with the patient today.  The patient does not have concerns regarding medicines other than what has been noted above.  The following changes have been made:  See above.  Labs/ tests ordered today include:    Orders Placed This Encounter  Procedures  . Basic metabolic panel  . CBC no Diff  . Hepatic function panel  . TSH  . Lipid Profile  . EKG 12-Lead     Disposition:   FU with me in one year.    Patient is agreeable to this plan and will call if any problems develop in the interim.   SignedNorma Fredrickson, NP  04/20/2019 2:11 PM  [facility] [address] [place], [place]  [address] Phone: [phone] Fax: [phone]

## 2019-04-20 ENCOUNTER — Encounter

## 2019-04-20 ENCOUNTER — Ambulatory Visit: Payer: Medicare Other | Admitting: Nurse Practitioner

## 2019-04-20 ENCOUNTER — Encounter: Payer: Self-pay | Admitting: Nurse Practitioner

## 2019-04-20 ENCOUNTER — Other Ambulatory Visit: Payer: Self-pay

## 2019-04-20 VITALS — BP 162/108 | HR 88 | Ht 70.0 in | Wt 164.4 lb

## 2019-04-20 DIAGNOSIS — R002 Palpitations: Secondary | ICD-10-CM

## 2019-04-20 DIAGNOSIS — I10 Essential (primary) hypertension: Secondary | ICD-10-CM

## 2019-04-20 DIAGNOSIS — Z23 Encounter for immunization: Secondary | ICD-10-CM | POA: Diagnosis not present

## 2019-04-20 NOTE — Patient Instructions (Addendum)
After Visit Summary:  We will be checking the following labs today - BMET, CBC, HPF, Lipids and TSH   Medication Instructions:    Continue with your current medicines.  We will give you a flu shot today   If you need a refill on your cardiac medications before your next appointment, please call your pharmacy.     Testing/Procedures To Be Arranged:  N/A  Follow-Up:   See me in one year.     At [facility], you and your health needs are our priority.  As part of our continuing mission to provide you with exceptional heart care, we have created designated Provider Care Teams.  These Care Teams include your primary Cardiologist (physician) and Advanced Practice Providers (APPs -  Physician Assistants and Nurse Practitioners) who all work together to provide you with the care you need, when you need it.  Special Instructions:  . Stay safe, stay home, wash your hands for at least 20 seconds and wear a mask when out in public.  . It was good to talk with you today.    Call the [place] office at [phone] if you have any questions, problems or concerns.

## 2019-04-21 ENCOUNTER — Telehealth: Payer: Self-pay | Admitting: *Deleted

## 2019-04-21 LAB — CBC
Hematocrit: 43.2 % (ref 34.0–46.6)
Hemoglobin: 14.9 g/dL (ref 11.1–15.9)
MCH: 29.8 pg (ref 26.6–33.0)
MCHC: 34.5 g/dL (ref 31.5–35.7)
MCV: 86 fL (ref 79–97)
Platelets: 241 10*3/uL (ref 150–450)
RBC: 5 x10E6/uL (ref 3.77–5.28)
RDW: 12.8 % (ref 11.7–15.4)
WBC: 4.9 10*3/uL (ref 3.4–10.8)

## 2019-04-21 LAB — BASIC METABOLIC PANEL
BUN/Creatinine Ratio: 23 (ref 12–28)
BUN: 20 mg/dL (ref 8–27)
CO2: 22 mmol/L (ref 20–29)
Calcium: 9.4 mg/dL (ref 8.7–10.3)
Chloride: 106 mmol/L (ref 96–106)
Creatinine, Ser: 0.86 mg/dL (ref 0.57–1.00)
GFR calc Af Amer: 78 mL/min/{1.73_m2} (ref >59)
GFR calc non Af Amer: 68 mL/min/{1.73_m2} (ref >59)
Glucose: 108 mg/dL — ABNORMAL HIGH (ref 65–99)
Potassium: 3.9 mmol/L (ref 3.5–5.2)
Sodium: 143 mmol/L (ref 134–144)

## 2019-04-21 LAB — TSH: TSH: 1.51 u[IU]/mL (ref 0.450–4.500)

## 2019-04-21 LAB — LIPID PANEL
Chol/HDL Ratio: 3.5 ratio (ref 0.0–4.4)
Cholesterol, Total: 257 mg/dL — ABNORMAL HIGH (ref 100–199)
HDL: 73 mg/dL (ref >39)
LDL Chol Calc (NIH): 167 mg/dL — ABNORMAL HIGH (ref 0–99)
Triglycerides: 97 mg/dL (ref 0–149)
VLDL Cholesterol Cal: 17 mg/dL (ref 5–40)

## 2019-04-21 LAB — HEPATIC FUNCTION PANEL
ALT: 20 IU/L (ref 0–32)
AST: 25 IU/L (ref 0–40)
Albumin: 4.8 g/dL — ABNORMAL HIGH (ref 3.7–4.7)
Alkaline Phosphatase: 64 IU/L (ref 39–117)
Bilirubin Total: 0.2 mg/dL (ref 0.0–1.2)
Bilirubin, Direct: 0.09 mg/dL (ref 0.00–0.40)
Total Protein: 7.1 g/dL (ref 6.0–8.5)

## 2019-04-21 NOTE — Telephone Encounter (Signed)
Pt calling in to discuss labs.  All questions were answered.

## 2019-05-14 ENCOUNTER — Other Ambulatory Visit: Payer: Self-pay | Admitting: Nurse Practitioner

## 2019-06-03 ENCOUNTER — Other Ambulatory Visit: Payer: Self-pay

## 2019-06-03 ENCOUNTER — Ambulatory Visit: Payer: Medicare Other | Admitting: Neurology

## 2019-06-03 ENCOUNTER — Encounter: Payer: Self-pay | Admitting: Neurology

## 2019-06-03 VITALS — BP 130/74 | HR 88 | Temp 97.5°F | Ht 70.0 in | Wt 167.8 lb

## 2019-06-03 DIAGNOSIS — G20 Parkinson's disease: Secondary | ICD-10-CM

## 2019-06-03 MED ORDER — CARBIDOPA-LEVODOPA 25-100 MG PO TABS: ORAL_TABLET | ORAL | 5 refills | Status: DC

## 2019-06-03 NOTE — Patient Instructions (Addendum)
Start taking Sinemet 25-100 mg, take 1/2 tablet at 8 am, 1/2 tablet at 12 pm, 1 tablet at 6 pm, start by taking with food to decrease nausea initially   Stop the Requip   Continue exercise, walking, check into "[employer]" Exercise Program locally for PD  Return in 4 months   Carbidopa; Levodopa tablets What is this medicine? CARBIDOPA;LEVODOPA (kar bi DOE pa; lee voe DOE pa) is used to treat the symptoms of Parkinson's disease. This medicine may be used for other purposes; ask your health care provider or pharmacist if you have questions. COMMON BRAND NAME(S): Atamet, SINEMET What should I tell my health care provider before I take this medicine? They need to know if you have any of these conditions:  depression or other mental illness  diabetes  glaucoma  heart disease, including history of a heart attack  history of irregular heartbeat  kidney disease  liver disease  lung or breathing disease, like asthma  narcolepsy  sleep apnea  stomach or intestine problems  an unusual or allergic reaction to levodopa, carbidopa, other medicines, foods, dyes, or preservatives  pregnant or trying to get pregnant  breast-feeding How should I use this medicine? Take this medicine by mouth with a glass of water. Follow the directions on the prescription label. Take your doses at regular intervals. Do not take your medicine more often than directed. Do not stop taking except on the advice of your doctor or health care professional. Talk to your pediatrician regarding the use of this medicine in children. Special care may be needed. Overdosage: If you think you have taken too much of this medicine contact a poison control center or emergency room at once. NOTE: This medicine is only for you. Do not share this medicine with others. What if I miss a dose? If you miss a dose, take it as soon as you can. If it is almost time for your next dose, take only that dose. Do not take double or  extra doses. What may interact with this medicine? Do not take this medicine with any of the following medications:  MAOIs like Marplan, Nardil, and Parnate  reserpine  tetrabenazine This medicine may also interact with the following medications:  alcohol  droperidol  entacapone  iron supplements or multivitamins with iron  isoniazid, INH  linezolid  medicines for depression, anxiety, or psychotic disturbances  medicines for high blood pressure  medicines for sleep  metoclopramide  papaverine  procarbazine  tedizolid  rasagiline  selegiline  tolcapone This list may not describe all possible interactions. Give your health care provider a list of all the medicines, herbs, non-prescription drugs, or dietary supplements you use. Also tell them if you smoke, drink alcohol, or use illegal drugs. Some items may interact with your medicine. What should I watch for while using this medicine? Visit your health care professional for regular checks on your progress. Tell your health care professional if your symptoms do not start to get better or if they get worse. Do not stop taking except on your health care professional's advice. You may develop a severe reaction. Your health care professional will tell you how much medicine to take. You may experience a wearing of effect prior to the time for your next dose of this medicine. You may also experience an on-off effect where the medicine apparently stops working for anything from a minute to several hours, then suddenly starts working again. Tell your doctor or health care professional if any of  these symptoms happen to you. Your dose may need to be changed. A high protein diet can slow or prevent absorption of this medicine. Avoid high protein foods near the time of taking this medicine to help to prevent these problems. Take this medicine at least 30 minutes before eating or one hour after meals. You may want to eat higher protein  foods later in the day or in small amounts. Discuss your diet with your doctor or health care professional or nutritionist. You may get drowsy or dizzy. Do not drive, use machinery, or do anything that needs mental alertness until you know how this drug affects you. Do not stand or sit up quickly, especially if you are an older patient. This reduces the risk of dizzy or fainting spells. Alcohol may interfere with the effect of this medicine. Avoid alcoholic drinks. When taking this medicine, you may fall asleep without notice. You may be doing activities like driving a car, talking, or eating. You may not feel drowsy before it happens. Contact your health care provider right away if this happens to you. There have been reports of increased sexual urges or other strong urges such as gambling while taking this medicine. If you experience any of these while taking this medicine, you should report this to your health care provider as soon as possible. If you are diabetic, this medicine may interfere with the accuracy of some tests for sugar or ketones in the urine (does not interfere with blood tests). Check with your doctor or health care professional before changing the dose of your diabetic medicine. This medicine may discolor the urine or sweat, making it look darker or red in color. This is of no cause for concern. However, this may stain clothing or fabrics. This medicine may cause a decrease in vitamin B6. You should make sure that you get enough vitamin B6 while you are taking this medicine. Discuss the foods you eat and the vitamins you take with your health care professional. What side effects may I notice from receiving this medicine? Side effects that you should report to your doctor or health care professional as soon as possible:  allergic reactions like skin rash, itching or hives, swelling of the face, lips, or tongue  changes in emotions or moods  falling asleep during normal activities  like driving  fast, irregular heartbeat  feeling faint or lightheaded, falls  fever  hallucinations  new or increased gambling urges, sexual urges, uncontrolled spending, binge or compulsive eating, or other urges  stomach pain  trouble passing urine or change in the amount of urine  uncontrollable movements of the arms, face, head, mouth, neck, or upper body Side effects that usually do not require medical attention (report to your doctor or health care professional if they continue or are bothersome):  dizziness  headache  loss of appetite  nausea  trouble sleeping This list may not describe all possible side effects. Call your doctor for medical advice about side effects. You may report side effects to FDA at 1-800-FDA-1088. Where should I keep my medicine? Keep out of the reach of children. Store at room temperature between 15 and 30 degrees C (59 and 86 degrees F). Protect from light. Throw away any unused medicine after the expiration date. NOTE: This sheet is a summary. It may not cover all possible information. If you have questions about this medicine, talk to your doctor, pharmacist, or health care provider.  2020 Elsevier/Gold Standard (2018-12-22 19:49:59)

## 2019-06-03 NOTE — Progress Notes (Signed)
PATIENT: Kimberly Mccullough DOB: [birth year]  REASON FOR VISIT: follow up HISTORY FROM: patient  HISTORY OF PRESENT ILLNESS: Today 06/03/19  HISTORY of  Kimberly Mccullough is a 74 year old female, seen in request by her primary care PA Kimberly Mccullough, for evaluation of decreased sense of smell, concerning about Parkinson's disease, initial evaluation was on January 23, 2018.  I have reviewed and summarized the referring note from the referring physician.  She had past medical history of hypertension, status post bilateral knee replacement, left hip replacement.  She lives alone since her husband passed away in 09/08/2010  She noticed gradual onset loss of sense of smell for the past couple years, was noted by her son that she has mild difficulty clear her foot from the floor sometimes, she denies significant tremor, mild unsteady gait she contributed to her joints replacement.  She also was noted to be acting out of dreams, she exercise regularly, she is Jehovah's Witness,  visiting people's house regularly  Laboratory evaluation in September 2019: Normal CMP, creatinine of 0.88, total cholesterol was 223, TSH 2.9  UPDATE May 26 2018: She has intermittent body twitching, she does exercise regularly, 3 times a week, continue has intermittent head shaking,          I personally reviewed MRI brain in October 2019, mild generalized atrophy, supratentorium small vessel disease, there was no acute abnormality.                                                   UPDATE December 01 2018: She noticed slow worsening gait abnormality, left hand shaking, she was not able to exercise regularly due to COVID-19  Update June 03, 2019 SS: After last visit, she was prescribed Azilect, but was too expensive, instead try Requip 0.25 mg 3 times a day to help with her tremor and slow movement. She does not think the Requip has been helpful, has been having hallucinations during the night, but she  knows they are not real.  For the last several weeks has noted stiffness in the evening around 8 PM, slow movement.  She has noticed that her handwriting is getting smaller.  She has to push off when she stands.  She has resting tremor in the right hand.  She completed physical therapy after last visit, it was beneficial.  She does exercise by walking, using a stationary bike.  She has not had any falls.  She presents today for follow-up unaccompanied.  REVIEW OF SYSTEMS: Out of a complete 14 system review of symptoms, the patient complains only of the following symptoms, and all other reviewed systems are negative.  Tremor, gait abnormality  ALLERGIES: Allergies  Allergen Reactions  . Statins Other (See Comments)    myalgia  . Other     Pt is Jehovas Witness and does not want any blood products Pt is Jehovas Witness and does not want any blood products    HOME MEDICATIONS: Outpatient Medications Prior to Visit  Medication Sig Dispense Refill  . alendronate (FOSAMAX) 70 MG tablet Take by mouth.    Marland Kitchen aspirin 81 MG tablet Take 81 mg by mouth daily.    Marland Kitchen BIOTIN FORTE PO Take by mouth.    . FIBER ADULT GUMMIES PO Take 2 tablets by mouth daily.    . Glucosamine-Chondroit-Vit C-Mn (GLUCOSAMINE 1500  COMPLEX) CAPS Take by mouth.    . meloxicam (MOBIC) 15 MG tablet Take 15 mg by mouth daily.    . Multiple Vitamins-Minerals (MULTIVITAMIN ADULTS PO) Take by mouth.    . nitrofurantoin, macrocrystal-monohydrate, (MACROBID) 100 MG capsule Take 100 mg by mouth 2 (two) times daily.    . Omega-3 Fatty Acids (FISH OIL) 1000 MG CAPS Take by mouth.    Marland Kitchen omeprazole (PRILOSEC) 40 MG capsule     . Probiotic Product (PROBIOTIC PO) Take 1 tablet by mouth 2 (two) times a day.    Marland Kitchen rOPINIRole (REQUIP) 0.25 MG tablet TAKE 1 TABLET BY MOUTH 3  TIMES DAILY 270 tablet 3  . verapamil (CALAN-SR) 120 MG CR tablet TAKE 1 TABLET BY MOUTH  TWICE DAILY 180 tablet 3  . vitamin E (VITAMIN E) 400 UNIT capsule Take 400 Units  by mouth daily.     No facility-administered medications prior to visit.    PAST MEDICAL HISTORY: Past Medical History:  Diagnosis Date  . Arthritis   . Hyperlipidemia   . Hypertension   . No blood products   . Osteopenia   . PTSD (post-traumatic stress disorder)   . Rapid heart beat    takes metoprolol to treat / denies HBP  . Tinnitus    VERY MILD; DOES NOT AFFECT HEARING     PAST SURGICAL HISTORY: Past Surgical History:  Procedure Laterality Date  . ABDOMINAL HYSTERECTOMY    . BUNIONECTOMY     BILATERAL  . CHOLECYSTECTOMY    . JOINT REPLACEMENT    . TOTAL HIP ARTHROPLASTY Left 11/11/2013   Procedure: LEFT TOTAL HIP ARTHROPLASTY ANTERIOR APPROACH;  Surgeon: Loanne Drilling, MD;  Location: [facility];  Service: Orthopedics;  Laterality: Left;  . TOTAL KNEE ARTHROPLASTY Left 08/16/2014   Procedure: LEFT TOTAL KNEE ARTHROPLASTY;  Surgeon: Ollen Gross, MD;  Location: [facility];  Service: Orthopedics;  Laterality: Left;  . TOTAL KNEE ARTHROPLASTY Right 10/22/2016   Procedure: RIGHT TOTAL KNEE ARTHROPLASTY;  Surgeon: Ollen Gross, MD;  Location: [facility];  Service: Orthopedics;  Laterality: Right;    FAMILY HISTORY: Family History  Problem Relation Age of Onset  . Dementia Mother   . Liver disease Father   . CAD Neg Hx     SOCIAL HISTORY: Social History   Socioeconomic History  . Marital status: Widowed    Spouse name: Not on file  . Number of children: 3  . Years of education: Not on file  . Highest education level: Not on file  Occupational History  . Occupation: Cytogeneticist: [employer]  Tobacco Use  . Smoking status: Never Smoker  . Smokeless tobacco: Never Used  Substance and Sexual Activity  . Alcohol use: No  . Drug use: No  . Sexual activity: Not Currently  Other Topics Concern  . Not on file  Social History Narrative  . Not on file   Social Determinants of Health   Financial Resource Strain:   . Difficulty of Paying Living Expenses:  Not on file  Food Insecurity:   . Worried About Programme researcher, broadcasting/film/video in the Last Year: Not on file  . Ran Out of Food in the Last Year: Not on file  Transportation Needs:   . Lack of Transportation (Medical): Not on file  . Lack of Transportation (Non-Medical): Not on file  Physical Activity:   . Days of Exercise per Week: Not on file  . Minutes of Exercise per Session: Not on  file  Stress:   . Feeling of Stress : Not on file  Social Connections:   . Frequency of Communication with Friends and Family: Not on file  . Frequency of Social Gatherings with Friends and Family: Not on file  . Attends Religious Services: Not on file  . Active Member of Clubs or Organizations: Not on file  . Attends Banker Meetings: Not on file  . Marital Status: Not on file  Intimate Partner Violence:   . Fear of Current or Ex-Partner: Not on file  . Emotionally Abused: Not on file  . Physically Abused: Not on file  . Sexually Abused: Not on file   PHYSICAL EXAM  Vitals:   06/03/19 1259  BP: 130/74  Pulse: 88  Temp: (!) 97.5 F (36.4 C)  TempSrc: Oral  Weight: 167 lb 12.8 oz (76.1 kg)  Height: 5\' 10"  (1.778 m)   Body mass index is 24.08 kg/m.  Generalized: Well developed, in no acute distress   Neurological examination  Mentation: Alert oriented to time, place, history taking. Follows all commands speech and language fluent, mild masking of the face is seen Cranial nerve II-XII: Pupils were equal round reactive to light. Extraocular movements were full, visual field were full on confrontational test. Facial sensation and strength were normal. Head turning and shoulder shrug  were normal and symmetric. Motor: The motor testing reveals 5 over 5 strength of all 4 extremities. Good symmetric motor tone is noted throughout.  Noted no resting tremor.  Mild rigidity of the right wrist, loss of rhythm with left foot tapping Sensory: Sensory testing is intact to soft touch on all 4  extremities. No evidence of extinction is noted.  Coordination: Cerebellar testing reveals good finger-nose-finger and heel-to-shin bilaterally.  Mild tremor to the right hand with finger-nose-finger. Gait and station: She has to push off from seated position, no decreased arm swing, good stride, mild difficulty turning Reflexes: Deep tendon reflexes are symmetric and normal bilaterally.   DIAGNOSTIC DATA (LABS, IMAGING, TESTING) - I reviewed patient records, labs, notes, testing and imaging myself where available.  Lab Results  Component Value Date   WBC 4.9 04/20/2019   HGB 14.9 04/20/2019   HCT 43.2 04/20/2019   MCV 86 04/20/2019   PLT 241 04/20/2019      Component Value Date/Time   NA 143 04/20/2019 1435   K 3.9 04/20/2019 1435   CL 106 04/20/2019 1435   CO2 22 04/20/2019 1435   GLUCOSE 108 (H) 04/20/2019 1435   GLUCOSE 111 (H) 10/24/2016 0417   BUN 20 04/20/2019 1435   CREATININE 0.86 04/20/2019 1435   CALCIUM 9.4 04/20/2019 1435   PROT 7.1 04/20/2019 1435   ALBUMIN 4.8 (H) 04/20/2019 1435   AST 25 04/20/2019 1435   ALT 20 04/20/2019 1435   ALKPHOS 64 04/20/2019 1435   BILITOT 0.2 04/20/2019 1435   GFRNONAA 68 04/20/2019 1435   GFRAA 78 04/20/2019 1435   Lab Results  Component Value Date   CHOL 257 (H) 04/20/2019   HDL 73 04/20/2019   LDLCALC 167 (H) 04/20/2019   LDLDIRECT 167.1 11/14/2012   TRIG 97 04/20/2019   CHOLHDL 3.5 04/20/2019   No results found for: HGBA1C No results found for: VITAMINB12 Lab Results  Component Value Date   TSH 1.510 04/20/2019   ASSESSMENT AND PLAN 74 y.o. year old female  has a past medical history of Arthritis, Hyperlipidemia, Hypertension, No blood products, Osteopenia, PTSD (post-traumatic stress disorder), Rapid heart beat, and  Tinnitus. here with:  1.  Idiopathic Parkinson's disease -Has reported stiffness in the evening, gait abnormality, hallucinations during the night with Requip, micrographia, mild masking of the face is  seen -Start Sinemet 25-100 mg, 1/2 tablet at 8 AM, 12 PM, 1 tablet at bedtime -When starting Sinemet, stop Requip, due to side effect -MRI of the brain showed mild generalized atrophy, mild supratentorial small vessel disease -Continue moderate exercise, check into [facility] exercise program for Parkinson's -Follow-up in 4 months or sooner if needed  I spent 15 minutes with the patient. 50% of this time was spent discussing her plan of care.   Margie Ege, AGNP-C, DNP 06/03/2019, 1:20 PM [facility] [address], [address] [place], [place] [address] [phone]

## 2019-07-01 NOTE — Progress Notes (Signed)
I have reviewed and agreed above plan. 

## 2019-08-27 ENCOUNTER — Ambulatory Visit: Payer: Medicare Other | Attending: Neurology

## 2019-08-27 ENCOUNTER — Ambulatory Visit: Payer: Medicare Other | Admitting: Occupational Therapy

## 2019-08-27 ENCOUNTER — Other Ambulatory Visit: Payer: Self-pay | Admitting: Neurology

## 2019-08-27 ENCOUNTER — Telehealth: Payer: Self-pay | Admitting: Neurology

## 2019-08-27 ENCOUNTER — Ambulatory Visit: Payer: Medicare Other | Admitting: Physical Therapy

## 2019-08-27 ENCOUNTER — Other Ambulatory Visit: Payer: Self-pay

## 2019-08-27 DIAGNOSIS — R29818 Other symptoms and signs involving the nervous system: Secondary | ICD-10-CM

## 2019-08-27 DIAGNOSIS — R471 Dysarthria and anarthria: Secondary | ICD-10-CM

## 2019-08-27 DIAGNOSIS — R2689 Other abnormalities of gait and mobility: Secondary | ICD-10-CM

## 2019-08-27 DIAGNOSIS — G20 Parkinson's disease: Secondary | ICD-10-CM

## 2019-08-27 NOTE — Therapy (Signed)
[facility] [address] [place], [place], [address] Phone: [phone]   Fax:  [phone]  Patient Details  Name: Kimberly Mccullough MRN: [id number] Date of Birth: July 17, 1945 Referring Provider:  Levert Feinstein, MD  Encounter Date: 08/27/2019  Physical Therapy Parkinson's Disease Screen   Timed Up and Go test:10.69- compared to 10.16 sec  10 meter walk test: 7.78 sec (4.42 ft/sec)-compared to 3.9 ft/sec  5 time sit to stand test:11.69 sec no UE support-compared to 14.59 sec no support    Patient does not require Physical Therapy services at this time.  Recommend Physical Therapy screen in 6-9 months.     Venicia Vandall W. 08/27/2019, 10:21 AM  Lonia Blood, PT 08/27/19 10:42 AM Phone: [phone] Fax: [phone]    [facility] [address] [place], [place], [address] Phone: [phone]   Fax:  [phone]

## 2019-08-27 NOTE — Telephone Encounter (Signed)
Received a note from her OT, as she was seen for PD screen, feels she could benefit from OT referral. I will place the order.

## 2019-08-27 NOTE — Therapy (Signed)
[facility] [address] [place], [place], [address] Phone: [phone]   Fax:  [phone]  Patient Details  Name: Kimberly Mccullough MRN: [id number] Date of Birth: 24-Sep-1945 Referring Provider:  Levert Feinstein, MD  Encounter Date: 08/27/2019  .Occupational Therapy Parkinson's Disease Screen  Hand dominance:  RUE    9-hole peg test:    RUE  34.09 sec        LUE  28.12  sec  Box & Blocks Test:   RUE  46 blocks        LUE  52 blocks    Change in ability to perform ADLs/IADLs:  Difficulty with buttons, micrographia at times with handwriting  Other Comments:    Pt would benefit from occupational therapy evaluation due to  Change in coordination and ADL performance   Handsome Anglin 08/27/2019, 10:38 AM Keene Breath, OTR/L [phone] Phone: [phone] 1:44 PM 08/27/19 [facility] [address] [place], [place], [address] Phone: [phone]   Fax:  [phone]

## 2019-08-27 NOTE — Therapy (Signed)
[facility] [address] [place], [place], [address] Phone: [phone]   Fax:  [phone]  Patient Details  Name: Kimberly Mccullough MRN: [id number] Date of Birth: [birth year] Referring Provider:  Levert Feinstein, MD  Encounter Date: 08/27/2019  Speech Therapy Parkinson's Disease Screen   Decibel Level today: upper 60s-low 70s dB  (WNL=70-72 dB) with sound level meter 30cm away from pt's mouth, masked. Pt's conversational volume is WNL at this time.  Pt does not report difficulty in swallowing warranting further evaluation.  Pt does does not require speech therapy services at this time. Recommend ST screen in another 6 months    [facility] ,MS, CCC-SLP  08/27/2019, 10:39 AM  [facility] [address] [place], [place], [address] Phone: [phone]   Fax:  [phone]

## 2019-08-28 ENCOUNTER — Telehealth: Payer: Self-pay | Admitting: Neurology

## 2019-08-28 NOTE — Telephone Encounter (Signed)
Pt is asking for a call to discuss if Maralyn Sago, NP is ok with her taking  Methamucil, please call

## 2019-08-31 NOTE — Telephone Encounter (Signed)
I called pt and relayed that per SS. She is ok to use metamucil, hydration, exercise, fiber are good things to assist with constipation.  Miralax is another option.  She will try using once daily and may increase as needed. She will calll back for quesitons.

## 2019-08-31 NOTE — Telephone Encounter (Signed)
I spoke to pt.  She has issue with constipation, tried kiwi but is no longer wanting to eat those on a continual basis.   I will ask, did not think a issue. Best taking off time of sinemet??

## 2019-08-31 NOTE — Telephone Encounter (Signed)
It is okay for her to take Metamucil. Taking Sinemet with high protein food, can interfere with absorption. A probiotic may also be helpful. Miralax may be helpful, she should make sure increase water, fiber, exercise.

## 2019-09-16 ENCOUNTER — Other Ambulatory Visit: Payer: Self-pay

## 2019-09-16 ENCOUNTER — Ambulatory Visit: Payer: Medicare Other | Attending: Neurology | Admitting: Occupational Therapy

## 2019-09-16 ENCOUNTER — Encounter: Payer: Self-pay | Admitting: Occupational Therapy

## 2019-09-16 DIAGNOSIS — R278 Other lack of coordination: Secondary | ICD-10-CM | POA: Diagnosis present

## 2019-09-16 DIAGNOSIS — M6281 Muscle weakness (generalized): Secondary | ICD-10-CM

## 2019-09-16 DIAGNOSIS — R29818 Other symptoms and signs involving the nervous system: Secondary | ICD-10-CM | POA: Insufficient documentation

## 2019-09-16 DIAGNOSIS — R2689 Other abnormalities of gait and mobility: Secondary | ICD-10-CM | POA: Insufficient documentation

## 2019-09-16 DIAGNOSIS — R2681 Unsteadiness on feet: Secondary | ICD-10-CM | POA: Insufficient documentation

## 2019-09-16 DIAGNOSIS — R41844 Frontal lobe and executive function deficit: Secondary | ICD-10-CM | POA: Diagnosis present

## 2019-09-17 NOTE — Therapy (Signed)
[facility] [address] [place], [place], [address] Phone: [phone]   Fax:  [phone]  Occupational Therapy Evaluation  Patient Details  Name: Kimberly Mccullough MRN: [id number] Date of Birth: [birth year] Referring Provider (OT): Dr. Terrace Arabia   Encounter Date: 09/16/2019  OT End of Session - 09/16/19 1157    Visit Number  1    Number of Visits  5    Authorization Type  [employer] Medicare    Authorization Time Period  Pt only wants to be seen for 4 visits due to financial concerns, check to see if this is 4 treatments, or if it includes eval    Authorization - Visit Number  1    Authorization - Number of Visits  10    OT Start Time  1156    OT Stop Time  1235    OT Time Calculation (min)  39 min    Activity Tolerance  Patient tolerated treatment well    Behavior During Therapy  [facility] for tasks assessed/performed       Past Medical History:  Diagnosis Date  . Arthritis   . Hyperlipidemia   . Hypertension   . No blood products   . Osteopenia   . PTSD (post-traumatic stress disorder)   . Rapid heart beat    takes metoprolol to treat / denies HBP  . Tinnitus    VERY MILD; DOES NOT AFFECT HEARING     Past Surgical History:  Procedure Laterality Date  . ABDOMINAL HYSTERECTOMY    . BUNIONECTOMY     BILATERAL  . CHOLECYSTECTOMY    . JOINT REPLACEMENT    . TOTAL HIP ARTHROPLASTY Left 11/11/2013   Procedure: LEFT TOTAL HIP ARTHROPLASTY ANTERIOR APPROACH;  Surgeon: Loanne Drilling, MD;  Location: [facility];  Service: Orthopedics;  Laterality: Left;  . TOTAL KNEE ARTHROPLASTY Left 08/16/2014   Procedure: LEFT TOTAL KNEE ARTHROPLASTY;  Surgeon: Ollen Gross, MD;  Location: [facility];  Service: Orthopedics;  Laterality: Left;  . TOTAL KNEE ARTHROPLASTY Right 10/22/2016   Procedure: RIGHT TOTAL KNEE ARTHROPLASTY;  Surgeon: Ollen Gross, MD;  Location: [facility];  Service: Orthopedics;  Laterality: Right;    There were no vitals  filed for this visit.  Subjective Assessment - 09/17/19 1323    Subjective   Pt reports she wants to improve the movment of her hands    Pertinent History  Parkinson's disease, CTS RUE  per pt, and arthritis    Patient Stated Goals  improve the movement of hands    Currently in Pain?  No/denies        [facility] OT Assessment - 09/17/19 0001      Assessment   Medical Diagnosis  Parkinson's disease    Referring Provider (OT)  Dr. Terrace Arabia    Onset Date/Surgical Date  08/27/19   referral date     Precautions   Precautions  Fall      Balance Screen   Has the patient fallen in the past 6 months  No    Has the patient had a decrease in activity level because of a fear of falling?   No    Is the patient reluctant to leave their home because of a fear of falling?   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      ADL   Eating/Feeding  Modified independent    Grooming  Independent    Psychologist, occupational independent    Lower Body Bathing  Modified independent    Upper Body Dressing  --   has button hook, difficulty with fasteners at times   Lower Body Dressing  Modified independent    Toilet Transfer  Modified independent    Tub/Shower Transfer  Modified independent   walk in shower   ADL comments  difficulty opening peel back cans, occaisional difficulty with buttons, trouble with sit- stand, difficulty changing sheets., increased time with LB dressing      IADL   Shopping  Takes care of all shopping needs independently    Light Housekeeping  Maintains house alone or with occasional assistance    Meal Prep  Able to complete simple warm meal prep    Medication Management  Is responsible for taking medication in correct dosages at correct time    Financial Management  Manages financial  matters independently (budgets, writes checks, pays rent, bills goes to bank), collects and keeps track of income      Mobility   Mobility Status  Independent      Written Expression   Dominant Hand  Right    Handwriting  100% legible   moderate micrographia     Cognition   Overall Cognitive Status  Impaired/Different from baseline    Memory  Impaired    Memory Impairment  Decreased short term memory    Cognition Comments  Slowed processing speed      Observation/Other Assessments   Other Surveys   Select    Physical Performance Test    Yes    Simulated Eating Time (seconds)  13.53    Donning Doffing Jacket Time (seconds)  11.13 secs    Donning Doffing Jacket Comments  3 button/ unbutton 19.47 secs      Coordination   Fine Motor Movements are Fluid and Coordinated  No    9 Hole Peg Test  Right;Left    Right 9 Hole Peg Test  29.97    Left 9 Hole Peg Test  30.12    Box and Blocks  RUE 51 blocks, LUE 55 blocks    Tremors  bilateral UE's    Coordination  impaired bilaterally      ROM / Strength   AROM / PROM / Strength  AROM      AROM   Overall AROM   Deficits    Overall AROM Comments  RUE and LUE shoulder flexion 135, elbow extension RUE -20, LUE -25                           OT Long Term Goals - 09/17/19 1338      OT LONG TERM GOAL #1   Title  I with PD specific HEP.    Time  5    Period  Weeks    Status  New      OT LONG TERM GOAL #2   Title  Pt will verbalize understanding of adapted strategies for ADLS/IADLS to maximize safety and indpendence(difficulty opening peel cans, changing sheets, difficulty with LB dressing)    Time  5    Period  Weeks    Status  New      OT LONG TERM GOAL #  3   Title  Pt will verbalize understanding of compensations for short term memory deficits and ways to keep thinking skills sharp.    Time  5    Period  Weeks    Status  New      OT LONG TERM GOAL #4   Title  Pt will demonstrate ability to retrieve items  from an overhead shelf with -15 elbow extension for RUE, and -20 elbow ext for LUE.    Baseline  RUE -20, LUE -25    Time  5    Period  Weeks    Status  New      OT LONG TERM GOAL #5   Title  Pt will demonstrate ability to write a short paragraph with 100% legibility and only min decrease in letter size.    Time  5    Period  Weeks    Status  New      OT LONG TERM GOAL #6   Title  Pt will verbalize understanding of awys to prevent future PD related complications and appropriate community resources prn.    Time  5    Period  Weeks    Status  New            Plan - 09/17/19 1343    Clinical Impression Statement  Pt is a 74 year old female who [facility] to [facility], with dx of Parkinson's disease, and decline in fine motor coordination/ ADL perfromance.  She presents with decreased balance, postural instability, abnormal posture, bradykinesia, rigidity,  coordination, cognitve deficits which impedes perfromance of ADLS/ IADLs. Pt can benefit from skilled occupational therapy to maximize safety and I with daily activities. PMH  arthritis, HTN, osteopenia, PTSD, LTHR, L TKR    OT Occupational Profile and History  Problem Focused Assessment - Including review of records relating to presenting problem    Occupational performance deficits (Please refer to evaluation for details):  ADL's;IADL's;Play;Leisure;Social Participation    Body Structure / Function / Physical Skills  ADL;Endurance;UE functional use;Balance;Flexibility;FMC;ROM;Gait;GMC;Decreased knowledge of precautions;IADL;Dexterity;Mobility;Tone;Strength    Cognitive Skills  Attention;Memory;Problem Solve;Safety Awareness;Thought;Understand    Rehab Potential  Good    Clinical Decision Making  Limited treatment options, no task modification necessary    Comorbidities Affecting Occupational Performance:  May have comorbidities impacting occupational performance    Modification or Assistance to Complete Evaluation    No modification of tasks or assist necessary to complete eval    OT Frequency  1x / week    OT Duration  4 weeks   plus eval   OT Treatment/Interventions  Self-care/ADL training;Ultrasound;Energy conservation;Patient/family education;DME and/or AE instruction;Aquatic Therapy;Paraffin;Passive range of motion;Fluidtherapy;Functional Mobility Training;Moist Heat;Therapeutic exercise;Manual Therapy;Therapeutic activities;Cognitive remediation/compensation;Neuromuscular education    Plan  initate coordination HEP, ADL strategies- pt is only being seen for approx 4 weeks    Consulted and Agree with Plan of Care  Patient       Patient will benefit from skilled therapeutic intervention in order to improve the following deficits and impairments:   Body Structure / Function / Physical Skills: ADL, Endurance, UE functional use, Balance, Flexibility, FMC, ROM, Gait, GMC, Decreased knowledge of precautions, IADL, Dexterity, Mobility, Tone, Strength Cognitive Skills: Attention, Memory, Problem Solve, Safety Awareness, Thought, Understand     Visit Diagnosis: Other symptoms and signs involving the nervous system - Plan: Ot plan of care cert/re-cert  Other abnormalities of gait and mobility - Plan: Ot plan of care cert/re-cert  Unsteadiness on feet - Plan: Ot plan of  care cert/re-cert  Muscle weakness (generalized) - Plan: Ot plan of care cert/re-cert  Other lack of coordination - Plan: Ot plan of care cert/re-cert  Frontal lobe and executive function deficit - Plan: Ot plan of care cert/re-cert    Problem List Patient Active Problem List   Diagnosis Date Noted  . Muscle weakness (generalized) 02/04/2019  . Parkinsonism ([place]) 05/26/2018  . Gait abnormality 01/23/2018  . Bradykinesia 01/23/2018  . OA (osteoarthritis) of knee 08/16/2014  . Postoperative anemia due to acute blood loss 11/12/2013  . OA (osteoarthritis) of hip 11/11/2013  . Palpitations 03/11/2012    Spence Soberano 09/17/2019,  1:49 PM Theone Murdoch, OTR/L [phone] Phone: [phone] 1:49 PM 09/17/19 [place] [address] [place], [place], [address] Phone: [phone]   Fax:  [phone]  Name: ADISON REIFSTECK MRN: [id number] Date of Birth: 01-Apr-1946

## 2019-09-22 ENCOUNTER — Ambulatory Visit: Payer: Medicare Other | Admitting: Occupational Therapy

## 2019-09-22 ENCOUNTER — Encounter: Payer: Self-pay | Admitting: Occupational Therapy

## 2019-09-22 ENCOUNTER — Other Ambulatory Visit: Payer: Self-pay

## 2019-09-22 DIAGNOSIS — R41844 Frontal lobe and executive function deficit: Secondary | ICD-10-CM

## 2019-09-22 DIAGNOSIS — R2681 Unsteadiness on feet: Secondary | ICD-10-CM

## 2019-09-22 DIAGNOSIS — R2689 Other abnormalities of gait and mobility: Secondary | ICD-10-CM

## 2019-09-22 DIAGNOSIS — R29818 Other symptoms and signs involving the nervous system: Secondary | ICD-10-CM | POA: Diagnosis not present

## 2019-09-22 DIAGNOSIS — R278 Other lack of coordination: Secondary | ICD-10-CM

## 2019-09-22 NOTE — Patient Instructions (Signed)
   PWR! Hand Exercises  Then, start with elbows bent and hands closed: PWR! Hands: Push hands out BIG. Elbows straight, wrists up, fingers open and spread apart BIG.   PWR! Step: Touch index finger to thumb while keeping other fingers straight. Flick fingers out BIG (thumb out/straighten fingers). Repeat with other fingers. (Step your thumb to each finger).   With arms stretched out in front of you (elbows straight), perform the following:  PWR! Rock:  Move wrists up and down [employer]! Twist: Twist palms up and down BIG  ** Make each movement big and deliberate so that you feel the movement.  Perform at least 10 repetitions 1x/day, but perform PWR! Hands throughout the day when you are having trouble using your hands (picking up/manipulating small objects, writing, eating, typing, sewing, buttoning, etc.).    Coordination Exercises  Perform the following exercises for 20 minutes 1 times per day. Perform with both hand(s). Perform using big movements.   Flipping Cards: Place deck of cards on the table. Flip cards over by opening your hand big to grasp and then turn your palm up big, opening hand fully to release (including thumb).  Deal cards: Hold 1/2 or whole deck in your hand. Use thumb to push card off top of deck with one big push.  Rotate ball with fingertips: Pick up with fingers/thumb and move as much as you can with each turn/movement (clockwise and counter-clockwise).  Toss ball from one hand to the other: Toss big/high.  Deliberately open with toss and deliberately close hand after catch.  Toss ball in the air and catch with the same hand: Toss big/high.  Deliberately open with toss and deliberately close hand after catch.  Pick up coins and stack one at a time: Pick up with big, intentional movements. Do not drag coin to the edge. (5-10 in a stack)  Pick up 5-10 coins one at a time and hold in palm. Then, move coins from palm to fingertips one at time and place in coin  bank/container.  Practice writing: Slow down, write big, and focus on forming each letter.  Practice typing.

## 2019-09-22 NOTE — Therapy (Signed)
[place] [address] [place], [place], [address] Phone: [phone]   Fax:  [phone]  Occupational Therapy Treatment  Patient Details  Name: Kimberly Mccullough MRN: [id number] Date of Birth: 09/24/1945 Referring Provider (OT): Dr. Krista Blue   Encounter Date: 09/22/2019  OT End of Session - 09/22/19 1059    Visit Number  2    Number of Visits  5    Authorization Type  [employer] Medicare    Authorization Time Period  Pt only wants to be seen for 4 visits due to financial concerns, check to see if this is 4 treatments, or if it includes eval    Authorization - Visit Number  2    Authorization - Number of Visits  10    OT Start Time  1022    OT Stop Time  1103    OT Time Calculation (min)  41 min    Activity Tolerance  Patient tolerated treatment well    Behavior During Therapy  [facility] for tasks assessed/performed       Past Medical History:  Diagnosis Date  . Arthritis   . Hyperlipidemia   . Hypertension   . No blood products   . Osteopenia   . PTSD (post-traumatic stress disorder)   . Rapid heart beat    takes metoprolol to treat / denies HBP  . Tinnitus    VERY MILD; DOES NOT AFFECT HEARING     Past Surgical History:  Procedure Laterality Date  . ABDOMINAL HYSTERECTOMY    . BUNIONECTOMY     BILATERAL  . CHOLECYSTECTOMY    . JOINT REPLACEMENT    . TOTAL HIP ARTHROPLASTY Left 11/11/2013   Procedure: LEFT TOTAL HIP ARTHROPLASTY ANTERIOR APPROACH;  Surgeon: Gearlean Alf, MD;  Location: [facility];  Service: Orthopedics;  Laterality: Left;  . TOTAL KNEE ARTHROPLASTY Left 08/16/2014   Procedure: LEFT TOTAL KNEE ARTHROPLASTY;  Surgeon: Gaynelle Arabian, MD;  Location: [facility];  Service: Orthopedics;  Laterality: Left;  . TOTAL KNEE ARTHROPLASTY Right 10/22/2016   Procedure: RIGHT TOTAL KNEE ARTHROPLASTY;  Surgeon: Gaynelle Arabian, MD;  Location: [facility];  Service: Orthopedics;  Laterality: Right;    There were no vitals  filed for this visit.  Subjective Assessment - 09/22/19 1025    Subjective   I'm suprised that I am having trouble with my right hand    Pertinent History  Parkinson's disease, CTS RUE  per pt, and arthritis    Patient Stated Goals  improve the movement of hands    Currently in Pain?  No/denies              OT Education - 09/22/19 1506    Education Details  PWR! hands (basic 4); Coordination HEP--see pt instructions (cueing for incr movement amplitude).  Recommended pt perform both PWR! moves in sitting and standing--issued previously by PT    Person(s) Educated  Patient    Methods  Explanation;Demonstration;Verbal cues;Handout    Comprehension  Returned demonstration;Verbalized understanding;Verbal cues required          OT Long Term Goals - 09/17/19 1338      OT LONG TERM GOAL #1   Title  I with PD specific HEP.    Time  5    Period  Weeks    Status  New      OT LONG TERM GOAL #2   Title  Pt will verbalize understanding of adapted strategies for ADLS/IADLS to maximize safety and indpendence(difficulty opening  peel cans, changing sheets, difficulty with LB dressing)    Time  5    Period  Weeks    Status  New      OT LONG TERM GOAL #3   Title  Pt will verbalize understanding of compensations for short term memory deficits and ways to keep thinking skills sharp.    Time  5    Period  Weeks    Status  New      OT LONG TERM GOAL #4   Title  Pt will demonstrate ability to retrieve items from an overhead shelf with -15 elbow extension for RUE, and -20 elbow ext for LUE.    Baseline  RUE -20, LUE -25    Time  5    Period  Weeks    Status  New      OT LONG TERM GOAL #5   Title  Pt will demonstrate ability to write a short paragraph with 100% legibility and only min decrease in letter size.    Time  5    Period  Weeks    Status  New      OT LONG TERM GOAL #6   Title  Pt will verbalize understanding of awys to prevent future PD related complications and  appropriate community resources prn.    Time  5    Period  Weeks    Status  New            Plan - 09/22/19 1100    Clinical Impression Statement  Pt verbalized understanding of initial coordination and PWR! hands HEP; however, noted pt consistently needs cueing for incr movement amplitude.    OT Occupational Profile and History  Problem Focused Assessment - Including review of records relating to presenting problem    Occupational performance deficits (Please refer to evaluation for details):  ADL's;IADL's;Play;Leisure;Social Participation    Body Structure / Function / Physical Skills  ADL;Endurance;UE functional use;Balance;Flexibility;FMC;ROM;Gait;GMC;Decreased knowledge of precautions;IADL;Dexterity;Mobility;Tone;Strength    Cognitive Skills  Attention;Memory;Problem Solve;Safety Awareness;Thought;Understand    Rehab Potential  Good    Clinical Decision Making  Limited treatment options, no task modification necessary    Comorbidities Affecting Occupational Performance:  May have comorbidities impacting occupational performance    Modification or Assistance to Complete Evaluation   No modification of tasks or assist necessary to complete eval    OT Frequency  1x / week    OT Duration  4 weeks   plus eval   OT Treatment/Interventions  Self-care/ADL training;Ultrasound;Energy conservation;Patient/family education;DME and/or AE instruction;Aquatic Therapy;Paraffin;Passive range of motion;Fluidtherapy;Functional Mobility Training;Moist Heat;Therapeutic exercise;Manual Therapy;Therapeutic activities;Cognitive remediation/compensation;Neuromuscular education    Plan  ADL strategies (review HEP prn), ways to prevent future complications- pt is only being seen for approx 4 weeks    Consulted and Agree with Plan of Care  Patient       Patient will benefit from skilled therapeutic intervention in order to improve the following deficits and impairments:   Body Structure / Function / Physical  Skills: ADL, Endurance, UE functional use, Balance, Flexibility, FMC, ROM, Gait, GMC, Decreased knowledge of precautions, IADL, Dexterity, Mobility, Tone, Strength Cognitive Skills: Attention, Memory, Problem Solve, Safety Awareness, Thought, Understand     Visit Diagnosis: Other symptoms and signs involving the nervous system  Other abnormalities of gait and mobility  Unsteadiness on feet  Other lack of coordination  Frontal lobe and executive function deficit    Problem List Patient Active Problem List   Diagnosis Date Noted  . Muscle weakness (generalized)  02/04/2019  . Parkinsonism ([facility]) 05/26/2018  . Gait abnormality 01/23/2018  . Bradykinesia 01/23/2018  . OA (osteoarthritis) of knee 08/16/2014  . Postoperative anemia due to acute blood loss 11/12/2013  . OA (osteoarthritis) of hip 11/11/2013  . Palpitations 03/11/2012    [facility] 09/22/2019, 3:09 PM  [place] [facility] [address] [place], [place], [address] Phone: [phone]   Fax:  [phone]  Name: Kimberly Mccullough MRN: [id number] Date of Birth: [birth year]   Willa Frater, OTR/L [facility] [address] [place], [place]  [address] [phone] phone [phone] 09/22/19 3:10 PM

## 2019-09-29 ENCOUNTER — Ambulatory Visit: Payer: Medicare Other | Attending: Neurology | Admitting: Occupational Therapy

## 2019-09-29 ENCOUNTER — Other Ambulatory Visit: Payer: Self-pay

## 2019-09-29 ENCOUNTER — Encounter: Payer: Self-pay | Admitting: Occupational Therapy

## 2019-09-29 DIAGNOSIS — R278 Other lack of coordination: Secondary | ICD-10-CM | POA: Diagnosis present

## 2019-09-29 DIAGNOSIS — R29818 Other symptoms and signs involving the nervous system: Secondary | ICD-10-CM

## 2019-09-29 DIAGNOSIS — R2681 Unsteadiness on feet: Secondary | ICD-10-CM | POA: Diagnosis present

## 2019-09-29 DIAGNOSIS — R41844 Frontal lobe and executive function deficit: Secondary | ICD-10-CM | POA: Insufficient documentation

## 2019-09-29 DIAGNOSIS — R2689 Other abnormalities of gait and mobility: Secondary | ICD-10-CM | POA: Diagnosis present

## 2019-09-29 DIAGNOSIS — M6281 Muscle weakness (generalized): Secondary | ICD-10-CM | POA: Insufficient documentation

## 2019-09-29 NOTE — Patient Instructions (Addendum)
    Performing Daily Activities with Big Movements  Pick at least 2 activities a day and perform with BIG, DELIBERATE movements/effort.  Try different activities each day. This can make the activity easier and turn daily activities into exercise to prevent problems in the future!  If you are standing during the activity, make sure to keep feet apart and stand with good/big/PWR! UP posture.  Examples:  Dressing - Push arms in sleeves, twist when putting on jacket, push foot into pants, open hands to pull down shirt/put on socks/pull up pants  Buttoning - Open hands big (PWR! Hands) before fastening each button  Bathing - Wash/dry with long strokes  Brushing your teeth - Big, slow movements  Cutting food - Long deliberate cuts  Eating - Hold utensil in the middle, not the end  Picking up a cup/bottle - Open hand up big and get object all the way in palm  Opening jar/bottle - Move as much as you can with each turn  Putting on seatbelt - Twist when reaching  Hanging up clothes/getting clothes down from closet - Reach with big effort  Putting away groceries/dishes - Reach with big effort  Wiping counter/table - Move in big, long strokes  Stirring while cooking - Exaggerate movement  Cleaning windows - Move in big, long strokes  Sweeping - Move arms in big, long strokes  Vacuuming - Push with big movement  Folding clothes - Exaggerate arm movements  Washing car - Move in big, long strokes  Raking - Move arms in big, long strokes  Changing light bulb - Move as much as you can with each turn  Using a screwdriver - Move as much as you can with each turn  Walking into a store/restaurant - Walk with big steps, swing arms if able  Standing up from a chair/recliner/sofa - Feet apart, Scoot forward, lean forward, and stand with big effort    Ways to prevent future Parkinson's related complications:  1.   Exercise regularly.  Perform your therapy exercises and incorporate  safe aerobic exercise when possible (swimming, stationary bike, arm bike, seated stepper)  2.   Focus on BIGGER movements during daily activities- really reach overhead, straighten elbows and extend fingers  3.   When dressing or reaching for your seatbelt make sure to use your body to assist by twisting and looking at where you are reaching while you reach--this can help to minimize stress on the shoulder and reduce the risk of a rotator cuff tear  4.   Anytime you reach or move shoulder, make sure you have good upright posture.  5.  When you reach for something overhead, make sure your thumb is facing up.  This is a better position for your shoulder.  6.  Swing your arms when you walk!  People with PD are at increased risk for frozen shoulder and swinging your arms can reduce this risk.  7.  Keep you feet apart when you are standing to allow you to have better balance and reach further (which can help with shoulder rigidity).  Also make sure your feet are apart when you are sitting before you stand up.

## 2019-09-29 NOTE — Therapy (Signed)
[facility] [address] [place], [place], [address] Phone: [phone]   Fax:  [phone]  Occupational Therapy Treatment  Patient Details  Name: Kimberly Mccullough MRN: [id number] Date of Birth: 1945-11-13 Referring Provider (OT): Dr. Krista Blue   Encounter Date: 09/29/2019  OT End of Session - 09/29/19 1020    Visit Number  3    Number of Visits  5    Authorization Type  [employer] Medicare    Authorization Time Period  Pt only wants to be seen for 4 visits due to financial concerns, check to see if this is 4 treatments, or if it includes eval    Authorization - Visit Number  3    Authorization - Number of Visits  10    OT Start Time  1020    OT Stop Time  1105    OT Time Calculation (min)  45 min    Activity Tolerance  Patient tolerated treatment well    Behavior During Therapy  [facility] for tasks assessed/performed       Past Medical History:  Diagnosis Date  . Arthritis   . Hyperlipidemia   . Hypertension   . No blood products   . Osteopenia   . PTSD (post-traumatic stress disorder)   . Rapid heart beat    takes metoprolol to treat / denies HBP  . Tinnitus    VERY MILD; DOES NOT AFFECT HEARING     Past Surgical History:  Procedure Laterality Date  . ABDOMINAL HYSTERECTOMY    . BUNIONECTOMY     BILATERAL  . CHOLECYSTECTOMY    . JOINT REPLACEMENT    . TOTAL HIP ARTHROPLASTY Left 11/11/2013   Procedure: LEFT TOTAL HIP ARTHROPLASTY ANTERIOR APPROACH;  Surgeon: Gearlean Alf, MD;  Location: [facility];  Service: Orthopedics;  Laterality: Left;  . TOTAL KNEE ARTHROPLASTY Left 08/16/2014   Procedure: LEFT TOTAL KNEE ARTHROPLASTY;  Surgeon: Gaynelle Arabian, MD;  Location: [facility];  Service: Orthopedics;  Laterality: Left;  . TOTAL KNEE ARTHROPLASTY Right 10/22/2016   Procedure: RIGHT TOTAL KNEE ARTHROPLASTY;  Surgeon: Gaynelle Arabian, MD;  Location: [facility];  Service: Orthopedics;  Laterality: Right;    There were no vitals  filed for this visit.  Subjective Assessment - 09/29/19 1020    Subjective   I'm have trouble with my right hand still, but I am right handed.    Pertinent History  Parkinson's disease, CTS RUE  per pt, and arthritis    Patient Stated Goals  improve the movement of hands    Currently in Pain?  No/denies        Sit>stand using large amplitude movement strategies with min cueing for incr movement amplitude.    Practiced grasp/release of cylinder objects (bottles, cups) with use of PWR! Hands/large amplitude movements with min cueing.         OT Education - 09/29/19 1424    Education Details  Reviewed PWR! hands (basic 4) per pt questions; Began Education regarding Large amplitude movement strategies for ADLs/IADLs; Ways to decr risk of future complications related to PD (including PD education regarding how PD is unilateral in early stage)    Person(s) Educated  Patient    Methods  Explanation;Demonstration;Verbal cues;Handout    Comprehension  Verbalized understanding;Returned demonstration;Verbal cues required          OT Long Term Goals - 09/17/19 1338      OT LONG TERM GOAL #1   Title  I with PD specific  HEP.    Time  5    Period  Weeks    Status  New      OT LONG TERM GOAL #2   Title  Pt will verbalize understanding of adapted strategies for ADLS/IADLS to maximize safety and indpendence(difficulty opening peel cans, changing sheets, difficulty with LB dressing)    Time  5    Period  Weeks    Status  New      OT LONG TERM GOAL #3   Title  Pt will verbalize understanding of compensations for short term memory deficits and ways to keep thinking skills sharp.    Time  5    Period  Weeks    Status  New      OT LONG TERM GOAL #4   Title  Pt will demonstrate ability to retrieve items from an overhead shelf with -15 elbow extension for RUE, and -20 elbow ext for LUE.    Baseline  RUE -20, LUE -25    Time  5    Period  Weeks    Status  New      OT LONG TERM GOAL  #5   Title  Pt will demonstrate ability to write a short paragraph with 100% legibility and only min decrease in letter size.    Time  5    Period  Weeks    Status  New      OT LONG TERM GOAL #6   Title  Pt will verbalize understanding of awys to prevent future PD related complications and appropriate community resources prn.    Time  5    Period  Weeks    Status  New            Plan - 09/29/19 1428    Clinical Impression Statement  Pt demo decr awareness of PD-specific deficits, but demo improved movement amplitude with cueing.  Pt will benefit from reinforcement of education provided.  Decr frequency due to financial concerns is a limiting factor.    OT Occupational Profile and History  Problem Focused Assessment - Including review of records relating to presenting problem    Occupational performance deficits (Please refer to evaluation for details):  ADL's;IADL's;Play;Leisure;Social Participation    Body Structure / Function / Physical Skills  ADL;Endurance;UE functional use;Balance;Flexibility;FMC;ROM;Gait;GMC;Decreased knowledge of precautions;IADL;Dexterity;Mobility;Tone;Strength    Cognitive Skills  Attention;Memory;Problem Solve;Safety Awareness;Thought;Understand    Rehab Potential  Good    Clinical Decision Making  Limited treatment options, no task modification necessary    Comorbidities Affecting Occupational Performance:  May have comorbidities impacting occupational performance    Modification or Assistance to Complete Evaluation   No modification of tasks or assist necessary to complete eval    OT Frequency  1x / week    OT Duration  4 weeks   plus eval   OT Treatment/Interventions  Self-care/ADL training;Ultrasound;Energy conservation;Patient/family education;DME and/or AE instruction;Aquatic Therapy;Paraffin;Passive range of motion;Fluidtherapy;Functional Mobility Training;Moist Heat;Therapeutic exercise;Manual Therapy;Therapeutic activities;Cognitive  remediation/compensation;Neuromuscular education    Plan  ADL strategies (review HEP prn), - pt is only being seen for approx 4 weeks; Memory Compensation Strategies/Keeping thinking skills sharp    Consulted and Agree with Plan of Care  Patient       Patient will benefit from skilled therapeutic intervention in order to improve the following deficits and impairments:   Body Structure / Function / Physical Skills: ADL, Endurance, UE functional use, Balance, Flexibility, FMC, ROM, Gait, GMC, Decreased knowledge of precautions, IADL, Dexterity, Mobility, Tone, Strength Cognitive Skills:  Attention, Memory, Problem Solve, Safety Awareness, Thought, Understand     Visit Diagnosis: Other symptoms and signs involving the nervous system  Other lack of coordination  Frontal lobe and executive function deficit  Unsteadiness on feet    Problem List Patient Active Problem List   Diagnosis Date Noted  . Muscle weakness (generalized) 02/04/2019  . Parkinsonism (HCC) 05/26/2018  . Gait abnormality 01/23/2018  . Bradykinesia 01/23/2018  . OA (osteoarthritis) of knee 08/16/2014  . Postoperative anemia due to acute blood loss 11/12/2013  . OA (osteoarthritis) of hip 11/11/2013  . Palpitations 03/11/2012    [facility] 09/29/2019, 2:31 PM  [facility] [address] [place], [place], [address] Phone: [phone]   Fax:  [phone]  Name: Kimberly Mccullough MRN: [id number] Date of Birth: [birth year]   Willa Frater, OTR/L [facility] [address] [place], [place]  [address] [phone] phone [phone] 09/29/19 2:31 PM

## 2019-09-30 NOTE — Progress Notes (Deleted)
PATIENT: Kimberly Mccullough DOB: 10-11-1945  REASON FOR VISIT: follow up HISTORY FROM: patient  HISTORY OF PRESENT ILLNESS: Today 09/30/19  HISTORY   Kimberly C Washingtonis a 74 year old female, seen in request by her primary care PA Kerin Salen, for evaluation of decreased sense of smell, concerning about Parkinson's disease, initial evaluation was on January 23, 2018.  I have reviewed and summarized the referring note from the referring physician. She had past medical history of hypertension, status post bilateral knee replacement, left hip replacement. She lives alone since her husband passed away in 09/03/2010  She noticed gradual onset loss of sense of smell for the past couple years, was noted by her son that she has mild difficulty clear her foot from the floor sometimes, she denies significant tremor, mild unsteady gait she contributed to her joints replacement. She also was noted to be acting out of dreams, she exercise regularly, she is Jehovah's Witness, visiting people's house regularly  Laboratory evaluation in September 2019: Normal CMP, creatinine of 0.88, total cholesterol was 223, TSH 2.9  UPDATE May 26 2018: She has intermittent body twitching, she does exercise regularly, 3 times a week, continue has intermittent head shaking,  I personally reviewed MRI brain in October 2019, mild generalized atrophy, supratentorium small vessel disease, there was no acute abnormality. UPDATE December 01 2018: She noticed slow worsening gait abnormality, left hand shaking, she was not able to exercise regularly due to COVID-19  Update June 03, 2019 SS: After last visit, she was prescribed Azilect, but was too expensive, instead try Requip 0.25 mg 3 times a day to help with her tremor and slow movement. She does not think the Requip has been helpful, has been having hallucinations during the night, but she  knows they are not real.  For the last several weeks has noted stiffness in the evening around 8 PM, slow movement.  She has noticed that her handwriting is getting smaller.  She has to push off when she stands.  She has resting tremor in the right hand.  She completed physical therapy after last visit, it was beneficial.  She does exercise by walking, using a stationary bike.  She has not had any falls.  She presents today for follow-up unaccompanied.  Update October 01, 2019 SS:   REVIEW OF SYSTEMS: Out of a complete 14 system review of symptoms, the patient complains only of the following symptoms, and all other reviewed systems are negative.  ALLERGIES: Allergies  Allergen Reactions  . Statins Other (See Comments)    myalgia  . Other     Pt is Jehovas Witness and does not want any blood products Pt is Jehovas Witness and does not want any blood products    HOME MEDICATIONS: Outpatient Medications Prior to Visit  Medication Sig Dispense Refill  . alendronate (FOSAMAX) 70 MG tablet Take by mouth.    Marland Kitchen aspirin 81 MG tablet Take 81 mg by mouth daily.    Marland Kitchen BIOTIN FORTE PO Take by mouth.    . carbidopa-levodopa (SINEMET) 25-100 MG tablet Take 1/2 tablet at 8 am, 1/2 tablet 12 pm, 1 tablet at 6 pm 90 tablet 5  . FIBER ADULT GUMMIES PO Take 2 tablets by mouth daily.    . Glucosamine-Chondroit-Vit C-Mn (GLUCOSAMINE 1500 COMPLEX) CAPS Take by mouth.    . meloxicam (MOBIC) 15 MG tablet Take 15 mg by mouth daily.    . Multiple Vitamins-Minerals (MULTIVITAMIN ADULTS PO) Take by mouth.    Marland Kitchen  Omega-3 Fatty Acids (FISH OIL) 1000 MG CAPS Take by mouth.    Marland Kitchen omeprazole (PRILOSEC) 40 MG capsule     . Probiotic Product (PROBIOTIC PO) Take 1 tablet by mouth 2 (two) times a day.    . verapamil (CALAN-SR) 120 MG CR tablet TAKE 1 TABLET BY MOUTH  TWICE DAILY 180 tablet 3  . vitamin E (VITAMIN E) 400 UNIT capsule Take 400 Units by mouth daily.     No facility-administered medications prior to visit.     PAST MEDICAL HISTORY: Past Medical History:  Diagnosis Date  . Arthritis   . Hyperlipidemia   . Hypertension   . No blood products   . Osteopenia   . PTSD (post-traumatic stress disorder)   . Rapid heart beat    takes metoprolol to treat / denies HBP  . Tinnitus    VERY MILD; DOES NOT AFFECT HEARING     PAST SURGICAL HISTORY: Past Surgical History:  Procedure Laterality Date  . ABDOMINAL HYSTERECTOMY    . BUNIONECTOMY     BILATERAL  . CHOLECYSTECTOMY    . JOINT REPLACEMENT    . TOTAL HIP ARTHROPLASTY Left 11/11/2013   Procedure: LEFT TOTAL HIP ARTHROPLASTY ANTERIOR APPROACH;  Surgeon: Gearlean Alf, MD;  Location: [facility];  Service: Orthopedics;  Laterality: Left;  . TOTAL KNEE ARTHROPLASTY Left 08/16/2014   Procedure: LEFT TOTAL KNEE ARTHROPLASTY;  Surgeon: Gaynelle Arabian, MD;  Location: [facility];  Service: Orthopedics;  Laterality: Left;  . TOTAL KNEE ARTHROPLASTY Right 10/22/2016   Procedure: RIGHT TOTAL KNEE ARTHROPLASTY;  Surgeon: Gaynelle Arabian, MD;  Location: [facility];  Service: Orthopedics;  Laterality: Right;    FAMILY HISTORY: Family History  Problem Relation Age of Onset  . Dementia Mother   . Liver disease Father   . CAD Neg Hx     SOCIAL HISTORY: Social History   Socioeconomic History  . Marital status: Widowed    Spouse name: Not on file  . Number of children: 3  . Years of education: Not on file  . Highest education level: Not on file  Occupational History  . Occupation: Forensic psychologist: [employer]  Tobacco Use  . Smoking status: Never Smoker  . Smokeless tobacco: Never Used  Substance and Sexual Activity  . Alcohol use: No  . Drug use: No  . Sexual activity: Not Currently  Other Topics Concern  . Not on file  Social History Narrative  . Not on file   Social Determinants of Health   Financial Resource Strain:   . Difficulty of Paying Living Expenses:   Food Insecurity:   . Worried About Charity fundraiser in the Last Year:    . Arboriculturist in the Last Year:   Transportation Needs:   . Film/video editor (Medical):   Marland Kitchen Lack of Transportation (Non-Medical):   Physical Activity:   . Days of Exercise per Week:   . Minutes of Exercise per Session:   Stress:   . Feeling of Stress :   Social Connections:   . Frequency of Communication with Friends and Family:   . Frequency of Social Gatherings with Friends and Family:   . Attends Religious Services:   . Active Member of Clubs or Organizations:   . Attends Archivist Meetings:   Marland Kitchen Marital Status:   Intimate Partner Violence:   . Fear of Current or Ex-Partner:   . Emotionally Abused:   Marland Kitchen Physically Abused:   .  Sexually Abused:       PHYSICAL EXAM  There were no vitals filed for this visit. There is no height or weight on file to calculate BMI.  Generalized: Well developed, in no acute distress   Neurological examination  Mentation: Alert oriented to time, place, history taking. Follows all commands speech and language fluent Cranial nerve II-XII: Pupils were equal round reactive to light. Extraocular movements were full, visual field were full on confrontational test. Facial sensation and strength were normal. Uvula tongue midline. Head turning and shoulder shrug  were normal and symmetric. Motor: The motor testing reveals 5 over 5 strength of all 4 extremities. Good symmetric motor tone is noted throughout.  Sensory: Sensory testing is intact to soft touch on all 4 extremities. No evidence of extinction is noted.  Coordination: Cerebellar testing reveals good finger-nose-finger and heel-to-shin bilaterally.  Gait and station: Gait is normal. Tandem gait is normal. Romberg is negative. No drift is seen.  Reflexes: Deep tendon reflexes are symmetric and normal bilaterally.   DIAGNOSTIC DATA (LABS, IMAGING, TESTING) - I reviewed patient records, labs, notes, testing and imaging myself where available.  Lab Results  Component Value  Date   WBC 4.9 04/20/2019   HGB 14.9 04/20/2019   HCT 43.2 04/20/2019   MCV 86 04/20/2019   PLT 241 04/20/2019      Component Value Date/Time   NA 143 04/20/2019 1435   K 3.9 04/20/2019 1435   CL 106 04/20/2019 1435   CO2 22 04/20/2019 1435   GLUCOSE 108 (H) 04/20/2019 1435   GLUCOSE 111 (H) 10/24/2016 0417   BUN 20 04/20/2019 1435   CREATININE 0.86 04/20/2019 1435   CALCIUM 9.4 04/20/2019 1435   PROT 7.1 04/20/2019 1435   ALBUMIN 4.8 (H) 04/20/2019 1435   AST 25 04/20/2019 1435   ALT 20 04/20/2019 1435   ALKPHOS 64 04/20/2019 1435   BILITOT 0.2 04/20/2019 1435   GFRNONAA 68 04/20/2019 1435   GFRAA 78 04/20/2019 1435   Lab Results  Component Value Date   CHOL 257 (H) 04/20/2019   HDL 73 04/20/2019   LDLCALC 167 (H) 04/20/2019   LDLDIRECT 167.1 11/14/2012   TRIG 97 04/20/2019   CHOLHDL 3.5 04/20/2019   No results found for: HGBA1C No results found for: VITAMINB12 Lab Results  Component Value Date   TSH 1.510 04/20/2019      ASSESSMENT AND PLAN 74 y.o. year old female  has a past medical history of Arthritis, Hyperlipidemia, Hypertension, No blood products, Osteopenia, PTSD (post-traumatic stress disorder), Rapid heart beat, and Tinnitus. here with:  1.  Idiopathic Parkinson's disease   I spent 15 minutes with the patient. 50% of this time was spent   Margie Ege, [place], DNP 09/30/2019, 12:49 PM [facility] [address] [place], [place] [address] [phone]

## 2019-10-01 ENCOUNTER — Ambulatory Visit: Payer: Medicare Other | Admitting: Neurology

## 2019-10-06 ENCOUNTER — Encounter: Payer: Self-pay | Admitting: Occupational Therapy

## 2019-10-06 ENCOUNTER — Ambulatory Visit: Payer: Medicare Other | Admitting: Occupational Therapy

## 2019-10-06 ENCOUNTER — Other Ambulatory Visit: Payer: Self-pay

## 2019-10-06 DIAGNOSIS — R2681 Unsteadiness on feet: Secondary | ICD-10-CM

## 2019-10-06 DIAGNOSIS — R2689 Other abnormalities of gait and mobility: Secondary | ICD-10-CM

## 2019-10-06 DIAGNOSIS — R41844 Frontal lobe and executive function deficit: Secondary | ICD-10-CM

## 2019-10-06 DIAGNOSIS — R29818 Other symptoms and signs involving the nervous system: Secondary | ICD-10-CM

## 2019-10-06 DIAGNOSIS — M6281 Muscle weakness (generalized): Secondary | ICD-10-CM

## 2019-10-06 DIAGNOSIS — R278 Other lack of coordination: Secondary | ICD-10-CM

## 2019-10-06 NOTE — Patient Instructions (Addendum)
 (  Exercise) Monday Tuesday Wednesday Thursday Friday Saturday Sunday   PWR! standing           PWR! sitting           PWR! hands           Coordination           Bike           Walking                                                                  Memory Compensation Strategies  1. Use "WARM" strategy. W= write it down A=  associate it R=  repeat it M=  make a mental picture  2. You can keep a Glass blower/designer. Use a 3-ring notebook with sections for the following:  calendar, important names and phone numbers, medications, doctors' names/phone numbers, "to do list"/reminders, and a section to journal what you did each day  3. Use a calendar to write appointments down.  4. Write yourself a schedule for the day/make a routine This can be placed on the calendar or in a separate section of the Memory Notebook.  Keeping a regular schedule can help memory.  5. Use medication organizer with sections for each day or morning/evening pills  You may need help loading it  6. Keep a basket, or pegboard by the door.   Place items that you need to take out with you in the basket or on the pegboard.  You may also want to include a message board for reminders.  7. Use sticky notes. Place sticky notes with reminders in a place where the task is performed.  For example:  "turn off the stove" placed by the stove, "lock the door" placed on the door at eye level, "take your medications" on the bathroom mirror or by the place where you normally take your medications  8. Use alarms/timers.  Use while cooking to remind yourself to check on food or as a reminder to take your medicine, or as a reminder to make a call, or as a reminder to perform another task, etc.  9. Use a voice memo to record important information and notes for yourself.  10.  Organize and reduce clutter      Keeping Thinking Skills Sharp: 1. Jigsaw puzzles 2. Card/board games 3.  Talking on the phone/social events 4. Lumosity.com 5. Online games 6. Word serches/crossword puzzles 7.  Logic puzzles 8. Aerobic exercise (stationary bike) 9. Eating balanced diet (fruits & veggies) 10. Drink water 11. Try something new--new recipe, hobby 12. Crafts 13. Do a variety of activities that are challenging 14. Add cognitive activities to walking/exercising (think of animal/food/city with each letter of the alphabet, counting backwards, thinking of as many vegetables as you can, etc.).--Only do this  If safe (no freezing/falls).                Handwriting tips:    - stop and stretch/open hand frequently - use ballpoint pen (no gel ink) - use bigger grip pen - hold pen more upright - practice "l, n, o" in cursive - use lines as visual target - slow down, think big, focus on forming each letter

## 2019-10-06 NOTE — Therapy (Addendum)
[facility] [address] [place], [place], [address] Phone: [phone]   Fax:  [phone]  Occupational Therapy Treatment  Patient Details  Name: Kimberly Mccullough MRN: [id number] Date of Birth: [birth year] Referring Provider (OT): Dr. Krista Blue   Encounter Date: 10/06/2019  OT End of Session - 10/06/19 1024    Visit Number  4    Number of Visits  5    Authorization Type  [employer] Medicare    Authorization Time Period  Pt only wants to be seen for 4 visits due to financial concerns, check to see if this is 4 treatments, or if it includes eval    Authorization - Visit Number  4    Authorization - Number of Visits  10    OT Start Time  1023    OT Stop Time  1108    OT Time Calculation (min)  45 min    Activity Tolerance  Patient tolerated treatment well    Behavior During Therapy  [facility] for tasks assessed/performed       Past Medical History:  Diagnosis Date  . Arthritis   . Hyperlipidemia   . Hypertension   . No blood products   . Osteopenia   . PTSD (post-traumatic stress disorder)   . Rapid heart beat    takes metoprolol to treat / denies HBP  . Tinnitus    VERY MILD; DOES NOT AFFECT HEARING     Past Surgical History:  Procedure Laterality Date  . ABDOMINAL HYSTERECTOMY    . BUNIONECTOMY     BILATERAL  . CHOLECYSTECTOMY    . JOINT REPLACEMENT    . TOTAL HIP ARTHROPLASTY Left 11/11/2013   Procedure: LEFT TOTAL HIP ARTHROPLASTY ANTERIOR APPROACH;  Surgeon: Gearlean Alf, MD;  Location: [facility];  Service: Orthopedics;  Laterality: Left;  . TOTAL KNEE ARTHROPLASTY Left 08/16/2014   Procedure: LEFT TOTAL KNEE ARTHROPLASTY;  Surgeon: Gaynelle Arabian, MD;  Location: [facility];  Service: Orthopedics;  Laterality: Left;  . TOTAL KNEE ARTHROPLASTY Right 10/22/2016   Procedure: RIGHT TOTAL KNEE ARTHROPLASTY;  Surgeon: Gaynelle Arabian, MD;  Location: [facility];  Service: Orthopedics;  Laterality: Right;    There were no vitals  filed for this visit.  Subjective Assessment - 10/06/19 1023    Subjective   Didn't sleep well last night and took OTC medication--cautioned pt to discuss with neurologist due to possible drug interactions    Pertinent History  Parkinson's disease, CTS RUE  per pt, and arthritis    Patient Stated Goals  improve the movement of hands    Currently in Pain?  No/denies          Writing 2 sentences with good legibility, but min decr in size.  Noted incr difficulty at end of line and decr openness of webspace with writing at end of line.  Pt instructed in strategies for incr ease/size with handwriting (see pt instructions) and practiced continuous "l, n, o" with min cueing.  Also issued foam grip (tan) to assist with keeping open webspace        OT Education - 10/06/19 1119    Education Details  Education regarding PD medication timing (spacing and with meals), considerations and possible interactions (recommendation pt discuss difficulty sleeping and medication options with MD vs. taking something OTC).   Memory Compensation Strategies and Chartered certified accountant.  Handwriting Strategies.--See pt instructions    Person(s) Educated  Patient    Methods  Explanation;Handout  Comprehension  Verbalized understanding          OT Long Term Goals - 10/06/19 1127      OT LONG TERM GOAL #1   Title  I with PD specific HEP.    Time  5    Period  Weeks    Status  New      OT LONG TERM GOAL #2   Title  Pt will verbalize understanding of adapted strategies for ADLS/IADLS to maximize safety and indpendence(difficulty opening peel cans, changing sheets, difficulty with LB dressing)    Time  5    Period  Weeks    Status  New      OT LONG TERM GOAL #3   Title  Pt will verbalize understanding of compensations for short term memory deficits and ways to keep thinking skills sharp.    Time  5    Period  Weeks    Status  Achieved      OT LONG TERM GOAL #4   Title  Pt will demonstrate  ability to retrieve items from an overhead shelf with -15 elbow extension for RUE, and -20 elbow ext for LUE.    Baseline  RUE -20, LUE -25    Time  5    Period  Weeks    Status  New      OT LONG TERM GOAL #5   Title  Pt will demonstrate ability to write a short paragraph with 100% legibility and only min decrease in letter size.    Time  5    Period  Weeks    Status  New      OT LONG TERM GOAL #6   Title  Pt will verbalize understanding of awys to prevent future PD related complications and appropriate community resources prn.    Time  5    Period  Weeks    Status  New            Plan - 10/06/19 1125    Clinical Impression Statement  PT is progressing towards goals with understanding of education provided.  Pt will benefit from review of HEP.  Decr freqency due to pt's financial concerns is a barrier.    OT Occupational Profile and History  Problem Focused Assessment - Including review of records relating to presenting problem    Occupational performance deficits (Please refer to evaluation for details):  ADL's;IADL's;Play;Leisure;Social Participation    Body Structure / Function / Physical Skills  ADL;Endurance;UE functional use;Balance;Flexibility;FMC;ROM;Gait;GMC;Decreased knowledge of precautions;IADL;Dexterity;Mobility;Tone;Strength    Cognitive Skills  Attention;Memory;Problem Solve;Safety Awareness;Thought;Understand    Rehab Potential  Good    Clinical Decision Making  Limited treatment options, no task modification necessary    Comorbidities Affecting Occupational Performance:  May have comorbidities impacting occupational performance    Modification or Assistance to Complete Evaluation   No modification of tasks or assist necessary to complete eval    OT Frequency  1x / week    OT Duration  4 weeks   plus eval   OT Treatment/Interventions  Self-care/ADL training;Ultrasound;Energy conservation;Patient/family education;DME and/or AE instruction;Aquatic  Therapy;Paraffin;Passive range of motion;Fluidtherapy;Functional Mobility Training;Moist Heat;Therapeutic exercise;Manual Therapy;Therapeutic activities;Cognitive remediation/compensation;Neuromuscular education    Plan  review HEP and ADL strategies- pt is only being seen for approx 4 weeks due to financial concerns    Consulted and Agree with Plan of Care  Patient       Patient will benefit from skilled therapeutic intervention in order to improve the following deficits and impairments:  Body Structure / Function / Physical Skills: ADL, Endurance, UE functional use, Balance, Flexibility, FMC, ROM, Gait, GMC, Decreased knowledge of precautions, IADL, Dexterity, Mobility, Tone, Strength Cognitive Skills: Attention, Memory, Problem Solve, Safety Awareness, Thought, Understand     Visit Diagnosis: Other symptoms and signs involving the nervous system  Other lack of coordination  Frontal lobe and executive function deficit  Unsteadiness on feet  Other abnormalities of gait and mobility  Muscle weakness (generalized)    Problem List Patient Active Problem List   Diagnosis Date Noted  . Muscle weakness (generalized) 02/04/2019  . Parkinsonism (HCC) 05/26/2018  . Gait abnormality 01/23/2018  . Bradykinesia 01/23/2018  . OA (osteoarthritis) of knee 08/16/2014  . Postoperative anemia due to acute blood loss 11/12/2013  . OA (osteoarthritis) of hip 11/11/2013  . Palpitations 03/11/2012    [facility] 10/06/2019, 11:28 AM  [facility] [address] [place], [place], [address] Phone: [phone]   Fax:  [phone]  Name: Kimberly Mccullough MRN: [id number] Date of Birth: December 21, 1945   Willa Frater, OTR/L [facility] [address] [place], [place]  [address] [phone] phone [phone] 10/06/19 11:30 AM

## 2019-10-07 ENCOUNTER — Telehealth: Payer: Self-pay | Admitting: Neurology

## 2019-10-07 NOTE — Telephone Encounter (Signed)
Pt has returned call to RN, please call

## 2019-10-07 NOTE — Telephone Encounter (Signed)
LMVM for her that returned call.

## 2019-10-07 NOTE — Telephone Encounter (Signed)
Pt has called Sandy,RN back and would like a returned call

## 2019-10-07 NOTE — Telephone Encounter (Signed)
Patient called asking for a call back from RN or Maralyn Sago to discuss medication question. CB is [phone].

## 2019-10-08 NOTE — Telephone Encounter (Signed)
I called pt and relayed that per SS/NP no interaction noted with sinemet.  Pt stated she would take as needed.  Will call back as needed.

## 2019-10-08 NOTE — Telephone Encounter (Signed)
I see no interaction listed with up to date.

## 2019-10-08 NOTE — Telephone Encounter (Signed)
Spoke to pt and she is asking if ZZZQuil  (non habit forming sleep aide) is ok to take with her sinemet?  Please advise.

## 2019-10-13 ENCOUNTER — Other Ambulatory Visit: Payer: Self-pay

## 2019-10-13 ENCOUNTER — Encounter: Payer: Self-pay | Admitting: Occupational Therapy

## 2019-10-13 ENCOUNTER — Ambulatory Visit: Payer: Medicare Other | Admitting: Occupational Therapy

## 2019-10-13 DIAGNOSIS — R2689 Other abnormalities of gait and mobility: Secondary | ICD-10-CM

## 2019-10-13 DIAGNOSIS — R29818 Other symptoms and signs involving the nervous system: Secondary | ICD-10-CM

## 2019-10-13 DIAGNOSIS — R2681 Unsteadiness on feet: Secondary | ICD-10-CM

## 2019-10-13 DIAGNOSIS — R278 Other lack of coordination: Secondary | ICD-10-CM

## 2019-10-13 DIAGNOSIS — R41844 Frontal lobe and executive function deficit: Secondary | ICD-10-CM

## 2019-10-13 NOTE — Patient Instructions (Addendum)
   Keeping Thinking Skills Sharp: 1. Jigsaw puzzles 2. Card/board games 3. Talking on the phone/social events 4. [email or web address] 5. Online games 6. Word serches/crossword puzzles 7.  Logic puzzles 8. Aerobic exercise (stationary bike) 9. Eating balanced diet (fruits & veggies) 10. Drink water 11. Try something new--new recipe, hobby 12. Crafts 13. Do a variety of activities that are challenging 14. Add cognitive activities to walking/exercising (think of animal/food/city with each letter of the alphabet, counting backwards, thinking of as many vegetables as you can, etc.).--Only do this  If safe (no freezing/falls).   Memory Compensation Strategies  1. Use "WARM" strategy. W= write it down A=  associate it R=  repeat it M=  make a mental picture  2. You can keep a Glass blower/designer. Use a 3-ring notebook with sections for the following:  calendar, important names and phone numbers, medications, doctors' names/phone numbers, "to do list"/reminders, and a section to journal what you did each day  3. Use a calendar to write appointments down.  4. Write yourself a schedule for the day/make a routine This can be placed on the calendar or in a separate section of the Memory Notebook.  Keeping a regular schedule can help memory.  5. Use medication organizer with sections for each day or morning/evening pills  You may need help loading it  6. Keep a basket, or pegboard by the door.   Place items that you need to take out with you in the basket or on the pegboard.  You may also want to include a message board for reminders.  7. Use sticky notes. Place sticky notes with reminders in a place where the task is performed.  For example:  "turn off the stove" placed by the stove, "lock the door" placed on the door at eye level, "take your medications" on the bathroom mirror or by the place where you normally take your medications  8. Use alarms/timers.  Use while cooking to remind yourself to  check on food or as a reminder to take your medicine, or as a reminder to make a call, or as a reminder to perform another task, etc.  9. Use a voice memo to record important information and notes for yourself.  10.  Organize and reduce clutter

## 2019-10-13 NOTE — Therapy (Addendum)
[facility] [address] [place], [place], [address] Phone: [phone]   Fax:  [phone]  Occupational Therapy Treatment  Patient Details  Name: Kimberly Mccullough MRN: [id number] Date of Birth: November 02, 1945 Referring Provider (OT): Dr. Krista Blue   Encounter Date: 10/13/2019   OT End of Session - 10/13/19 1022    Visit Number 5    Number of Visits 5    Authorization Type [employer] Medicare    Authorization Time Period Pt only wants to be seen for 4 visits due to financial concerns, check to see if this is 4 treatments, or if it includes eval    Authorization - Visit Number 5    Authorization - Number of Visits 10    OT Start Time 1021    OT Stop Time 1103    OT Time Calculation (min) 42 min    Activity Tolerance Patient tolerated treatment well    Behavior During Therapy [facility] for tasks assessed/performed           Past Medical History:  Diagnosis Date  . Arthritis   . Hyperlipidemia   . Hypertension   . No blood products   . Osteopenia   . PTSD (post-traumatic stress disorder)   . Rapid heart beat    takes metoprolol to treat / denies HBP  . Tinnitus    VERY MILD; DOES NOT AFFECT HEARING     Past Surgical History:  Procedure Laterality Date  . ABDOMINAL HYSTERECTOMY    . BUNIONECTOMY     BILATERAL  . CHOLECYSTECTOMY    . JOINT REPLACEMENT    . TOTAL HIP ARTHROPLASTY Left 11/11/2013   Procedure: LEFT TOTAL HIP ARTHROPLASTY ANTERIOR APPROACH;  Surgeon: Gearlean Alf, MD;  Location: [facility];  Service: Orthopedics;  Laterality: Left;  . TOTAL KNEE ARTHROPLASTY Left 08/16/2014   Procedure: LEFT TOTAL KNEE ARTHROPLASTY;  Surgeon: Gaynelle Arabian, MD;  Location: [facility];  Service: Orthopedics;  Laterality: Left;  . TOTAL KNEE ARTHROPLASTY Right 10/22/2016   Procedure: RIGHT TOTAL KNEE ARTHROPLASTY;  Surgeon: Gaynelle Arabian, MD;  Location: [facility];  Service: Orthopedics;  Laterality: Right;    There were no vitals filed  for this visit.   Subjective Assessment - 10/13/19 1022    Subjective  nothing new.  Pt reports not enjoying exercise as much as she did before PD    Pertinent History Parkinson's disease, CTS RUE  per pt, and arthritis    Patient Stated Goals improve the movement of hands    Currently in Pain? No/denies           Checked remaining goals and reviewed education regarding ways to prevent future complications, answered pt questions regarding keeping thinking skills sharp.  Pt able to write with 100% legibility and no significant decr in size (print).  Pt educated in rationale and procedure for therapy follow-up as well as indications to return to therapy sooner.  Pt verbalized understanding.      OT Education - 10/14/19 1130    Education Details Sleep & PD and sleep tips; Reviewed and issued updated PD community resources    Person(s) Educated Patient    Methods Explanation;Handout    Comprehension Verbalized understanding               OT Long Term Goals - 10/13/19 1031      OT LONG TERM GOAL #1   Title I with PD specific HEP.    Time 5    Period Weeks  Status Achieved      OT LONG TERM GOAL #2   Title Pt will verbalize understanding of adapted strategies for ADLS/IADLS to maximize safety and indpendence(difficulty opening peel cans, changing sheets, difficulty with LB dressing)    Time 5    Period Weeks    Status Achieved      OT LONG TERM GOAL #3   Title Pt will verbalize understanding of compensations for short term memory deficits and ways to keep thinking skills sharp.    Time 5    Period Weeks    Status Achieved      OT LONG TERM GOAL #4   Title Pt will demonstrate ability to retrieve items from an overhead shelf with -15 elbow extension for RUE, and -20 elbow ext for LUE.    Baseline RUE -20, LUE -25    Time 5    Period Weeks    Status Achieved   10/14/19:  after initial min v.c. elbow ext WNL bilaterally     OT LONG TERM GOAL #5   Title Pt will  demonstrate ability to write a short paragraph with 100% legibility and only min decrease in letter size.    Time 5    Period Weeks    Status Achieved      OT LONG TERM GOAL #6   Title Pt will verbalize understanding of awys to prevent future PD related complications and appropriate community resources prn.    Time 5    Period Weeks    Status Achieved                 Plan - 10/14/19 1131    Clinical Impression Statement Pt verbalized understanding of education provided.    OT Occupational Profile and History Problem Focused Assessment - Including review of records relating to presenting problem    Occupational performance deficits (Please refer to evaluation for details): ADL's;IADL's;Play;Leisure;Social Participation    Body Structure / Function / Physical Skills ADL;Endurance;UE functional use;Balance;Flexibility;FMC;ROM;Gait;GMC;Decreased knowledge of precautions;IADL;Dexterity;Mobility;Tone;Strength    Cognitive Skills Attention;Memory;Problem Solve;Safety Awareness;Thought;Understand    Rehab Potential Good    Clinical Decision Making Limited treatment options, no task modification necessary    Comorbidities Affecting Occupational Performance: May have comorbidities impacting occupational performance    Modification or Assistance to Complete Evaluation  No modification of tasks or assist necessary to complete eval    OT Frequency 1x / week    OT Duration 4 weeks   plus eval   OT Treatment/Interventions Self-care/ADL training;Ultrasound;Energy conservation;Patient/family education;DME and/or AE instruction;Aquatic Therapy;Paraffin;Passive range of motion;Fluidtherapy;Functional Mobility Training;Moist Heat;Therapeutic exercise;Manual Therapy;Therapeutic activities;Cognitive remediation/compensation;Neuromuscular education    Plan d/c OT    Consulted and Agree with Plan of Care Patient           Patient will benefit from skilled therapeutic intervention in order to improve  the following deficits and impairments:   Body Structure / Function / Physical Skills: ADL, Endurance, UE functional use, Balance, Flexibility, FMC, ROM, Gait, GMC, Decreased knowledge of precautions, IADL, Dexterity, Mobility, Tone, Strength Cognitive Skills: Attention, Memory, Problem Solve, Safety Awareness, Thought, Understand     Visit Diagnosis: Other symptoms and signs involving the nervous system  Other lack of coordination  Frontal lobe and executive function deficit  Unsteadiness on feet  Other abnormalities of gait and mobility    Problem List Patient Active Problem List   Diagnosis Date Noted  . Muscle weakness (generalized) 02/04/2019  . Parkinsonism ([place]) 05/26/2018  . Gait abnormality 01/23/2018  . Bradykinesia 01/23/2018  .  OA (osteoarthritis) of knee 08/16/2014  . Postoperative anemia due to acute blood loss 11/12/2013  . OA (osteoarthritis) of hip 11/11/2013  . Palpitations 03/11/2012    OCCUPATIONAL THERAPY DISCHARGE SUMMARY  Visits from Start of Care: 5  Current functional level related to goals / functional outcomes: See above   Remaining deficits: Bradykinesia, decr coordination, decr balance/functional mobility, mild cognitive deficits   Education / Equipment: Pt was instructed in the following:  PD-specific HEP, adaptive strategies for ADLs/IADLs, ways to prevent future complications (including ways to challenge cognition), memory compensation strategies, community resources.  Pt verbalized understanding of all education provided.   Plan: Patient agrees to discharge.  Patient goals were  met. Patient is being discharged due to being pleased with the current functional level.  Pt would benefit from occupational therapy screen in approx 9 months to assess for need for further therapy/functional changes due to progressive nature of diagnosis.      [facility] 10/14/2019, 11:34 AM  [facility] [address] [place], [place], [address] Phone: [phone]   Fax:  [phone]  Name: NYA MONDS MRN: [id number] Date of Birth: 01/14/1946  Vianne Bulls, OTR/L [facility] [address] [place], [place]  [address] [phone] phone [phone] 10/14/19 11:34 AM

## 2019-10-20 ENCOUNTER — Ambulatory Visit: Payer: Medicare Other | Admitting: Occupational Therapy

## 2019-10-27 ENCOUNTER — Encounter: Payer: Medicare Other | Admitting: Occupational Therapy

## 2019-11-03 ENCOUNTER — Encounter: Payer: Medicare Other | Admitting: Occupational Therapy

## 2019-11-10 ENCOUNTER — Encounter: Payer: Medicare Other | Admitting: Occupational Therapy

## 2019-11-16 ENCOUNTER — Telehealth: Payer: Self-pay | Admitting: Neurology

## 2019-11-16 ENCOUNTER — Ambulatory Visit: Payer: Medicare Other | Admitting: Neurology

## 2019-11-16 NOTE — Progress Notes (Deleted)
PATIENT: Kimberly Mccullough DOB: 08-11-1945  REASON FOR VISIT: follow up HISTORY FROM: patient  HISTORY OF PRESENT ILLNESS: Today 11/16/19  HISTORY   Kimberly C Washingtonis a 74 year old female, seen in request by her primary care PA Kerin Salen, for evaluation of decreased sense of smell, concerning about Parkinson's disease, initial evaluation was on January 23, 2018.  I have reviewed and summarized the referring note from the referring physician. She had past medical history of hypertension, status post bilateral knee replacement, left hip replacement. She lives alone since her husband passed away in 2010-08-30  She noticed gradual onset loss of sense of smell for the past couple years, was noted by her son that she has mild difficulty clear her foot from the floor sometimes, she denies significant tremor, mild unsteady gait she contributed to her joints replacement. She also was noted to be acting out of dreams, she exercise regularly, she is Jehovah's Witness, visiting people's house regularly  Laboratory evaluation in September 2019: Normal CMP, creatinine of 0.88, total cholesterol was 223, TSH 2.9  UPDATE May 26 2018: She has intermittent body twitching, she does exercise regularly, 3 times a week, continue has intermittent head shaking,  I personally reviewed MRI brain in October 2019, mild generalized atrophy, supratentorium small vessel disease, there was no acute abnormality. UPDATE December 01 2018: She noticed slow worsening gait abnormality, left hand shaking, she was not able to exercise regularly due to COVID-19  Update June 03, 2019 SS: After last visit, she was prescribed Azilect, but was too expensive, instead try Requip 0.25 mg 3 times a day to help with her tremor and slow movement. She does not think the Requip has been helpful, has been having hallucinations during the night, but she  knows they are not real.  For the last several weeks has noted stiffness in the evening around 8 PM, slow movement.  She has noticed that her handwriting is getting smaller.  She has to push off when she stands.  She has resting tremor in the right hand.  She completed physical therapy after last visit, it was beneficial.  She does exercise by walking, using a stationary bike.  She has not had any falls.  She presents today for follow-up unaccompanied.  Update November 16, 2019 SS:   REVIEW OF SYSTEMS: Out of a complete 14 system review of symptoms, the patient complains only of the following symptoms, and all other reviewed systems are negative.  ALLERGIES: Allergies  Allergen Reactions  . Statins Other (See Comments)    myalgia  . Other     Pt is Jehovas Witness and does not want any blood products Pt is Jehovas Witness and does not want any blood products    HOME MEDICATIONS: Outpatient Medications Prior to Visit  Medication Sig Dispense Refill  . alendronate (FOSAMAX) 70 MG tablet Take by mouth.    Marland Kitchen aspirin 81 MG tablet Take 81 mg by mouth daily.    Marland Kitchen BIOTIN FORTE PO Take by mouth.    . carbidopa-levodopa (SINEMET) 25-100 MG tablet Take 1/2 tablet at 8 am, 1/2 tablet 12 pm, 1 tablet at 6 pm 90 tablet 5  . FIBER ADULT GUMMIES PO Take 2 tablets by mouth daily.    . Glucosamine-Chondroit-Vit C-Mn (GLUCOSAMINE 1500 COMPLEX) CAPS Take by mouth.    . meloxicam (MOBIC) 15 MG tablet Take 15 mg by mouth daily.    . Multiple Vitamins-Minerals (MULTIVITAMIN ADULTS PO) Take by mouth.    Marland Kitchen  Omega-3 Fatty Acids (FISH OIL) 1000 MG CAPS Take by mouth.    Marland Kitchen omeprazole (PRILOSEC) 40 MG capsule     . Probiotic Product (PROBIOTIC PO) Take 1 tablet by mouth 2 (two) times a day.    . verapamil (CALAN-SR) 120 MG CR tablet TAKE 1 TABLET BY MOUTH  TWICE DAILY 180 tablet 3  . vitamin E (VITAMIN E) 400 UNIT capsule Take 400 Units by mouth daily.     No facility-administered medications prior to visit.     PAST MEDICAL HISTORY: Past Medical History:  Diagnosis Date  . Arthritis   . Hyperlipidemia   . Hypertension   . No blood products   . Osteopenia   . PTSD (post-traumatic stress disorder)   . Rapid heart beat    takes metoprolol to treat / denies HBP  . Tinnitus    VERY MILD; DOES NOT AFFECT HEARING     PAST SURGICAL HISTORY: Past Surgical History:  Procedure Laterality Date  . ABDOMINAL HYSTERECTOMY    . BUNIONECTOMY     BILATERAL  . CHOLECYSTECTOMY    . JOINT REPLACEMENT    . TOTAL HIP ARTHROPLASTY Left 11/11/2013   Procedure: LEFT TOTAL HIP ARTHROPLASTY ANTERIOR APPROACH;  Surgeon: Gearlean Alf, MD;  Location: [facility];  Service: Orthopedics;  Laterality: Left;  . TOTAL KNEE ARTHROPLASTY Left 08/16/2014   Procedure: LEFT TOTAL KNEE ARTHROPLASTY;  Surgeon: Gaynelle Arabian, MD;  Location: [facility];  Service: Orthopedics;  Laterality: Left;  . TOTAL KNEE ARTHROPLASTY Right 10/22/2016   Procedure: RIGHT TOTAL KNEE ARTHROPLASTY;  Surgeon: Gaynelle Arabian, MD;  Location: [facility];  Service: Orthopedics;  Laterality: Right;    FAMILY HISTORY: Family History  Problem Relation Age of Onset  . Dementia Mother   . Liver disease Father   . CAD Neg Hx     SOCIAL HISTORY: Social History   Socioeconomic History  . Marital status: Widowed    Spouse name: Not on file  . Number of children: 3  . Years of education: Not on file  . Highest education level: Not on file  Occupational History  . Occupation: Forensic psychologist: [employer]  Tobacco Use  . Smoking status: Never Smoker  . Smokeless tobacco: Never Used  Substance and Sexual Activity  . Alcohol use: No  . Drug use: No  . Sexual activity: Not Currently  Other Topics Concern  . Not on file  Social History Narrative  . Not on file   Social Determinants of Health   Financial Resource Strain:   . Difficulty of Paying Living Expenses:   Food Insecurity:   . Worried About Charity fundraiser in the Last Year:    . Arboriculturist in the Last Year:   Transportation Needs:   . Film/video editor (Medical):   Marland Kitchen Lack of Transportation (Non-Medical):   Physical Activity:   . Days of Exercise per Week:   . Minutes of Exercise per Session:   Stress:   . Feeling of Stress :   Social Connections:   . Frequency of Communication with Friends and Family:   . Frequency of Social Gatherings with Friends and Family:   . Attends Religious Services:   . Active Member of Clubs or Organizations:   . Attends Archivist Meetings:   Marland Kitchen Marital Status:   Intimate Partner Violence:   . Fear of Current or Ex-Partner:   . Emotionally Abused:   Marland Kitchen Physically Abused:   .  Sexually Abused:       PHYSICAL EXAM  There were no vitals filed for this visit. There is no height or weight on file to calculate BMI.  Generalized: Well developed, in no acute distress   Neurological examination  Mentation: Alert oriented to time, place, history taking. Follows all commands speech and language fluent Cranial nerve II-XII: Pupils were equal round reactive to light. Extraocular movements were full, visual field were full on confrontational test. Facial sensation and strength were normal. Uvula tongue midline. Head turning and shoulder shrug  were normal and symmetric. Motor: The motor testing reveals 5 over 5 strength of all 4 extremities. Good symmetric motor tone is noted throughout.  Sensory: Sensory testing is intact to soft touch on all 4 extremities. No evidence of extinction is noted.  Coordination: Cerebellar testing reveals good finger-nose-finger and heel-to-shin bilaterally.  Gait and station: Gait is normal. Tandem gait is normal. Romberg is negative. No drift is seen.  Reflexes: Deep tendon reflexes are symmetric and normal bilaterally.   DIAGNOSTIC DATA (LABS, IMAGING, TESTING) - I reviewed patient records, labs, notes, testing and imaging myself where available.  Lab Results  Component Value  Date   WBC 4.9 04/20/2019   HGB 14.9 04/20/2019   HCT 43.2 04/20/2019   MCV 86 04/20/2019   PLT 241 04/20/2019      Component Value Date/Time   NA 143 04/20/2019 1435   K 3.9 04/20/2019 1435   CL 106 04/20/2019 1435   CO2 22 04/20/2019 1435   GLUCOSE 108 (H) 04/20/2019 1435   GLUCOSE 111 (H) 10/24/2016 0417   BUN 20 04/20/2019 1435   CREATININE 0.86 04/20/2019 1435   CALCIUM 9.4 04/20/2019 1435   PROT 7.1 04/20/2019 1435   ALBUMIN 4.8 (H) 04/20/2019 1435   AST 25 04/20/2019 1435   ALT 20 04/20/2019 1435   ALKPHOS 64 04/20/2019 1435   BILITOT 0.2 04/20/2019 1435   GFRNONAA 68 04/20/2019 1435   GFRAA 78 04/20/2019 1435   Lab Results  Component Value Date   CHOL 257 (H) 04/20/2019   HDL 73 04/20/2019   LDLCALC 167 (H) 04/20/2019   LDLDIRECT 167.1 11/14/2012   TRIG 97 04/20/2019   CHOLHDL 3.5 04/20/2019   No results found for: HGBA1C No results found for: VITAMINB12 Lab Results  Component Value Date   TSH 1.510 04/20/2019      ASSESSMENT AND PLAN 74 y.o. year old female  has a past medical history of Arthritis, Hyperlipidemia, Hypertension, No blood products, Osteopenia, PTSD (post-traumatic stress disorder), Rapid heart beat, and Tinnitus. here with ***   I spent 15 minutes with the patient. 50% of this time was spent   Margie Ege, [place], DNP 11/16/2019, 5:35 AM [facility] [address] [place], [place] [address] [phone]

## 2019-11-16 NOTE — Telephone Encounter (Signed)
Called pt and relayed that due to condition that we see her for, that in office would be better for her.  She then stated that she was exposed to person that tested positive for covid a week ago.  She is not having sx, had vaccine (moderna).  I relayed that will keep VV and if change will call her back.

## 2019-11-16 NOTE — Telephone Encounter (Signed)
..   Pt understands that although there may be some limitations with this type of visit, we will take all precautions to reduce any security or privacy concerns.  Pt understands that this will be treated like an in office visit and we will file with pt's insurance, and there may be a patient responsible charge related to this service. ? ?

## 2019-11-17 ENCOUNTER — Telehealth (INDEPENDENT_AMBULATORY_CARE_PROVIDER_SITE_OTHER): Payer: Medicare Other | Admitting: Neurology

## 2019-11-17 ENCOUNTER — Encounter: Payer: Self-pay | Admitting: Neurology

## 2019-11-17 DIAGNOSIS — G20C Parkinsonism, unspecified: Secondary | ICD-10-CM

## 2019-11-17 DIAGNOSIS — G20 Parkinson's disease: Secondary | ICD-10-CM

## 2019-11-17 DIAGNOSIS — R269 Unspecified abnormalities of gait and mobility: Secondary | ICD-10-CM | POA: Diagnosis not present

## 2019-11-17 MED ORDER — CARBIDOPA-LEVODOPA 25-100 MG PO TABS: ORAL_TABLET | ORAL | 3 refills | Status: DC

## 2019-11-17 NOTE — Progress Notes (Signed)
Virtual Visit via Video Note  I connected with Kimberly Mccullough on 11/17/19 at  2:45 PM EDT by a video enabled telemedicine application and verified that I am speaking with the correct person using two identifiers.  Location: Patient: at her home Provider: in the office    I discussed the limitations of evaluation and management by telemedicine and the availability of in person appointments. The patient expressed understanding and agreed to proceed.  History of Present Illness: Kimberly Farace Washingtonis a 74 year old female, seen in request by her primary care PA Kerin Salen, for evaluation of decreased sense of smell, concerning about Parkinson's disease, initial evaluation was on January 23, 2018.  I have reviewed and summarized the referring note from the referring physician. She had past medical history of hypertension, status post bilateral knee replacement, left hip replacement. She lives alone since her husband passed away in 08-17-2010  She noticed gradual onset loss of sense of smell for the past couple years, was noted by her son that she has mild difficulty clear her foot from the floor sometimes, she denies significant tremor, mild unsteady gait she contributed to her joints replacement. She also was noted to be acting out of dreams, she exercise regularly, she is Jehovah's Witness, visiting people's house regularly  Laboratory evaluation in September 2019: Normal CMP, creatinine of 0.88, total cholesterol was 223, TSH 2.9  UPDATE May 26 2018: She has intermittent body twitching, she does exercise regularly, 3 times a week, continue has intermittent head shaking,  I personally reviewed MRI brain in October 2019, mild generalized atrophy, supratentorium small vessel disease, there was no acute abnormality. UPDATE December 01 2018: She noticed slow worsening gait abnormality, left hand shaking, she was  not able to exercise regularly due to COVID-19  Update June 03, 2019 SS: After last visit, she was prescribed Azilect, but was too expensive, instead try Requip 0.25 mg 3 times a day to help with her tremor and slow movement. She does not think the Requip has been helpful, has been having hallucinations during the night, but she knows they are not real.  For the last several weeks has noted stiffness in the evening around 8 PM, slow movement.  She has noticed that her handwriting is getting smaller.  She has to push off when she stands.  She has resting tremor in the right hand.  She completed physical therapy after last visit, it was beneficial.  She does exercise by walking, using a stationary bike.  She has not had any falls.  She presents today for follow-up unaccompanied.  Update November 17, 2019 SS: Here today for follow-up via virtual visit, due to Covid exposure.  Tolerating Sinemet 25-100 mg, 1/2 tablet 3 times a day, noted improvement in tremor, stiffness, and overall mobility.  No falls.  Completed OT, was very beneficial.  Lives alone.  No longer has hallucinations after Requip discontinuation.  No freezing episodes.  Tremor to her left mouth, left hand, left leg at times, worse when nervous.  Tries to exercise, uses a stationary bike or walks.  Noted some difficulty swallowing.  Using Metamucil for constipation.  Observations/Objective: Via virtual visit, speech is clear and concise, month the face is seen, speech tone is somewhat low, is alert and oriented, facial symmetry noted, no arm drift, no tremor noted with camera view or with posturing.  Gait appears intact, steady, seems to have good stride.   Assessment and Plan: 1.  Idiopathic Parkinson's disease -Noted good  benefit with Sinemet 25/100 mg 1/2 tablet 3 times a day, improved movement, gait, will increase to 1 tablet 3 times a day to hopefully further improve motor symptoms, no longer having hallucinations with discontinuation of  Requip -Encouraged to continue consistent exercise, check into [place] steady exercise program -MRI of the brain showed mild generalized atrophy, mild supratentorial small vessel disease -Follow-up in 5 months with Dr. Terrace Arabia or sooner if needed  Follow Up Instructions: 04/12/2020 10:00   I discussed the assessment and treatment plan with the patient. The patient was provided an opportunity to ask questions and all were answered. The patient agreed with the plan and demonstrated an understanding of the instructions.   The patient was advised to call back or seek an in-person evaluation if the symptoms worsen or if the condition fails to improve as anticipated.  I spent 30 minutes of face-to-face and non-face-to-face time with patient.  This included previsit chart review, lab review, study review, order entry, electronic health record documentation, patient education.  Otila Kluver, DNP  [facility] [address], [address] [place], [place] [address] [phone]

## 2019-11-17 NOTE — Patient Instructions (Signed)
Increase sinemet 25-100 mg 1 tablet 3 times daily

## 2020-01-25 ENCOUNTER — Telehealth: Payer: Self-pay | Admitting: Neurology

## 2020-01-25 NOTE — Telephone Encounter (Signed)
Pt informed, appreciated call back

## 2020-01-25 NOTE — Telephone Encounter (Signed)
Pt would like to speak to RN or provider about some medications that she is to start taking and is wanting to know if provider is ok with her taking them. Please advise.

## 2020-01-25 NOTE — Telephone Encounter (Signed)
Pt wanted to make Margie Ege, NP, know that her podiatrist prescribed Ciclopirox cream for foot.    She would like a confirmation from NP that this medication will not interact with her neurology medications.

## 2020-01-25 NOTE — Telephone Encounter (Signed)
I checked up to date, no interaction found between Sinemet and Ciclopirox foot cream.

## 2020-01-27 ENCOUNTER — Telehealth: Payer: Self-pay | Admitting: Neurology

## 2020-01-27 NOTE — Telephone Encounter (Signed)
Pt called, getting PAT scan on Friday. Wanting to know if it's ok to go through with it. Would like a call from the nurse.

## 2020-01-27 NOTE — Telephone Encounter (Signed)
No objections to the scan from my standpoint. Thanks for the update.

## 2020-01-27 NOTE — Telephone Encounter (Signed)
Spoke with patient. She is aware that Maralyn Sago is ok with her scan on Friday.   Nothing further needed at time of call.

## 2020-01-27 NOTE — Telephone Encounter (Signed)
Spoke with patient. She stated that she has been dealing with some stomach issues and weight loss for the past few months. She is currently being evaluated by Nelida Gores PA at [facility]. She stated that she is scheduled for a PET scan on Friday (althought chart says CT abdomen/pelvis with contrast).   She wants to make sure she is cleared from a neurological standpoint for the scan. I advised her that unless she has an allergy to the contrast, she should be ok for the scan. She also wanted to make sure that we could see the images. I advised her that we can see the images on the PACS system and can see the OV notes in Care Everywhere.   Maralyn Sago, do you have any objections to her CT scan on Friday?

## 2020-02-03 ENCOUNTER — Other Ambulatory Visit: Payer: Self-pay | Admitting: Nurse Practitioner

## 2020-02-22 ENCOUNTER — Ambulatory Visit: Payer: Medicare Other | Admitting: Neurology

## 2020-02-23 ENCOUNTER — Telehealth: Payer: Self-pay | Admitting: Neurology

## 2020-02-23 MED ORDER — CARBIDOPA-LEVODOPA 25-100 MG PO TABS: 1.0000 | ORAL_TABLET | Freq: Three times a day (TID) | ORAL | 0 refills | Status: DC

## 2020-02-23 NOTE — Telephone Encounter (Signed)
I called pt and I relayed that she can get a 30 day supply from local and then resume with optum afterwards.  She was ok with this.  She is on protonix now for reflux.  She will take 0730 then 0800 take sinemet then eat afterwards.  She was concerned about timing. She appreciated call.

## 2020-02-23 NOTE — Addendum Note (Signed)
Addended by: Guy Begin on: 02/23/2020 01:44 PM   Modules accepted: Orders

## 2020-02-23 NOTE — Telephone Encounter (Signed)
Pt states a Dr @ [facility] prescribed her Pantotrazole, pt would like to know if it will be ok to take this with  pt's other medications.  Also  carbidopa-levodopa (SINEMET) 25-100 MG tablet is out of stock @ [facility] .  Pt is asking for a call to know how can she get this medication.

## 2020-04-04 ENCOUNTER — Telehealth: Payer: Self-pay | Admitting: Neurology

## 2020-04-04 NOTE — Telephone Encounter (Signed)
She will be having a colonoscopy preceded by prep this week.  She asked about the timing of her Parkinson medication.  She would like to take her first morning dose earlier (around 3 AM) the day of the colonoscopy.  That will be fine for her to do so.  Her next dose will be around noon or early afternoon.

## 2020-04-05 NOTE — Progress Notes (Signed)
CARDIOLOGY OFFICE NOTE  Date:  04/19/2020    [name] Date of Birth: 1946-04-08 Medical Record #[id number]  PCP:  Kerin Salen, PA-C  Cardiologist:  Titus Mould    Chief Complaint  Patient presents with  . Follow-up    Seen for Dr. Clifton James    History of Present Illness: Kimberly Mccullough is a 74 y.o. female who presents today for a follow up visit.  Seen for Dr. Clifton James.    She has a history of HLD, HTN, PTSD, & palpitations.  She had negative cardiac monitors in 2013 and 2017. She is not known to have CAD. She is a TEFL teacher witness. Stress echo in 2014 without ischemia. Exercise stress test in 2017 without ischemic EKG changes. She has tended to have chronic anxieties over past event surrounding her husband's death - she has declined psyche help.  She has been found to have Parkinson's.   Last seen by me one year ago. Very depressed.    Comes in today. Here alone. Doing about the same. Weight going down. BP is fair. No chest pain. She has been vaccinated for COVID. Staying home more with the pandemic. Her son has moved out - she struggles with being alone - admits to some "day terrors and nightmares" but says she is coping - not getting any counseling. Trying to exercise and this does seem to help. No real palpitations. Her Parkinson's seems stable. No real concerns.   Past Medical History:  Diagnosis Date  . Arthritis   . Hyperlipidemia   . Hypertension   . No blood products   . Osteopenia   . PTSD (post-traumatic stress disorder)   . Rapid heart beat    takes metoprolol to treat / denies HBP  . Tinnitus    VERY MILD; DOES NOT AFFECT HEARING     Past Surgical History:  Procedure Laterality Date  . ABDOMINAL HYSTERECTOMY    . BUNIONECTOMY     BILATERAL  . CHOLECYSTECTOMY    . JOINT REPLACEMENT    . TOTAL HIP ARTHROPLASTY Left 11/11/2013   Procedure: LEFT TOTAL HIP ARTHROPLASTY ANTERIOR APPROACH;  Surgeon: Loanne Drilling,  MD;  Location: [facility];  Service: Orthopedics;  Laterality: Left;  . TOTAL KNEE ARTHROPLASTY Left 08/16/2014   Procedure: LEFT TOTAL KNEE ARTHROPLASTY;  Surgeon: Ollen Gross, MD;  Location: [facility];  Service: Orthopedics;  Laterality: Left;  . TOTAL KNEE ARTHROPLASTY Right 10/22/2016   Procedure: RIGHT TOTAL KNEE ARTHROPLASTY;  Surgeon: Ollen Gross, MD;  Location: [facility];  Service: Orthopedics;  Laterality: Right;     Medications: Current Meds  Medication Sig  . alendronate (FOSAMAX) 70 MG tablet Take by mouth.  Marland Kitchen aspirin 81 MG tablet Take 81 mg by mouth daily.  Marland Kitchen bismuth-metronidazole-tetracycline (PYLERA) 140-125-125 MG capsule Take by mouth.  . calcium carbonate (OSCAL) 1500 (600 Ca) MG TABS tablet Take by mouth.  . carbidopa-levodopa (SINEMET) 25-100 MG tablet Take 1 tablet 3 times daily  . FIBER ADULT GUMMIES PO Take 2 tablets by mouth daily.  . Glucosamine-Chondroit-Vit C-Mn (GLUCOSAMINE 1500 COMPLEX) CAPS Take by mouth.  . meloxicam (MOBIC) 15 MG tablet Take 15 mg by mouth daily.  . Multiple Vitamins-Minerals (MULTIVITAMIN ADULTS PO) Take by mouth.  . Omega-3 Fatty Acids (FISH OIL) 1000 MG CAPS Take by mouth.  . pantoprazole (PROTONIX) 40 MG tablet Take 40 mg by mouth 2 (two) times daily.  . polyethylene glycol powder (GLYCOLAX/MIRALAX) 17 GM/SCOOP powder Take 17 grams  twice daily  90 day supply  . verapamil (CALAN-SR) 120 MG CR tablet TAKE 1 TABLET BY MOUTH  TWICE DAILY  . vitamin E 180 MG (400 UNITS) capsule Take 400 Units by mouth daily.     Allergies: Allergies  Allergen Reactions  . Statins Other (See Comments)    myalgia  . Other     Pt is Jehovas Witness and does not want any blood products Pt is Jehovas Witness and does not want any blood products    Social History: The patient  reports that she has never smoked. She has never used smokeless tobacco. She reports that she does not drink alcohol and does not use drugs.   Family History: The patient's family  history includes Dementia in her mother; Liver disease in her father.   Review of Systems: Please see the history of present illness.   All other systems are reviewed and negative.   Physical Exam: VS:  BP 140/82   Pulse 75   Ht 5' 9.5" (1.765 m)   Wt 156 lb 3.2 oz (70.9 kg)   SpO2 98%   BMI 22.74 kg/m  .  BMI Body mass index is 22.74 kg/m.  Wt Readings from Last 3 Encounters:  04/19/20 156 lb 3.2 oz (70.9 kg)  04/12/20 155 lb (70.3 kg)  06/03/19 167 lb 12.8 oz (76.1 kg)    General: Alert and in no acute distress. Her weight continues to drop. Affect flat.  Neck: Supple, no JVD, carotid bruits, or masses noted.  Cardiac: Regular rate and rhythm. No murmurs, rubs, or gallops. No edema.  Respiratory:  Lungs are clear to auscultation bilaterally with normal work of breathing.  GI: Soft and nontender.  MS: No deformity or atrophy. Gait and ROM intact.  Skin: Warm and dry. Color is normal.  Neuro:  Strength and sensation are intact and no gross focal deficits noted.  Psych: Alert, appropriate and with normal affect.   LABORATORY DATA:  EKG:  EKG is ordered today.  Personally reviewed by me. This demonstrates NSR.  Lab Results  Component Value Date   WBC 4.9 04/20/2019   HGB 14.9 04/20/2019   HCT 43.2 04/20/2019   PLT 241 04/20/2019   GLUCOSE 108 (H) 04/20/2019   CHOL 257 (H) 04/20/2019   TRIG 97 04/20/2019   HDL 73 04/20/2019   LDLDIRECT 167.1 11/14/2012   LDLCALC 167 (H) 04/20/2019   ALT 20 04/20/2019   AST 25 04/20/2019   NA 143 04/20/2019   K 3.9 04/20/2019   CL 106 04/20/2019   CREATININE 0.86 04/20/2019   BUN 20 04/20/2019   CO2 22 04/20/2019   TSH 1.510 04/20/2019   INR 0.93 10/16/2016     BNP (last 3 results) No results for input(s): BNP in the last 8760 hours.  ProBNP (last 3 results) No results for input(s): PROBNP in the last 8760 hours.   Other Studies Reviewed Today:  Holter Study Highlights 2017   Sinus rhythm with sinus pauses. Longest  pause is 2.0 seconds.  Frequent premature atrial contractions Rare premature ventricular contractions   If she is having dizziness or near syncope, would need to hold Verapamil.      GXT Study Highlights 2017    There was no ST segment deviation noted during stress.  Blood pressure demonstrated a hypertensive response to exercise.  Negative exercise treadmill test at an adequate workload and heart rate achieved.          Assessment/Plan:  1. HTN -  BP ok with concurrent Parkinson's - no changes made today.   2. Palpitations - not endorsed  3. Parkinson's - per Neuro.   4. PTSD - very sad situation for her.   Current medicines are reviewed with the patient today.  The patient does not have concerns regarding medicines other than what has been noted above.  The following changes have been made:  See above.  Labs/ tests ordered today include:    Orders Placed This Encounter  Procedures  . EKG 12-Lead     Disposition:   FU with Dr. Clifton James in one year. She is aware that I am leaving in February.    Patient is agreeable to this plan and will call if any problems develop in the interim.   SignedNorma Fredrickson, NP  04/19/2020 2:09 PM  [facility] [address] [place], [place]  [address] Phone: [phone] Fax: [phone]

## 2020-04-12 ENCOUNTER — Ambulatory Visit: Payer: Medicare Other | Admitting: Neurology

## 2020-04-12 ENCOUNTER — Other Ambulatory Visit: Payer: Self-pay

## 2020-04-12 ENCOUNTER — Encounter: Payer: Self-pay | Admitting: Neurology

## 2020-04-12 VITALS — BP 139/86 | HR 89 | Ht 70.0 in | Wt 155.0 lb

## 2020-04-12 DIAGNOSIS — G20 Parkinson's disease: Secondary | ICD-10-CM

## 2020-04-12 DIAGNOSIS — G243 Spasmodic torticollis: Secondary | ICD-10-CM

## 2020-04-12 DIAGNOSIS — R4789 Other speech disturbances: Secondary | ICD-10-CM

## 2020-04-12 DIAGNOSIS — R479 Unspecified speech disturbances: Secondary | ICD-10-CM | POA: Insufficient documentation

## 2020-04-12 MED ORDER — CARBIDOPA-LEVODOPA 25-100 MG PO TABS: ORAL_TABLET | ORAL | 3 refills | Status: AC

## 2020-04-12 NOTE — Progress Notes (Signed)
Chief Complaint  Patient presents with  . Parkinson's Disease    She has continued taking Sinemet 25-100mg , one tablet TID. Tremors unchanged. No issues with freezing up. Feels she has slowed down somewhat.     HISTORY:  Kimberly Mccullough a 74 year old female, seen in request by her primary care PA Kerin Salen, for evaluation of decreased sense of smell, concerning about Parkinson's disease, initial evaluation was on January 23, 2018.  I have reviewed and summarized the referring note from the referring physician. She had past medical history of hypertension, status post bilateral knee replacement, left hip replacement. She lives alone since her husband passed away in 2010-08-24  She noticed gradual onset loss of sense of smell for the past couple years, was noted by her son that she has mild difficulty clear her foot from the floor sometimes, she denies significant tremor, mild unsteady gait she contributed to her joints replacement. She also was noted to be acting out of dreams, she exercise regularly, she is Jehovah's Witness, visiting people's house regularly  Laboratory evaluation in September 2019: Normal CMP, creatinine of 0.88, total cholesterol was 223, TSH 2.9  UPDATE May 26 2018: She has intermittent body twitching, she does exercise regularly, 3 times a week, continue has intermittent head shaking,  I personally reviewed MRI brain in October 2019, mild generalized atrophy, supratentorium small vessel disease, there was no acute abnormality.  UPDATE December 01 2018: She noticed slow worsening gait abnormality, left hand shaking, she was not able to exercise regularly due to COVID-19  UPDATE Apr 12 2020: She is overall doing well with current Sinemet 25/100 mg 3 times daily, but complains of dragging her left leg while walking, she also complains of excessive dream, vivid dreams, frequent movement  during sleep,  Also noticed gradual worsening head tilting towards the right shoulder, but denies significant neck or shoulder pain,  She also complains of weight loss, went through upper and lower GI colonoscopy recently.  She complains of soft voice, especially with prolonged talking,  REVIEW OF SYSTEMS: Full 14 system review of systems performed and notable only for as above. All other review of systems were negative.  ALLERGIES: Allergies  Allergen Reactions  . Statins Other (See Comments)    myalgia  . Other     Pt is Jehovas Witness and does not want any blood products Pt is Jehovas Witness and does not want any blood products    HOME MEDICATIONS: Current Outpatient Medications  Medication Sig Dispense Refill  . alendronate (FOSAMAX) 70 MG tablet Take by mouth.    Marland Kitchen aspirin 81 MG tablet Take 81 mg by mouth daily.    . calcium carbonate (OSCAL) 1500 (600 Ca) MG TABS tablet Take by mouth.    . carbidopa-levodopa (SINEMET) 25-100 MG tablet Take 1 tablet 3 times daily 270 tablet 3  . FIBER ADULT GUMMIES PO Take 2 tablets by mouth daily.    . Glucosamine-Chondroit-Vit C-Mn (GLUCOSAMINE 1500 COMPLEX) CAPS Take by mouth.    . meloxicam (MOBIC) 15 MG tablet Take 15 mg by mouth daily.    . Multiple Vitamins-Minerals (MULTIVITAMIN ADULTS PO) Take by mouth.    . Omega-3 Fatty Acids (FISH OIL) 1000 MG CAPS Take by mouth.    . pantoprazole (PROTONIX) 40 MG tablet Take by mouth.    . Probiotic Product (PROBIOTIC PO) Take 1 tablet by mouth 2 (two) times a day.    . verapamil (CALAN-SR) 120 MG CR tablet TAKE 1 TABLET BY  MOUTH  TWICE DAILY 180 tablet 0  . vitamin E 180 MG (400 UNITS) capsule Take 400 Units by mouth daily.     No current facility-administered medications for this visit.    PAST MEDICAL HISTORY: Past Medical History:  Diagnosis Date  . Arthritis   . Hyperlipidemia   . Hypertension   . No blood products   . Osteopenia   . PTSD (post-traumatic stress disorder)   .  Rapid heart beat    takes metoprolol to treat / denies HBP  . Tinnitus    VERY MILD; DOES NOT AFFECT HEARING     PAST SURGICAL HISTORY: Past Surgical History:  Procedure Laterality Date  . ABDOMINAL HYSTERECTOMY    . BUNIONECTOMY     BILATERAL  . CHOLECYSTECTOMY    . JOINT REPLACEMENT    . TOTAL HIP ARTHROPLASTY Left 11/11/2013   Procedure: LEFT TOTAL HIP ARTHROPLASTY ANTERIOR APPROACH;  Surgeon: Loanne Drilling, MD;  Location: [facility];  Service: Orthopedics;  Laterality: Left;  . TOTAL KNEE ARTHROPLASTY Left 08/16/2014   Procedure: LEFT TOTAL KNEE ARTHROPLASTY;  Surgeon: Ollen Gross, MD;  Location: [facility];  Service: Orthopedics;  Laterality: Left;  . TOTAL KNEE ARTHROPLASTY Right 10/22/2016   Procedure: RIGHT TOTAL KNEE ARTHROPLASTY;  Surgeon: Ollen Gross, MD;  Location: [facility];  Service: Orthopedics;  Laterality: Right;    FAMILY HISTORY: Family History  Problem Relation Age of Onset  . Dementia Mother   . Liver disease Father   . CAD Neg Hx     SOCIAL HISTORY: Social History   Socioeconomic History  . Marital status: Widowed    Spouse name: Not on file  . Number of children: 3  . Years of education: Not on file  . Highest education level: Not on file  Occupational History  . Occupation: Cytogeneticist: [employer]  Tobacco Use  . Smoking status: Never Smoker  . Smokeless tobacco: Never Used  Substance and Sexual Activity  . Alcohol use: No  . Drug use: No  . Sexual activity: Not Currently  Other Topics Concern  . Not on file  Social History Narrative  . Not on file   Social Determinants of Health   Financial Resource Strain: Not on file  Food Insecurity: Not on file  Transportation Needs: Not on file  Physical Activity: Not on file  Stress: Not on file  Social Connections: Not on file  Intimate Partner Violence: Not on file     PHYSICAL EXAM   Vitals:   04/12/20 1007  BP: 139/86  Pulse: 89  Weight: 155 lb (70.3 kg)  Height: 5'  10" (1.778 m)   Not recorded     Body mass index is 22.24 kg/m.  PHYSICAL EXAMNIATION:  Gen: NAD, conversant, well nourised, well groomed                     Cardiovascular: Regular rate rhythm, no peripheral edema, warm, nontender. Eyes: Conjunctivae clear without exudates or hemorrhage Neck: Supple, no carotid bruits. Pulmonary: Clear to auscultation bilaterally   NEUROLOGICAL EXAM:  MENTAL STATUS: Speech:    Mild microphonia; fluent and spontaneous with normal comprehension.  Cognition:     Orientation to time, place and person     Normal recent and remote memory     Normal Attention span and concentration     Normal Language, naming, repeating,spontaneous speech     Fund of knowledge   CRANIAL NERVES: CN II: Visual fields  are full to confrontation. Pupils are round equal and briskly reactive to light. CN III, IV, VI: extraocular movement are normal. No ptosis. CN V: Facial sensation is intact to light touch CN VII: Face is symmetric with normal eye closure  CN VIII: Hearing is normal to causal conversation. CN IX, X: Phonation is normal. CN XI: Head turning and shoulder shrug are intact  MOTOR: No resting tremor, no weakness, left more right rigidity, bradykinesia  REFLEXES: Reflexes are 2+ and symmetric at the biceps, triceps, knees, and ankles. Plantar responses are flexor.  SENSORY: Intact to light touch, pinprick and vibratory sensation are intact in fingers and toes.  COORDINATION: There is no trunk or limb dysmetria noted.  GAIT/STANCE: Cervical dystonia, tilt to right, moderate arm swing and stride, mildly unsteady  DIAGNOSTIC DATA (LABS, IMAGING, TESTING) - I reviewed patient records, labs, notes, testing and imaging myself where available.   ASSESSMENT AND PLAN  Kimberly Mccullough is a 74 y.o. female   Idiopathic Parkinson's disease  Continue Sinemet 25/100 mg 3 times a day  Encourage moderate exercise Slurred speech soft voice,  Refer to  sleep study Cervical dystonia    Moderate right tilt to right side  This may cause worsening gait abnormality and neck pain, if she began to complains those symptoms, may consider EMG guided BOTOX injection   Levert Feinstein, M.D. Ph.D.  [facility] [address] [place], [place] [address] Ph: [phone] Fax: [phone]  CC:  Kerin Salen, PA-C [address] [place],  [place] [address]

## 2020-04-13 ENCOUNTER — Other Ambulatory Visit: Payer: Self-pay | Admitting: Obstetrics and Gynecology

## 2020-04-13 DIAGNOSIS — M810 Age-related osteoporosis without current pathological fracture: Secondary | ICD-10-CM

## 2020-04-14 ENCOUNTER — Telehealth: Payer: Self-pay | Admitting: Neurology

## 2020-04-14 NOTE — Telephone Encounter (Signed)
Pt states one of her Doctors (who ordered her to have an Endoscopy) is making the suggestion that she doubles up on her intake of pantoprazole (PROTONIX) 40 MG tablet.  Pt is asking for a call to discuss this

## 2020-04-14 NOTE — Telephone Encounter (Signed)
I called the patient back. Dr. Terrace Arabia does not prescribe Protonix. She will follow up with her PCP and surgeon to get further clarification.

## 2020-04-19 ENCOUNTER — Encounter: Payer: Self-pay | Admitting: Nurse Practitioner

## 2020-04-19 ENCOUNTER — Ambulatory Visit: Payer: Medicare Other | Admitting: Nurse Practitioner

## 2020-04-19 ENCOUNTER — Other Ambulatory Visit: Payer: Self-pay

## 2020-04-19 VITALS — BP 140/82 | HR 75 | Ht 69.5 in | Wt 156.2 lb

## 2020-04-19 DIAGNOSIS — R002 Palpitations: Secondary | ICD-10-CM | POA: Diagnosis not present

## 2020-04-19 DIAGNOSIS — I10 Essential (primary) hypertension: Secondary | ICD-10-CM | POA: Diagnosis not present

## 2020-04-19 NOTE — Patient Instructions (Addendum)
After Visit Summary:  We will be checking the following labs today - NONE   Medication Instructions:    Continue with your current medicines.    If you need a refill on your cardiac medications before your next appointment, please call your pharmacy.     Testing/Procedures To Be Arranged:  N/A  Follow-Up:   See Dr. Clifton James in about one year. You will receive a reminder letter in the mail two months in advance. If you don't receive a letter, please call our office to schedule the follow-up appointment.   At [facility], you and your health needs are our priority.  As part of our continuing mission to provide you with exceptional heart care, we have created designated Provider Care Teams.  These Care Teams include your primary Cardiologist (physician) and Advanced Practice Providers (APPs -  Physician Assistants and Nurse Practitioners) who all work together to provide you with the care you need, when you need it.  Special Instructions:  . Stay safe, wash your hands for at least 20 seconds and wear a mask when needed.  . It was good to talk with you today.    Call the [facility] office at [phone] if you have any questions, problems or concerns.

## 2020-05-31 ENCOUNTER — Other Ambulatory Visit: Payer: Self-pay | Admitting: Nurse Practitioner

## 2020-06-09 ENCOUNTER — Ambulatory Visit: Payer: Medicare Other

## 2020-07-19 ENCOUNTER — Ambulatory Visit
Admission: RE | Admit: 2020-07-19 | Discharge: 2020-07-19 | Disposition: A | Discharge status: Home / Self Care | Payer: Medicare Other | Source: Ambulatory Visit | Attending: Obstetrics and Gynecology | Admitting: Obstetrics and Gynecology

## 2020-07-19 ENCOUNTER — Other Ambulatory Visit: Payer: Self-pay

## 2020-07-19 DIAGNOSIS — M810 Age-related osteoporosis without current pathological fracture: Secondary | ICD-10-CM

## 2020-10-18 ENCOUNTER — Ambulatory Visit: Payer: Medicare Other | Admitting: Neurology

## 2020-10-18 ENCOUNTER — Encounter: Payer: Self-pay | Admitting: Neurology

## 2020-10-18 VITALS — BP 147/87 | HR 81 | Ht 69.5 in | Wt 147.0 lb

## 2020-10-18 DIAGNOSIS — G20 Parkinson's disease: Secondary | ICD-10-CM | POA: Diagnosis not present

## 2020-10-18 DIAGNOSIS — G243 Spasmodic torticollis: Secondary | ICD-10-CM | POA: Diagnosis not present

## 2020-10-18 NOTE — Progress Notes (Signed)
Chief Complaint  Patient presents with   Follow-up    New room w/ her son, Iantha Fallen.  Follow up for Parkinson's disease. She has continued Sinemet 25-100mg , one tab TID. Feels her walking has slightly slowed. She has started having vivid nightmares waking her up from sleep.    HISTORY:  Kimberly Mccullough is a 75 year old female, seen in request by her primary care PA Kerin Salen, for evaluation of decreased sense of smell, concerning about Parkinson's disease, initial evaluation was on January 23, 2018.   I have reviewed and summarized the referring note from the referring physician.  She had past medical history of hypertension, status post bilateral knee replacement, left hip replacement.  She lives alone since her husband passed away in 2010/08/25   She noticed gradual onset loss of sense of smell for the past couple years, was noted by her son that she has mild difficulty clear her foot from the floor sometimes, she denies significant tremor, mild unsteady gait she contributed to her joints replacement.  She also was noted to be acting out of dreams, she exercise regularly, she is Jehov[employer]'s Witness,  visiting people's house regularly   Laboratory evaluation in September 2019: Normal CMP, creatinine of 0.88, total cholesterol was 223, TSH 2.9   UPDATE May 26 2018: She has intermittent body twitching, she does exercise regularly, 3 times a week, continue has intermittent head shaking,          I personally reviewed MRI brain in October 2019, mild generalized atrophy, supratentorium small vessel disease, there was no acute abnormality.                                                    UPDATE December 01 2018: She noticed slow worsening gait abnormality, left hand shaking, she was not able to exercise regularly due to COVID-19  UPDATE Apr 12 2020: She is overall doing well with current Sinemet 25/100 mg 3 times daily, but complains of dragging her left leg while walking, she  also complains of excessive dream, vivid dreams, frequent movement during sleep,  Also noticed gradual worsening head tilting towards the right shoulder, but denies significant neck or shoulder pain,  She also complains of weight loss, went through upper and lower GI colonoscopy recently.  She complains of soft voice, especially with prolonged talking,  Update October 18, 2020 SS: Here today accompanied by her son, remains on Sinemet 25/100 mg, TID, 8, 12, 6.  Takes consistently.  Main concern, having vivid nightmares waking her up from sleep, claims hallucinations, that are hard for her to differentiate, can last into the morning.  Lives alone. Referred to speech therapy at last visit for report of soft voice, she declined. Seeing GI for constipation, had colonoscopy, repeat in 5 years. Taking metamucil. Her thoughts are fairly scattered today, jumps from topics.   REVIEW OF SYSTEMS: Full 14 system review of systems performed and notable only for as above.  See HPI  ALLERGIES: Allergies  Allergen Reactions   Statins Other (See Comments)    myalgia   Other     Pt is Jehovas Witness and does not want any blood products Pt is Jehovas Witness and does not want any blood products    HOME MEDICATIONS: Current Outpatient Medications  Medication Sig Dispense Refill   alendronate (FOSAMAX) 70  MG tablet Take by mouth.     aspirin 81 MG tablet Take 81 mg by mouth daily.     calcium carbonate (OSCAL) 1500 (600 Ca) MG TABS tablet Take by mouth.     carbidopa-levodopa (SINEMET) 25-100 MG tablet Take 1 tablet 3 times daily 270 tablet 3   FIBER ADULT GUMMIES PO Take 2 tablets by mouth daily.     Glucosamine-Chondroit-Vit C-Mn (GLUCOSAMINE 1500 COMPLEX) CAPS Take by mouth.     meloxicam (MOBIC) 15 MG tablet Take 15 mg by mouth daily.     Multiple Vitamins-Minerals (MULTIVITAMIN ADULTS PO) Take by mouth.     Omega-3 Fatty Acids (FISH OIL) 1000 MG CAPS Take by mouth.     verapamil (CALAN-SR) 120 MG CR  tablet TAKE 1 TABLET BY MOUTH  TWICE DAILY 180 tablet 3   vitamin E 180 MG (400 UNITS) capsule Take 400 Units by mouth daily.     No current facility-administered medications for this visit.    PAST MEDICAL HISTORY: Past Medical History:  Diagnosis Date   Arthritis    Hyperlipidemia    Hypertension    No blood products    Osteopenia    PTSD (post-traumatic stress disorder)    Rapid heart beat    takes metoprolol to treat / denies HBP   Tinnitus    VERY MILD; DOES NOT AFFECT HEARING     PAST SURGICAL HISTORY: Past Surgical History:  Procedure Laterality Date   ABDOMINAL HYSTERECTOMY     BUNIONECTOMY     BILATERAL   CHOLECYSTECTOMY     JOINT REPLACEMENT     TOTAL HIP ARTHROPLASTY Left 11/11/2013   Procedure: LEFT TOTAL HIP ARTHROPLASTY ANTERIOR APPROACH;  Surgeon: Loanne Drilling, MD;  Location: [facility];  Service: Orthopedics;  Laterality: Left;   TOTAL KNEE ARTHROPLASTY Left 08/16/2014   Procedure: LEFT TOTAL KNEE ARTHROPLASTY;  Surgeon: Ollen Gross, MD;  Location: [facility];  Service: Orthopedics;  Laterality: Left;   TOTAL KNEE ARTHROPLASTY Right 10/22/2016   Procedure: RIGHT TOTAL KNEE ARTHROPLASTY;  Surgeon: Ollen Gross, MD;  Location: [facility];  Service: Orthopedics;  Laterality: Right;    FAMILY HISTORY: Family History  Problem Relation Age of Onset   Dementia Mother    Liver disease Father    CAD Neg Hx     SOCIAL HISTORY: Social History   Socioeconomic History   Marital status: Widowed    Spouse name: Not on file   Number of children: 3   Years of education: Not on file   Highest education level: Not on file  Occupational History   Occupation: Librarian, academic    Employer: [employer]  Tobacco Use   Smoking status: Never   Smokeless tobacco: Never  Substance and Sexual Activity   Alcohol use: No   Drug use: No   Sexual activity: Not Currently  Other Topics Concern   Not on file  Social History Narrative   Not on file   Social Determinants of  Health   Financial Resource Strain: Not on file  Food Insecurity: Not on file  Transportation Needs: Not on file  Physical Activity: Not on file  Stress: Not on file  Social Connections: Not on file  Intimate Partner Violence: Not on file     PHYSICAL EXAM   Vitals:   10/18/20 1059  Weight: 147 lb (66.7 kg)  Height: 5' 9.5" (1.765 m)    Not recorded    Body mass index is 21.4 kg/m.  PHYSICAL EXAMNIATION:  General: The patient is alert and cooperative at the time of the examination. Masking on the face is seen.   Skin: No significant peripheral edema is noted.  Neurologic Exam  Mental status: The patient is alert and oriented x 3 at the time of the examination. Thoughts are scattered, elaborates certain topics, history seems inaccurate at times.   Cranial nerves: Facial symmetry is present. Speech is normal, no aphasia or dysarthria is noted, particularly do not note especially soft voice. Extraocular movements are full. Visual fields are full.  Motor: The patient has good strength in all 4 extremities, no resting tremor noted, mild bradykinesia noted. Head tilt to the right.  Sensory examination: Soft touch sensation is symmetric on the face, arms, and legs.  Coordination: The patient has good finger-nose-finger bilaterally, stands up to do heel to shin but cannot perform  Gait and station: Cannot stand without push off, even with rocking. Decreased arm swing on the left, elbow in slight flexed position, slightly unsteady, but can walk independently, forward leaning, good strides  Reflexes: Deep tendon reflexes are symmetric.  DIAGNOSTIC DATA (LABS, IMAGING, TESTING) - I reviewed patient records, labs, notes, testing and imaging myself where available.  ASSESSMENT AND PLAN  Kimberly Mccullough is a 75 y.o. female    1. Idiopathic Parkinson's disease -Continue Sinemet 25/100 mg 3 times a day -Encouraged moderate exercise  2.  Report of Vivid dreams,  hallucinations, insomnia -Not on dopamine agonist, no other recent medications added  -Recommend starting Melatonin, 5 mg at bedtime consistently, can increase by 5 mg nightly every 3 nights up to 20 mg, if no benefit will let me know, will decrease back to 5 mg, and add in clonazepam 0.25 mg at bedtime, if this isn't helpful may have to consider Seroquel or Nuplazid  3. Slurred speech soft voice -Referred to speech therapy, she declined the referral, reporting a lot going on, her son agrees she could benefit  4. Cervical dystonia   -Moderate right tilt to right side -May consider EMG guided Botox if starts to have worsening gait abnormality or neck pain  -She would like to see Dr. Terrace Arabia in 6 months    I spent 31 minutes of face-to-face and non-face-to-face time with patient.  This included previsit chart review, lab review, study review, order entry, discussing medications, management and follow-up.  Kimberly Kluver, DNP  [facility] [address] [place], [place] [address] [phone]

## 2020-10-18 NOTE — Patient Instructions (Signed)
Continue the Sinemet at current dose Try melatonin 5 mg at bedtime for sleep for 1 week if not better me know  See you back in 6 months

## 2020-10-19 ENCOUNTER — Encounter: Payer: Self-pay | Admitting: Neurology

## 2020-10-25 ENCOUNTER — Telehealth: Payer: Self-pay | Admitting: Neurology

## 2020-10-25 NOTE — Telephone Encounter (Signed)
Pt called, Melatonin is not working. Past three days took of to 15 mg. Would like a call from the nurse.

## 2020-10-26 NOTE — Telephone Encounter (Signed)
I called pt and she has slept till 0400 then tossed and turned.  I relayed that she can go up to 20mg  po qhs if she feels like the 15mg  is not working for her.  She will let know on Tuesday if she feels needs to go to another med, clonazepam.  She verbalized understanding.

## 2020-11-01 NOTE — Telephone Encounter (Signed)
I spoke to the patient. She has only taken melatonin 20mg  QHS twice. Both nights she was able to fall asleep within 20 minutes. Vivid dreams have improved. She is still waking up some through the night. She is willing to continue trying it throughout this week and call back if she is not sleeping better. I also asked about her sleep environment. She often leaves the TV on. I instructed her to cut the TV off, avoid usage of phone, darken her room and make sure it is quiet. She will also try these non-pharmacological suggestions to see if it helps.

## 2020-11-01 NOTE — Telephone Encounter (Signed)
Pt stated she still isn't getting sleep, would like another RX. Please give pt a call.

## 2020-11-07 ENCOUNTER — Telehealth: Payer: Self-pay | Admitting: Neurology

## 2020-11-07 ENCOUNTER — Other Ambulatory Visit: Payer: Self-pay | Admitting: Neurology

## 2020-11-07 NOTE — Telephone Encounter (Signed)
Pt called stating the melatonin is not working she is taking the 20 MG every night but she is not getting sleep, and also says she is having nightmares. Pt requesting a call back.

## 2020-11-08 NOTE — Telephone Encounter (Addendum)
She has tried melatonin 20mg  at bedtime. This has not been helpful for her. Per Sarah's last note, she states if this did not work, to cut the Melatonin back to 5mg  QHS and add clonazepam 0.25mg  at bedtime. I discussed this with patient. She declined the clonazepam prescription. She would rather discuss other OTC options with the pharmacist. She will call back if sleep continues to be a problem.

## 2020-11-10 MED ORDER — CLONAZEPAM 0.5 MG PO TABS: ORAL_TABLET | ORAL | 0 refills | Status: DC

## 2020-11-10 NOTE — Addendum Note (Signed)
Addended by: Glean Salvo on: 11/10/2020 12:22 PM   Modules accepted: Orders

## 2020-11-10 NOTE — Addendum Note (Signed)
Addended by: Lindell Spar C on: 11/10/2020 11:53 AM   Modules accepted: Orders

## 2020-11-10 NOTE — Telephone Encounter (Addendum)
I returned the call to the patient. She would like to try clonazepam 0.25mg , one tablet at bedtime PRN. She will continue to take melatonin 5mg  QHS. Says she is still not sleeping well and wakes up frequently feeling like people have been talking to her. I have spoken to about this patient. If this does not work, she will either try to increase the clonazepam to 0.5mg  or we will schedule an early follow up with Dr. Maralyn Sago. This was the third extended conversation I have completed with the patient in the last two weeks (10-20 minutes in length each time). I reminded her to make sure she is laying down in a dark, quiet environment to help aid in rest. Additionally, to maintain a hopeful approach to new treatments.

## 2020-11-10 NOTE — Telephone Encounter (Signed)
Pt called, Melatonin still not working, can not stay asleep. Would like a call from the nurse.

## 2021-01-10 NOTE — Telephone Encounter (Signed)
ERROR

## 2021-01-17 ENCOUNTER — Other Ambulatory Visit: Payer: Self-pay | Admitting: Neurology

## 2021-01-17 MED ORDER — CLONAZEPAM 0.5 MG PO TABS: ORAL_TABLET | ORAL | 1 refills | Status: AC

## 2021-01-17 NOTE — Telephone Encounter (Signed)
Pt is requesting a refill for clonazePAM (KLONOPIN) 0.5 MG tablet.  Pharmacy: [facility] [phone]

## 2021-01-18 ENCOUNTER — Telehealth: Payer: Self-pay | Admitting: Neurology

## 2021-01-18 NOTE — Telephone Encounter (Signed)
Pt called wanting to know if her clonazePAM (KLONOPIN) 0.5 MG tablet can be ordered in a 90 qt from now on. Please advise.

## 2021-04-01 NOTE — Progress Notes (Signed)
Chart reviewed, agree above plan ?

## 2021-04-20 ENCOUNTER — Ambulatory Visit: Payer: Medicare Other | Admitting: Neurology

## 2021-05-04 ENCOUNTER — Other Ambulatory Visit: Payer: Self-pay

## 2021-05-04 MED ORDER — VERAPAMIL HCL ER 120 MG PO TBCR: 120.0000 mg | EXTENDED_RELEASE_TABLET | Freq: Two times a day (BID) | ORAL | 0 refills | Status: DC

## 2021-06-08 ENCOUNTER — Encounter: Payer: Self-pay | Admitting: Cardiovascular Disease

## 2021-06-16 ENCOUNTER — Other Ambulatory Visit: Payer: Self-pay | Admitting: Cardiovascular Disease
